# Patient Record
Sex: Male | Born: 1945 | Race: White | Hispanic: No | Marital: Married | Smoking: Never smoker
Health system: Southern US, Community
[De-identification: ages and names within clinical notes are randomized; demographics above are authoritative.]

## PROBLEM LIST (undated history)

## (undated) DIAGNOSIS — Z87442 Personal history of urinary calculi: Secondary | ICD-10-CM

## (undated) DIAGNOSIS — T8859XA Other complications of anesthesia, initial encounter: Secondary | ICD-10-CM

## (undated) DIAGNOSIS — M1990 Unspecified osteoarthritis, unspecified site: Secondary | ICD-10-CM

## (undated) DIAGNOSIS — Z960 Presence of urogenital implants: Secondary | ICD-10-CM

## (undated) DIAGNOSIS — R490 Dysphonia: Secondary | ICD-10-CM

## (undated) DIAGNOSIS — I10 Essential (primary) hypertension: Secondary | ICD-10-CM

## (undated) DIAGNOSIS — T4145XA Adverse effect of unspecified anesthetic, initial encounter: Secondary | ICD-10-CM

## (undated) DIAGNOSIS — K219 Gastro-esophageal reflux disease without esophagitis: Secondary | ICD-10-CM

## (undated) HISTORY — PX: PROSTATE SURGERY: SHX751

## (undated) HISTORY — PX: HERNIA REPAIR: SHX51

## (undated) HISTORY — PX: KNEE ARTHROSCOPY: SUR90

## (undated) HISTORY — PX: JOINT REPLACEMENT: SHX530

## (undated) HISTORY — PX: SHOULDER ARTHROSCOPY: SHX128

## (undated) HISTORY — PX: BACK SURGERY: SHX140

## (undated) HISTORY — PX: CYSTOSCOPY W/ URETEROSCOPY W/ LITHOTRIPSY: SUR380

## (undated) HISTORY — PX: SHOULDER ARTHROTOMY: SUR111

---

## 1997-07-24 ENCOUNTER — Other Ambulatory Visit: Admission: RE | Admit: 1997-07-24 | Discharge: 1997-07-24 | Payer: Self-pay | Admitting: Dermatology

## 1999-09-17 ENCOUNTER — Encounter: Admission: RE | Admit: 1999-09-17 | Discharge: 1999-09-17 | Payer: Self-pay | Admitting: General Surgery

## 1999-09-17 ENCOUNTER — Encounter (HOSPITAL_BASED_OUTPATIENT_CLINIC_OR_DEPARTMENT_OTHER): Payer: Self-pay | Admitting: General Surgery

## 1999-09-17 ENCOUNTER — Encounter (INDEPENDENT_AMBULATORY_CARE_PROVIDER_SITE_OTHER): Payer: Self-pay | Admitting: *Deleted

## 1999-09-17 ENCOUNTER — Ambulatory Visit (HOSPITAL_BASED_OUTPATIENT_CLINIC_OR_DEPARTMENT_OTHER): Admission: RE | Admit: 1999-09-17 | Discharge: 1999-09-17 | Payer: Self-pay | Admitting: General Surgery

## 2002-08-15 ENCOUNTER — Emergency Department (HOSPITAL_COMMUNITY): Admission: EM | Admit: 2002-08-15 | Discharge: 2002-08-15 | Payer: Self-pay | Admitting: Emergency Medicine

## 2003-05-24 ENCOUNTER — Inpatient Hospital Stay (HOSPITAL_COMMUNITY): Admission: EM | Admit: 2003-05-24 | Discharge: 2003-05-26 | Payer: Self-pay | Admitting: *Deleted

## 2003-11-21 ENCOUNTER — Ambulatory Visit: Payer: Self-pay | Admitting: Internal Medicine

## 2006-12-17 ENCOUNTER — Emergency Department (HOSPITAL_COMMUNITY): Admission: EM | Admit: 2006-12-17 | Discharge: 2006-12-17 | Payer: Self-pay | Admitting: Emergency Medicine

## 2006-12-27 ENCOUNTER — Ambulatory Visit (HOSPITAL_COMMUNITY): Admission: RE | Admit: 2006-12-27 | Discharge: 2006-12-27 | Payer: Self-pay | Admitting: Urology

## 2010-06-06 NOTE — H&P (Signed)
NAME:  Kevin [name], Kevin [name] NO.:  [id number]   MEDICAL RECORD NO.:  [id number]                   PATIENT TYPE:  INP   LOCATION:  1826                                 FACILITY:  [facility]   PHYSICIAN:  Hewitt Shorts, M.D.            DATE OF BIRTH:  04/17/1945   DATE OF ADMISSION:  05/24/2003  DATE OF DISCHARGE:                                HISTORY & PHYSICAL   HISTORY OF PRESENT ILLNESS:  The patient is a 65 year old right-handed white  male who presents for evaluation of acute low back and right lumbar  radicular pain.  I originally had evaluated the patient 9-1/2 years ago.  He  returned for reevaluation last month.  He has been having difficulties  dating back to the summer of 2004 with low back pain and spasm.  He has  undergone chiropractic treatment, but has continued to have difficulties  that have been essentially recurring episodes of low back pain and spasm.   His difficulties worsened once again this week.  He has contacted our office  several times because of incapacitating back pain and spasm with pain going  into the right buttock and lateral right thigh.  He has been immobilized in  bed, having to use a jar to urinate into.  He has been unable to get up to  go to the bathroom.  Because of the persistent severity of the pain, the  patient was brought by ambulance to the [facility] emergency room  and was evaluated at this time.   His only previous workup has been a CT scan of the lumbar spine and MRI has  been performed as of yet. The CT did show degenerative disk disease and  spondylosis with resulting stenosis at L3-4 and L4-5.   PAST MEDICAL HISTORY:  There is no history of hypertension, myocardial  infarction, cancer, stroke, diabetes, peptic ulcer disease, or lung disease.   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  Herniorrhaphy by Joanne Gavel, M.D. in August of 2002.   ALLERGIES:   No known drug allergies.   MEDICATIONS:  1. Vicodin p.r.n. for pain.  2. Flexeril p.r.n. for muscle spasms.   FAMILY HISTORY:  His father passed on at age 82, he had arthritis and died  of a heart attack. His mother is in poor health at age 23 with a history of  lung cancer.   SOCIAL HISTORY:  The patient is married.  He works as an Scientist, research (medical)  at [employer]. He does not smoke. He does drink alcohol socially.  He  denies history of substance abuse.   REVIEW OF SYSTEMS:  Notable for symptoms described in his history of present  illness and past medical history.  But his review of systems is otherwise  unremarkable.   PHYSICAL EXAMINATION:  GENERAL:  The patient is a well-developed, well-  nourished,  white male in no acute distress.  LUNGS:  Clear to auscultation.  He has symmetric respiratory excursion.  HEART:  Regular rate and rhythm, normal S1 and S2.  No murmur.  ABDOMEN:  Soft and nondistended.  Bowel sounds present.  EXTREMITIES:  No cyanosis, clubbing, or edema.  MUSCULOSKELETAL:  Limitations in mobility in the low back.  NEUROLOGY:  5/5 strength in the dorsiflexor and plantar flexors bilaterally  as well as the left extensor hallicis longus, however, the right extensor  hallicis longus is 4/5.  Sensation is intact to pinprick in the distal lower  extremities.  Reflexes are minimal at the quadriceps and gastrocnemius, but  symmetrical bilaterally.  Toes are downgoing bilaterally.  Gait and stance  were not tested.   IMPRESSION:  Acute low back and right lumbar radicular pain in a patient  with significant degenerative disk disease, spondylosis and stenosis at L3-4  and L4-5.  His only finding on examination is of weakness of the right  extensor hallicis longus.   PLAN:  The patient is being admitted for analgesics and muscle relaxants.  Will also plan on going ahead and obtaining an MRI of the lumbar spine and  be able to give him further  recommendations.                                                Hewitt Shorts, M.D.    RWN/MEDQ  D:  05/24/2003  T:  05/24/2003  Job:  [id number]

## 2010-06-06 NOTE — Op Note (Signed)
NAME:  Kevin [name], Kevin [name]                        ACCOUNT NO.:  [id number]   MEDICAL RECORD NO.:  [id number]                   PATIENT TYPE:  INP   LOCATION:  3018                                 FACILITY:  [facility]   PHYSICIAN:  Hewitt Shorts, M.D.            DATE OF BIRTH:  1945-06-04   DATE OF PROCEDURE:  05/25/2003  DATE OF DISCHARGE:                                 OPERATIVE REPORT   PREOPERATIVE DIAGNOSES:  1. Right L4-5 lumbar disk protrusion.  2. Lumbar degenerative disk disease.  3. Lumbar spondylosis.  4. Lumbar stenosis.   POSTOPERATIVE DIAGNOSES:  1. Right L4-5 lumbar disk protrusion.  2. Lumbar degenerative disk disease.  3. Lumbar spondylosis.  4. Lumbar stenosis.   PROCEDURE:  Right L4-5 lumbar laminotomy and microdiskectomy with  microdissection.   SURGEON:  Hewitt Shorts, M.D.   ASSISTANT:  Payton Doughty, M.D.   ANESTHESIA:  General endotracheal.   INDICATION:  The patient is a 65 year old man, who presented with an acute  right lumbar radiculopathy, was found by MRI scan to have underlying  degenerative disk disease and spondylosis with a resulting stenosis at L3-4  and L4-5, but he had a large central right L4-5 disk herniation, and the  decision was made to proceed with elective laminotomy and microdiskectomy.   DESCRIPTION OF PROCEDURE:  The patient brought to the operating room, placed  under general endotracheal anesthesia.  The patient was turned to a prone  position; lumbar region was prepped with DuraPrep and draped in a sterile  fashion.  The midline was infiltrated with local anesthetic with  epinephrine, and an x-ray was taken and the L4-5 level identified,  and a  midline incision was made, carried down through the subcutaneous tissue with  bipolar electrocautery.  Electrocautery used to maintain hemostasis.  Dissection was carried down to the lumbar fascia which was incised on the  right side of the midline and the paraspinous muscle was  dissected from the  spinous process of the lamina in a subperiosteal fashion.  The L4-5  interlaminar space was identified.  Another x-ray was taken, and then the  microscope was draped and brought onto the field to provide disk  magnification, illumination and visualization, and the remainder of the  decompression was performed using microdissection and microsurgical  technique.  A laminotomy was performed using the Black max drill and  Kerrison punches.  The ligamentum flavum was carefully resected.  We were  able to identify the thecal sac and right L5 nerve root.  These were gently  retracted medially.  Overlying epidural veins were coagulated, and we  identified the disk herniation.  The annulus was incised, and we entered  into the disk space and proceeded with thorough diskectomy.  As we were able  to do this, several free fragments of disk herniation that had migrated  caudally below the level of the disk space were removed, and we were  able to  decompress the exiting nerve root and the thecal sac caudal to the disk  space.  We then, within the disk space, proceeded with a thorough  diskectomy, removing all loose fragments of disk material and in the end,  all the fragments of disk material from both the disk space and the epidural  space were removed, and hemostasis was established with the use of bipolar  cautery.  The wound was irrigated with Bacitracin solution.  Check for  hemostasis was established, confirmed, and then we instilled 2 mL of  fentanyl and 80 mg of Depo-Medrol into the epidural space and proceeded with  closure.  The deep fascia closed with interrupted undyed #1 Vicryl suture,  the deep subcutaneous layer was closed with interrupted inverted 1 undyed  Vicryl sutures, and the subcutaneous and subcuticular were closed with  interrupted inverted 2-0 and 0 Vicryl sutures.  Skin is reapproximated with  Dermabond.  The patient tolerated the procedure well.  The  estimated blood  loss was 25 mL.  Sponge count correct.  Following surgery, the patient was  turned back to a supine position; we reversed anesthetic, extubated, and  transferred to the recovery room for further care.                                               Hewitt Shorts, M.D.    RWN/MEDQ  D:  05/25/2003  T:  05/27/2003  Job:  [id number]

## 2010-06-06 NOTE — Op Note (Signed)
[place]  Patient:    Kevin [name], Kevin [name]                    MRN: [id number] Proc. Date: 09/17/99 Attending:  Marnee Spring. Wiliam Ke, M.D.                           Operative Report  PREOPERATIVE DIAGNOSIS:  Left inguinal hernia.  POSTOPERATIVE DIAGNOSIS:  Left inguinal hernia.  OPERATION PERFORMED:  Repair of left inguinal hernia with Marlex patch and aspirate hydrocele.  SURGEON:  Marnee Spring. Wiliam Ke, M.D.  ASSISTANT:  None.  ANESTHESIA:  Local MAC.  DESCRIPTION OF PROCEDURE:  Under good sedation, the skin of the groin was prepped and draped in the usual manner.  Tissues were infiltrated with a mixture of Xylocaine and Marcaine anesthesia.  A transverse incision was made in the lower abdominal skin fold and deepened through subcutaneous tissue. The external oblique aponeurosis was opened, thus entering the inguinal canal. The ilioinguinal nerve was identified and held aside and spared.  Spermatic cord was mobilized and held aside.  Patient had a large lipoma of the cord and a sizable indirect sac.  They were freed from the spermatic cord.  The lipoma was tied off at its base with 2-0 silk and excised.  The hernia sac was opened and found to be empty and then suture ligated with 2-0 silk and tied with 2-0 silk and the excess sac was excised.  The patient had a sizable direct hernia. This was repaired by fashioning a Marlex patch and sewing this in place.  This was accomplished with two running 2-0 Novofil sutures both starting at the pubic tubercle, one running up the conjoined tendon, one running up the shelving portion of Pouparts ligament.  Both were tied at the internal ring and then a collaring suture of 2-0 Novofil was used.  Hemostasis was excellent.  The external oblique aponeurosis was closed superficial to the cord with running 2-0 Vicryl.  3-0 Vicryl was used in Scarpas fascia.  Skin was closed with subcutaneous 4-0 Vicryl and Steri-Strips were  applied. Estimated blood loss for this procedure was minimal.  At this point the testicle was found to be well in the scrotum and the hydrocele which we had known about before surgery and the patient did not wish operated on was aspirated and 10 cc of waterclear fluid was retrieved.  Dressings were applied.  Estimated blood loss was minimal.  The patient received no blood and left the operating room in satisfactory condition after sponge and needle counts were verified. DD:  09/17/99 TD:  09/17/99 Job: [id number]

## 2010-08-14 ENCOUNTER — Ambulatory Visit (HOSPITAL_BASED_OUTPATIENT_CLINIC_OR_DEPARTMENT_OTHER)
Admission: RE | Admit: 2010-08-14 | Discharge: 2010-08-15 | Disposition: A | Discharge status: Home / Self Care | Payer: 59 | Source: Ambulatory Visit | Attending: Urology | Admitting: Urology

## 2010-08-14 ENCOUNTER — Other Ambulatory Visit: Payer: Self-pay | Admitting: Urology

## 2010-08-14 DIAGNOSIS — Z01812 Encounter for preprocedural laboratory examination: Secondary | ICD-10-CM | POA: Insufficient documentation

## 2010-08-14 DIAGNOSIS — R9431 Abnormal electrocardiogram [ECG] [EKG]: Secondary | ICD-10-CM | POA: Insufficient documentation

## 2010-08-14 DIAGNOSIS — Z01810 Encounter for preprocedural cardiovascular examination: Secondary | ICD-10-CM | POA: Insufficient documentation

## 2010-08-14 DIAGNOSIS — N400 Enlarged prostate without lower urinary tract symptoms: Secondary | ICD-10-CM | POA: Insufficient documentation

## 2010-08-14 LAB — POCT I-STAT 4, (NA,K, GLUC, HGB,HCT)
Glucose, Bld: 105 mg/dL — ABNORMAL HIGH (ref 70–99)
HCT: 48 % (ref 39.0–52.0)
Hemoglobin: 16.3 g/dL (ref 13.0–17.0)
Potassium: 3.8 mEq/L (ref 3.5–5.1)
Sodium: 143 mEq/L (ref 135–145)

## 2010-09-19 NOTE — Op Note (Signed)
NAME:  Kevin, [name] NO.:  [id number]  MEDICAL RECORD NO.:  [id number]  LOCATION:                                 FACILITY:  PHYSICIAN:  Kevin [name] I. Kevin [name], M.D. DATE OF BIRTH:  DATE OF PROCEDURE:  08/14/2010 DATE OF DISCHARGE:                              OPERATIVE REPORT   TIME:  10:10 a.m.  PREOPERATIVE DIAGNOSIS:  Benign prostatic hypertrophy.  POSTOPERATIVE DIAGNOSIS:  Transurethral resection of prostate.  SURGEON:  Kevin [name] I. Kevin Sears, MD  ANESTHESIA:  General LMA.  PREPARATION:  After appropriate preanesthesia, the patient was brought to the operating room and placed on the operating room table in dorsal supine position where general LMA anesthesia was introduced.  He was then replaced in the dorsal lithotomy position where the pubis was prepped with Betadine solution and draped in usual fashion.  REVIEW OF HISTORY:  This 65 year old male has a history of severe bladder outlet obstruction, failing Rapaflo therapy and finasteride therapy.  He has complained of urinary urgency, frequency, with an International Prostate Symptom Score Sheet of 22.  He has been on Uroxatral and Avodart, but this was stopped because of decreased ejaculate volume in 2009.  He is known to have a 70 cc gland, with urodynamics showing a maximum flow rate of 4 cc a second, with a maximum capacity of 348 cc.  Pressure flow study showed that his flow rate was only 2 cc a second with a detrusor pressure of 91 cm of water and a PVR of 238 cc.  After extensive counseling, it was elected to proceed with TURP.  However, because of the patient's concern over ejaculatory problems, both with volume and antegrade ejaculation, we have discussed creating a "trench" and a little TURP as necessary to allow the patient to void.  The patient did have a TUNA procedure in the past but has currently failed that treatment.  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  This was dilated to a size 28-French, and the 28 continuous flow resectoscope was then passed into the urethra and into the bladder.  There was no urethral stricture noted.  A trilobar BPH was identified, with elevated bladder neck.  The bladder itself was trabeculated with cellules.  No bladder diverticula were identified.  There was a bladder stone identified which measured approximately 4 to 5 mm, seen at the base of the bladder and photo-documented.  Clear efflux was seen from both orifices, which were located normally on the trigone.  Resection of the bladder neck was accomplished from the 7 o'clock to the 5 o'clock positions.  A repeat evaluation showed that the patient had a very large left lobe, larger than the right lateral lobe, this was felt to be the most ascending obstructing portion of the prostate, with the bladder neck elevation was relieved.  I felt that the patient needed to have more than simply bladder neck incision, therefore, shaved the left lateral prostate, leaving as much tissue as I felt possible.  Minimal bleeding was noted, and this was cauterized with the electrosurgical unit.  The right lateral lobe was left intact.  The  verumontanum was always intact.  At the end of procedure, chips were evacuated from the bladder and the small stone was also evacuated from the bladder. Minimal bleeding was noted and I placed a 24 Ainsworth catheter with irrigation and mild traction.  The plan will be to remove the Foley catheter in 3 days, and give the patient voiding trial.  I am concerned because the patient may need to have a more complete TURP in the future, eliminating the right lateral lobe and the remaining left lateral lobe.  The patient was given IV acetaminophen, awakened at the beginning of procedure, as well as antibiotic, and a B and O suppository.  At the end of procedure, he was  awakened and taken to the recovery room in excellent condition.     Kevin [name], M.D.     SIT/MEDQ  D:  08/14/2010  T:  08/14/2010  Job:  [id number]  Electronically Signed by Kevin [name] M.D. on 09/19/2010 03:06:03 PM

## 2010-10-27 LAB — BASIC METABOLIC PANEL
BUN: 18
CO2: 28
Calcium: 9
Chloride: 102
Creatinine, Ser: 1.04
GFR calc Af Amer: >60
GFR calc non Af Amer: >60
Glucose, Bld: 106 — ABNORMAL HIGH
Potassium: 3.9
Sodium: 138

## 2010-10-28 LAB — URINALYSIS, ROUTINE W REFLEX MICROSCOPIC
Bilirubin Urine: NEGATIVE
Glucose, UA: NEGATIVE
Ketones, ur: NEGATIVE
Leukocytes, UA: NEGATIVE
Nitrite: NEGATIVE
Protein, ur: NEGATIVE
Specific Gravity, Urine: 1.038 — ABNORMAL HIGH
Urobilinogen, UA: 0.2
pH: 5.5

## 2010-10-28 LAB — URINE MICROSCOPIC-ADD ON

## 2010-10-28 LAB — POCT URINE HEMOGLOBIN
Hgb urine dipstick: POSITIVE — AB
Operator id: 173591

## 2011-03-05 ENCOUNTER — Encounter: Payer: Self-pay | Admitting: Gastroenterology

## 2011-11-19 ENCOUNTER — Other Ambulatory Visit: Payer: Self-pay | Admitting: Dermatology

## 2012-02-15 ENCOUNTER — Encounter: Payer: Self-pay | Admitting: Gastroenterology

## 2013-05-24 ENCOUNTER — Other Ambulatory Visit: Payer: Self-pay | Admitting: Urology

## 2013-05-29 ENCOUNTER — Encounter (HOSPITAL_COMMUNITY): Payer: Self-pay

## 2013-05-29 ENCOUNTER — Ambulatory Visit (HOSPITAL_COMMUNITY)
Admission: RE | Admit: 2013-05-29 | Discharge: 2013-05-29 | Disposition: A | Discharge status: Home / Self Care | Payer: 59 | Source: Ambulatory Visit | Attending: Anesthesiology | Admitting: Anesthesiology

## 2013-05-29 ENCOUNTER — Encounter (HOSPITAL_COMMUNITY)
Admission: RE | Admit: 2013-05-29 | Discharge: 2013-05-29 | Disposition: A | Discharge status: Home / Self Care | Payer: 59 | Source: Ambulatory Visit | Attending: Urology | Admitting: Urology

## 2013-05-29 ENCOUNTER — Encounter (INDEPENDENT_AMBULATORY_CARE_PROVIDER_SITE_OTHER): Payer: Self-pay

## 2013-05-29 ENCOUNTER — Encounter (HOSPITAL_COMMUNITY): Payer: Self-pay | Admitting: Pharmacy Technician

## 2013-05-29 HISTORY — DX: Personal history of urinary calculi: Z87.442

## 2013-05-29 HISTORY — DX: Essential (primary) hypertension: I10

## 2013-05-29 HISTORY — DX: Unspecified osteoarthritis, unspecified site: M19.90

## 2013-05-29 LAB — BASIC METABOLIC PANEL
BUN: 21 mg/dL (ref 6–23)
CO2: 29 mEq/L (ref 19–32)
Calcium: 9.5 mg/dL (ref 8.4–10.5)
Chloride: 102 mEq/L (ref 96–112)
Creatinine, Ser: 0.93 mg/dL (ref 0.50–1.35)
GFR calc Af Amer: >90 mL/min (ref >90)
GFR calc non Af Amer: 86 mL/min — ABNORMAL LOW (ref >90)
Glucose, Bld: 102 mg/dL — ABNORMAL HIGH (ref 70–99)
Potassium: 4.3 mEq/L (ref 3.7–5.3)
Sodium: 142 mEq/L (ref 137–147)

## 2013-05-29 LAB — CBC
HCT: 44.7 % (ref 39.0–52.0)
Hemoglobin: 15.7 g/dL (ref 13.0–17.0)
MCH: 30.4 pg (ref 26.0–34.0)
MCHC: 35.1 g/dL (ref 30.0–36.0)
MCV: 86.5 fL (ref 78.0–100.0)
Platelets: 126 10*3/uL — ABNORMAL LOW (ref 150–400)
RBC: 5.17 MIL/uL (ref 4.22–5.81)
RDW: 13 % (ref 11.5–15.5)
WBC: 6.8 10*3/uL (ref 4.0–10.5)

## 2013-05-29 NOTE — Pre-Procedure Instructions (Addendum)
05-29-13 EKG/ CXR done today. 05-29-13 Note to Dr. Patsi Searsannenbaum office -note labs viewable in Epic.

## 2013-05-29 NOTE — Patient Instructions (Addendum)
20 Clois ComberRobert E Sellin  05/29/2013   Your procedure is scheduled on:   05-30-2013  [facility] and follow signs to Short Stay Center. Arrive at    2:00     PM.  Call this number if you have problems the morning of surgery: [phone]  Or Presurgical Testing [phone](Jenefer Woerner) For Living Will and/or Health Care Power Attorney Forms: please provide copy for your medical record,may bring AM of surgery(Forms should be already notarized -we do not provide this service).(Pt. Has Living Will -will provide AM of 05-30-13).     Do not eat food:After Midnight.  May have clear liquids:up to 6 Hours before arrival. Nothing after : 1000 AM  Clear liquids include soda, tea, black coffee, apple or grape juice, broth.  Take these medicines the morning of surgery with A SIP OF WATER: Cephalexin, AM Acid reflux med   Do not wear jewelry, make-up or nail polish.  Do not wear lotions, powders, or perfumes. You may wear deodorant.  Do not shave 48 hours(2 days) prior to first CHG shower(legs and under arms).(Shaving face and neck okay.)  Do not bring valuables to the hospital.(Hospital is not responsible for lost valuables).  Contacts, dentures or removable bridgework, body piercing, hair pins may not be worn into surgery.  Leave suitcase in the car. After surgery it may be brought to your room.  For patients admitted to the hospital, checkout time is 11:00 AM the day of discharge.(Restricted visitors-Any Persons displaying flu-like symptoms or illness).    Patients discharged the day of surgery will not be allowed to drive home. Must have responsible person with you x 24 hours once discharged.  Name and phone number of your driver: Fatima SangerBeth Chesnut-spouse [phone] cell  Special Instructions: CHG(Chlorhedine 4%-"Hibiclens","Betasept","Aplicare") Shower Use Special Wash: see special instructions.(avoid face and genitals)    Failure to follow these instructions may result in  Cancellation of your surgery.   Patient signature_______________________________________________________

## 2013-05-29 NOTE — Progress Notes (Signed)
05-29-13 1520 -Labs viewable in Epic- note platelets.

## 2013-05-30 ENCOUNTER — Encounter (HOSPITAL_COMMUNITY)
Admission: RE | Disposition: A | Discharge status: Home / Self Care | Payer: Self-pay | Source: Ambulatory Visit | Attending: Urology

## 2013-05-30 ENCOUNTER — Encounter (HOSPITAL_COMMUNITY): Payer: 59 | Admitting: Anesthesiology

## 2013-05-30 ENCOUNTER — Encounter (HOSPITAL_COMMUNITY): Payer: Self-pay | Admitting: *Deleted

## 2013-05-30 ENCOUNTER — Ambulatory Visit (HOSPITAL_COMMUNITY): Payer: 59 | Admitting: Anesthesiology

## 2013-05-30 ENCOUNTER — Emergency Department (HOSPITAL_COMMUNITY)
Admission: EM | Admit: 2013-05-30 | Discharge: 2013-05-30 | Disposition: A | Discharge status: Home / Self Care | Payer: 59 | Attending: Emergency Medicine | Admitting: Emergency Medicine

## 2013-05-30 ENCOUNTER — Ambulatory Visit (HOSPITAL_COMMUNITY)
Admission: RE | Admit: 2013-05-30 | Discharge: 2013-05-30 | Disposition: A | Discharge status: Home / Self Care | Payer: 59 | Source: Ambulatory Visit | Attending: Urology | Admitting: Urology

## 2013-05-30 ENCOUNTER — Encounter (HOSPITAL_COMMUNITY): Payer: Self-pay | Admitting: Emergency Medicine

## 2013-05-30 DIAGNOSIS — M1610 Unilateral primary osteoarthritis, unspecified hip: Secondary | ICD-10-CM | POA: Insufficient documentation

## 2013-05-30 DIAGNOSIS — Y849 Medical procedure, unspecified as the cause of abnormal reaction of the patient, or of later complication, without mention of misadventure at the time of the procedure: Secondary | ICD-10-CM | POA: Insufficient documentation

## 2013-05-30 DIAGNOSIS — N401 Enlarged prostate with lower urinary tract symptoms: Secondary | ICD-10-CM | POA: Insufficient documentation

## 2013-05-30 DIAGNOSIS — N9989 Other postprocedural complications and disorders of genitourinary system: Secondary | ICD-10-CM | POA: Insufficient documentation

## 2013-05-30 DIAGNOSIS — N138 Other obstructive and reflux uropathy: Secondary | ICD-10-CM | POA: Insufficient documentation

## 2013-05-30 DIAGNOSIS — I10 Essential (primary) hypertension: Secondary | ICD-10-CM | POA: Insufficient documentation

## 2013-05-30 DIAGNOSIS — M169 Osteoarthritis of hip, unspecified: Secondary | ICD-10-CM | POA: Insufficient documentation

## 2013-05-30 DIAGNOSIS — R300 Dysuria: Secondary | ICD-10-CM | POA: Insufficient documentation

## 2013-05-30 DIAGNOSIS — IMO0002 Reserved for concepts with insufficient information to code with codable children: Secondary | ICD-10-CM | POA: Insufficient documentation

## 2013-05-30 DIAGNOSIS — N410 Acute prostatitis: Secondary | ICD-10-CM

## 2013-05-30 DIAGNOSIS — R339 Retention of urine, unspecified: Secondary | ICD-10-CM

## 2013-05-30 DIAGNOSIS — Z87891 Personal history of nicotine dependence: Secondary | ICD-10-CM | POA: Insufficient documentation

## 2013-05-30 DIAGNOSIS — N529 Male erectile dysfunction, unspecified: Secondary | ICD-10-CM | POA: Insufficient documentation

## 2013-05-30 DIAGNOSIS — K219 Gastro-esophageal reflux disease without esophagitis: Secondary | ICD-10-CM | POA: Insufficient documentation

## 2013-05-30 DIAGNOSIS — E291 Testicular hypofunction: Secondary | ICD-10-CM | POA: Insufficient documentation

## 2013-05-30 DIAGNOSIS — M19019 Primary osteoarthritis, unspecified shoulder: Secondary | ICD-10-CM | POA: Insufficient documentation

## 2013-05-30 DIAGNOSIS — Z87442 Personal history of urinary calculi: Secondary | ICD-10-CM | POA: Insufficient documentation

## 2013-05-30 DIAGNOSIS — R319 Hematuria, unspecified: Secondary | ICD-10-CM | POA: Insufficient documentation

## 2013-05-30 DIAGNOSIS — R109 Unspecified abdominal pain: Secondary | ICD-10-CM | POA: Insufficient documentation

## 2013-05-30 DIAGNOSIS — R310 Gross hematuria: Secondary | ICD-10-CM | POA: Insufficient documentation

## 2013-05-30 DIAGNOSIS — Z79899 Other long term (current) drug therapy: Secondary | ICD-10-CM | POA: Insufficient documentation

## 2013-05-30 DIAGNOSIS — Z978 Presence of other specified devices: Secondary | ICD-10-CM

## 2013-05-30 DIAGNOSIS — M129 Arthropathy, unspecified: Secondary | ICD-10-CM | POA: Insufficient documentation

## 2013-05-30 DIAGNOSIS — Z792 Long term (current) use of antibiotics: Secondary | ICD-10-CM | POA: Insufficient documentation

## 2013-05-30 HISTORY — DX: Presence of other specified devices: Z97.8

## 2013-05-30 HISTORY — PX: CYSTOSCOPY/RETROGRADE/URETEROSCOPY/STONE EXTRACTION WITH BASKET: SHX5317

## 2013-05-30 LAB — URINE MICROSCOPIC-ADD ON

## 2013-05-30 LAB — I-STAT CHEM 8, ED
BUN: 22 mg/dL (ref 6–23)
Calcium, Ion: 1.13 mmol/L (ref 1.13–1.30)
Chloride: 99 mEq/L (ref 96–112)
Creatinine, Ser: 1.2 mg/dL (ref 0.50–1.35)
Glucose, Bld: 142 mg/dL — ABNORMAL HIGH (ref 70–99)
HCT: 43 % (ref 39.0–52.0)
Hemoglobin: 14.6 g/dL (ref 13.0–17.0)
Potassium: 4.1 mEq/L (ref 3.7–5.3)
Sodium: 138 mEq/L (ref 137–147)
TCO2: 27 mmol/L (ref 0–100)

## 2013-05-30 LAB — URINALYSIS, ROUTINE W REFLEX MICROSCOPIC
Bilirubin Urine: NEGATIVE
Glucose, UA: NEGATIVE mg/dL
Ketones, ur: NEGATIVE mg/dL
Leukocytes, UA: NEGATIVE
Nitrite: NEGATIVE
Protein, ur: 100 mg/dL — AB
Specific Gravity, Urine: 1.012 (ref 1.005–1.030)
Urobilinogen, UA: 0.2 mg/dL (ref 0.0–1.0)
pH: 7 (ref 5.0–8.0)

## 2013-05-30 SURGERY — CYSTOSCOPY, WITH CALCULUS REMOVAL USING BASKET
Anesthesia: General | Site: Bladder

## 2013-05-30 MED ORDER — ATROPINE SULFATE 0.4 MG/ML IJ SOLN
INTRAMUSCULAR | Status: AC
  Filled 2013-05-30: qty 1

## 2013-05-30 MED ORDER — LIDOCAINE HCL 2 % EX GEL
CUTANEOUS | Status: AC
Start: 2013-05-30 — End: 2013-05-30
  Filled 2013-05-30: qty 10

## 2013-05-30 MED ORDER — PROPOFOL 10 MG/ML IV BOLUS
INTRAVENOUS | Status: AC
  Filled 2013-05-30: qty 20

## 2013-05-30 MED ORDER — LACTATED RINGERS IV SOLN: INTRAVENOUS | Status: DC

## 2013-05-30 MED ORDER — EPHEDRINE SULFATE 50 MG/ML IJ SOLN
INTRAMUSCULAR | Status: DC | PRN
  Administered 2013-05-30: 10 mg via INTRAVENOUS

## 2013-05-30 MED ORDER — PROPOFOL 10 MG/ML IV BOLUS
INTRAVENOUS | Status: DC | PRN
  Administered 2013-05-30: 200 mg via INTRAVENOUS
  Administered 2013-05-30: 40 mg via INTRAVENOUS

## 2013-05-30 MED ORDER — CEFAZOLIN SODIUM-DEXTROSE 2-3 GM-% IV SOLR
INTRAVENOUS | Status: AC
  Filled 2013-05-30: qty 50

## 2013-05-30 MED ORDER — LACTATED RINGERS IV SOLN
INTRAVENOUS | Status: DC
  Administered 2013-05-30: 1000 mL via INTRAVENOUS
  Administered 2013-05-30: 17:00:00 via INTRAVENOUS

## 2013-05-30 MED ORDER — OXYCODONE-ACETAMINOPHEN 5-325 MG PO TABS: 1.0000 | ORAL_TABLET | Freq: Four times a day (QID) | ORAL | Status: DC | PRN

## 2013-05-30 MED ORDER — LIDOCAINE HCL 2 % EX GEL
CUTANEOUS | Status: DC | PRN
  Administered 2013-05-30: 1

## 2013-05-30 MED ORDER — MIDAZOLAM HCL 5 MG/5ML IJ SOLN
INTRAMUSCULAR | Status: DC | PRN
  Administered 2013-05-30: 2 mg via INTRAVENOUS

## 2013-05-30 MED ORDER — FENTANYL CITRATE 0.05 MG/ML IJ SOLN: 25.0000 ug | INTRAMUSCULAR | Status: DC | PRN

## 2013-05-30 MED ORDER — PHENYLEPHRINE HCL 10 MG/ML IJ SOLN
INTRAMUSCULAR | Status: DC | PRN
  Administered 2013-05-30: 40 ug via INTRAVENOUS

## 2013-05-30 MED ORDER — MIDAZOLAM HCL 2 MG/2ML IJ SOLN
INTRAMUSCULAR | Status: AC
  Filled 2013-05-30: qty 2

## 2013-05-30 MED ORDER — FENTANYL CITRATE 0.05 MG/ML IJ SOLN
INTRAMUSCULAR | Status: AC
Start: 2013-05-30 — End: 2013-05-30
  Filled 2013-05-30: qty 5

## 2013-05-30 MED ORDER — URIBEL 118 MG PO CAPS: 1.0000 | ORAL_CAPSULE | Freq: Two times a day (BID) | ORAL | Status: DC | PRN

## 2013-05-30 MED ORDER — FENTANYL CITRATE 0.05 MG/ML IJ SOLN
INTRAMUSCULAR | Status: DC | PRN
  Administered 2013-05-30: 50 ug via INTRAVENOUS
  Administered 2013-05-30 (×2): 25 ug via INTRAVENOUS
  Administered 2013-05-30 (×2): 50 ug via INTRAVENOUS

## 2013-05-30 MED ORDER — LIDOCAINE HCL (CARDIAC) 20 MG/ML IV SOLN
INTRAVENOUS | Status: DC | PRN
  Administered 2013-05-30: 100 mg via INTRAVENOUS

## 2013-05-30 MED ORDER — DEXAMETHASONE SODIUM PHOSPHATE 10 MG/ML IJ SOLN
INTRAMUSCULAR | Status: DC | PRN
  Administered 2013-05-30: 10 mg via INTRAVENOUS

## 2013-05-30 MED ORDER — CEFAZOLIN SODIUM-DEXTROSE 2-3 GM-% IV SOLR
2.0000 g | INTRAVENOUS | Status: AC
  Administered 2013-05-30: 2 g via INTRAVENOUS

## 2013-05-30 MED ORDER — ONDANSETRON HCL 4 MG/2ML IJ SOLN
INTRAMUSCULAR | Status: DC | PRN
  Administered 2013-05-30: 4 mg via INTRAVENOUS

## 2013-05-30 MED ORDER — PHENYLEPHRINE 40 MCG/ML (10ML) SYRINGE FOR IV PUSH (FOR BLOOD PRESSURE SUPPORT)
PREFILLED_SYRINGE | INTRAVENOUS | Status: AC
  Filled 2013-05-30: qty 10

## 2013-05-30 MED ORDER — CEPHALEXIN 500 MG PO CAPS: 500.0000 mg | ORAL_CAPSULE | Freq: Two times a day (BID) | ORAL | Status: DC

## 2013-05-30 MED ORDER — BELLADONNA ALKALOIDS-OPIUM 16.2-60 MG RE SUPP
RECTAL | Status: AC
  Filled 2013-05-30: qty 1

## 2013-05-30 MED ORDER — SILODOSIN 8 MG PO CAPS: 8.0000 mg | ORAL_CAPSULE | Freq: Every day | ORAL | Status: DC

## 2013-05-30 SURGICAL SUPPLY — 19 items
BAG URO CATCHER STRL LF (DRAPE) ×3 IMPLANT
BASKET STNLS GEMINI 4WIRE 3FR (BASKET) IMPLANT
BASKET ZERO TIP NITINOL 2.4FR (BASKET) IMPLANT
BSKT STON RTRVL GEM 120X11 3FR (BASKET)
BSKT STON RTRVL ZERO TP 2.4FR (BASKET)
CATH INTERMIT  6FR 70CM (CATHETERS) ×3 IMPLANT
CLOTH BEACON ORANGE TIMEOUT ST (SAFETY) ×3 IMPLANT
DRAPE CAMERA CLOSED 9X96 (DRAPES) ×3 IMPLANT
GLOVE BIOGEL M STRL SZ7.5 (GLOVE) ×3 IMPLANT
GOWN STRL REUS W/TWL XL LVL3 (GOWN DISPOSABLE) ×3 IMPLANT
GUIDEWIRE STR DUAL SENSOR (WIRE) ×3 IMPLANT
IV NS 1000ML (IV SOLUTION) ×3
IV NS 1000ML BAXH (IV SOLUTION) ×1 IMPLANT
KIT ASPIRATION TUBING (SET/KITS/TRAYS/PACK) ×2 IMPLANT
LOOP ANGLED CUTTING 22FR (CUTTING LOOP) ×2 IMPLANT
MANIFOLD NEPTUNE II (INSTRUMENTS) ×3 IMPLANT
PACK CYSTO (CUSTOM PROCEDURE TRAY) ×3 IMPLANT
TUBING CONNECTING 10 (TUBING) ×2 IMPLANT
TUBING CONNECTING 10' (TUBING) ×1

## 2013-05-30 NOTE — ED Notes (Signed)
Leg bag hooked to catheter, given regular size catheter bag to take home also.

## 2013-05-30 NOTE — Discharge Instructions (Signed)
Prostatitis The prostate gland is about the size and shape of a walnut. It is located just below your bladder. It produces one of the components of semen, which is made up of sperm and the fluids that help nourish and transport it out from the testicles. Prostatitis is inflammation of the prostate gland.  There are four types of prostatitis:  Acute bacterial prostatitis This is the least common type of prostatitis. It starts quickly and usually is associated with a bladder infection, high fever, and shaking chills. It can occur at any age.  Chronic bacterial prostatitis This is a persistent bacterial infection in the prostate. It usually develops from repeated acute bacterial prostatitis or acute bacterial prostatitis that was not properly treated. It can occur in men of any age but is most common in middle-aged men whose prostate has begun to enlarge. The symptoms are not as severe as those in acute bacterial prostatitis. Discomfort in the part of your body that is in front of your rectum and below your scrotum (perineum), lower abdomen, or in the head of your penis (glans) may represent your primary discomfort.  Chronic prostatitis (nonbacterial) This is the most common type of prostatitis. It is inflammation of the prostate gland that is not caused by a bacterial infection. The cause is unknown and may be associated with a viral infection or autoimmune disorder.  Prostatodynia (pelvic floor disorder) This is associated with increased muscular tone in the pelvis surrounding the prostate. CAUSES The causes of bacterial prostatitis are bacterial infection. The causes of the other types of prostatitis are unknown.  SYMPTOMS  Symptoms can vary depending upon the type of prostatitis that exists. There can also be overlap in symptoms. Possible symptoms for each type of prostatitis are listed below. Acute Bacterial Prostatitis  Painful urination.  Fever or chills.  Muscle or joint pains.  Low  back pain.  Low abdominal pain.  Inability to empty bladder completely. Chronic Bacterial Prostatitis, Chronic Nonbacterial Prostatitis, and Prostatodynia  Sudden urge to urinate.  Frequent urination.  Difficulty starting urine stream.  Weak urine stream.  Discharge from the urethra.  Dribbling after urination.  Rectal pain.  Pain in the testicles, penis, or tip of the penis.  Pain in the perineum.  Problems with sexual function.  Painful ejaculation.  Bloody semen. DIAGNOSIS  In order to diagnose prostatitis, your health care provider will ask about your symptoms. One or more urine samples will be taken and tested (urinalysis). If the urinalysis result is negative for bacteria, your health care provider may use a finger to feel your prostate (digital rectal exam). This exam helps your health care provider determine if your prostate is swollen and tender. It will also produce a specimen of semen that can be analyzed. TREATMENT  Treatment for prostatitis depends on the cause. If a bacterial infection is the cause, it can be treated with antibiotic medicine. In cases of chronic bacterial prostatitis, the use of antibiotics for up to 1 month or 6 weeks may be necessary. Your health care provider may instruct you to take sitz baths to help relieve pain. A sitz bath is a bath of hot water in which your hips and buttocks are under water. This relaxes the pelvic floor muscles and often helps to relieve the pressure on your prostate. HOME CARE INSTRUCTIONS   Take all medicines as directed by your health care provider.  Take sitz baths as directed by your health care provider. SEEK MEDICAL CARE IF:   Your symptoms   get worse, not better.  You have a fever. SEEK IMMEDIATE MEDICAL CARE IF:   You have chills.  You feel nauseous or vomit.  You feel lightheaded or faint.  You are unable to urinate.  You have blood or blood clots in your urine. Document Released: 01/03/2000  Document Revised: 10/26/2012 Document Reviewed: 07/25/2012 [employer] Patient Information 2014 [employer].  

## 2013-05-30 NOTE — Interval H&P Note (Signed)
History and Physical Interval Note:  05/30/2013 4:02 PM  Kevin Comberobert E [name]  has presented today for surgery, with the diagnosis of BLADDER TUMOR   The various methods of treatment have been discussed with the patient and family. After consideration of risks, benefits and other options for treatment, the patient has consented to  Procedure(s): CYSTOSCOPY WITH POSSIBLE BLADDER BIOPSY AND CLOT EVACUATION  (N/A) as a surgical intervention .  The patient's history has been reviewed, patient examined, no change in status, stable for surgery.  I have reviewed the patient's chart and labs.  Questions were answered to the patient's satisfaction.     Kathi LudwigSigmund I Javier Gell

## 2013-05-30 NOTE — Anesthesia Preprocedure Evaluation (Addendum)
Anesthesia Evaluation  Patient identified by MRN, date of birth, ID band Patient awake    Reviewed: Allergy & Precautions, H&P , NPO status , Patient's Chart, lab work & pertinent test results  Airway Mallampati: II TM Distance: >3 FB Neck ROM: full    Dental no notable dental hx. (+) Teeth Intact, Dental Advisory Given   Pulmonary neg pulmonary ROS,  breath sounds clear to auscultation  Pulmonary exam normal       Cardiovascular Exercise Tolerance: Good hypertension, Pt. on medications Rhythm:regular Rate:Normal     Neuro/Psych negative neurological ROS  negative psych ROS   GI/Hepatic negative GI ROS, Neg liver ROS, GERD-  Controlled and Medicated,  Endo/Other  negative endocrine ROS  Renal/GU negative Renal ROS  negative genitourinary   Musculoskeletal   Abdominal   Peds  Hematology negative hematology ROS (+)   Anesthesia Other Findings   Reproductive/Obstetrics negative OB ROS                          Anesthesia Physical Anesthesia Plan  ASA: II  Anesthesia Plan: General   Post-op Pain Management:    Induction: Intravenous  Airway Management Planned: LMA  Additional Equipment:   Intra-op Plan:   Post-operative Plan:   Informed Consent: I have reviewed the patients History and Physical, chart, labs and discussed the procedure including the risks, benefits and alternatives for the proposed anesthesia with the patient or authorized representative who has indicated his/her understanding and acceptance.   Dental Advisory Given  Plan Discussed with: CRNA and Surgeon  Anesthesia Plan Comments:         Anesthesia Quick Evaluation

## 2013-05-30 NOTE — ED Provider Notes (Signed)
CSN: [id number]     Arrival date & time 05/30/13  2158 History   First MD Initiated Contact with Patient 05/30/13 2216     Chief Complaint  Patient presents with  . Urinary Retention     (Consider location/radiation/quality/duration/timing/severity/associated sxs/prior Treatment) HPI Kevin [name] is a 68 y.o. male who presents to ED with difficulty urinating. States he had a procedure done this morning, by dr. Patsi Searsannenbaum, who performed a cystoscope with 'two clots removed.' pt states he had difficulty urinating after procedure, but states was able to urinate a small amount, and was discharged home. States he has not been able to urinate since. States it has now been close to 4 hrs since last urinating. Pt states he has been drinking lots of fluids and took a pill that is supposed to increase his flow. States feels like he has a large amount of urine in bladder but unable to empty it. Reports bloody urine prior to retention. Reports suprapubic discomfort. Denies any back pain. No nausea, vomiting. No fever or chills. No medications or treatments tried.   Past Medical History  Diagnosis Date  . Hypertension   . History of kidney stones     x1  . Arthritis     osteoarthritis-hips/ shoulders- no issues at present   Past Surgical History  Procedure Laterality Date  . Joint replacement      '00-left/'08 RTHA  . Back surgery      lumbar   . Shoulder arthroscopy Right   . Shoulder arthrotomy Left   . Hernia repair      inguinal  . Prostate surgery Bilateral     7- 8 yrs ago  . Cystoscopy w/ ureteroscopy w/ lithotripsy     No family history on file. History  Substance Use Topics  . Smoking status: Never Smoker   . Smokeless tobacco: Not on file  . Alcohol Use: Yes     Comment: social 1-2 drinks weekly    Review of Systems  Constitutional: Negative for fever and chills.  Respiratory: Negative for cough, chest tightness and shortness of breath.   Cardiovascular: Negative for  chest pain, palpitations and leg swelling.  Gastrointestinal: Positive for abdominal pain. Negative for nausea, vomiting, diarrhea and abdominal distention.  Genitourinary: Positive for hematuria and difficulty urinating. Negative for dysuria, urgency, frequency, discharge, penile swelling, scrotal swelling, penile pain and testicular pain.  Musculoskeletal: Negative for arthralgias and myalgias.  Skin: Negative for rash.  Allergic/Immunologic: Negative for immunocompromised state.  Neurological: Negative for dizziness, weakness, light-headedness, numbness and headaches.  All other systems reviewed and are negative.     Allergies  Review of patient's allergies indicates no known allergies.  Home Medications   Prior to Admission medications   Medication Sig Start Date End Date Taking? Authorizing Provider  cephALEXin (KEFLEX) 500 MG capsule Take 500 mg by mouth 2 (two) times daily.   Yes Historical Provider, MD  esomeprazole (NEXIUM) 40 MG capsule Take 40 mg by mouth every morning.   Yes Historical Provider, MD  glucosamine-chondroitin 500-400 MG tablet Take 1 tablet by mouth 3 (three) times daily.   Yes Historical Provider, MD  lisinopril (PRINIVIL,ZESTRIL) 40 MG tablet Take 40 mg by mouth every morning.   Yes Historical Provider, MD  Meth-Hyo-M Bl-Na Phos-Ph Sal (URIBEL) 118 MG CAPS Take 1 capsule (118 mg total) by mouth 3 times/day as needed-between meals & bedtime. 05/30/13  Yes Sigmund Bridget HartshornI Tannenbaum, MD  Misc Natural Products (WHITE WILLOW BARK PO) Take 1 tablet  by mouth daily.   Yes Historical Provider, MD  naproxen sodium (ANAPROX) 220 MG tablet Take 440 mg by mouth 2 (two) times daily with a meal.   Yes Historical Provider, MD  omega-3 acid ethyl esters (LOVAZA) 1 G capsule Take 1 g by mouth daily.   Yes Historical Provider, MD  ranitidine (ZANTAC) 300 MG tablet Take 300 mg by mouth at bedtime.   Yes Historical Provider, MD  silodosin (RAPAFLO) 8 MG CAPS capsule Take 1 capsule (8 mg  total) by mouth daily with breakfast. 05/30/13  Yes Kathi Ludwig, MD  TURMERIC PO Take 1,000 mg by mouth daily.   Yes Historical Provider, MD  oxyCODONE-acetaminophen (ROXICET) 5-325 MG per tablet Take 1 tablet by mouth every 6 (six) hours as needed for severe pain. 05/30/13   Kathi Ludwig, MD   BP 149/74  Pulse 94  Temp(Src) 98.4 F (36.9 C) (Oral)  Resp 16  SpO2 96% Physical Exam  Nursing note and vitals reviewed. Constitutional: He is oriented to person, place, and time. He appears well-developed and well-nourished. No distress.  HENT:  Head: Normocephalic and atraumatic.  Eyes: Conjunctivae are normal.  Neck: Neck supple.  Cardiovascular: Normal rate, regular rhythm and normal heart sounds.   Pulmonary/Chest: Effort normal. No respiratory distress. He has no wheezes. He has no rales.  Abdominal: Soft. Bowel sounds are normal. He exhibits no distension. There is no tenderness. There is no rebound.  Genitourinary:  Foley in place, draining blood urine  Musculoskeletal: He exhibits no edema.  Neurological: He is alert and oriented to person, place, and time.  Skin: Skin is warm and dry.    ED Course  Procedures (including critical care time) Labs Review Labs Reviewed  URINALYSIS, ROUTINE W REFLEX MICROSCOPIC - Abnormal; Notable for the following:    Color, Urine RED (*)    APPearance TURBID (*)    Hgb urine dipstick LARGE (*)    Protein, ur 100 (*)    All other components within normal limits  I-STAT CHEM 8, ED - Abnormal; Notable for the following:    Glucose, Bld 142 (*)    All other components within normal limits  URINE MICROSCOPIC-ADD ON    Imaging Review Dg Chest 2 View  05/29/2013   CLINICAL DATA:  Preoperative chest radiograph. Bladder tumor removal.  EXAM: CHEST  2 VIEW  COMPARISON:  None available.  FINDINGS: Prominent vessels are present in the left suprahilar region. There is no airspace disease or pleural effusion. Cardiopericardial silhouette  is within normal limits.  IMPRESSION: No active cardiopulmonary disease.   Electronically Signed   By: Andreas Newport M.D.   On: 05/29/2013 14:18     EKG Interpretation None      MDM   Final diagnoses:  Urinary retention  Hematuria   Patient in emergency department with urinary retention after a procedure today. Bladder scan revealed greater than 999 cc of urine in the bladder. A Foley catheter placed. 1 L of bloody urine in Foley bag. Patient is feeling much better. I-STAT chem 8 obtained, unremarkable. Urinalysis showed large hemoglobin and red-colored urine, no signs of infection. Patient is hypertensive otherwise normal vital signs. We'll plan to discharge home with followup with urology tomorrow. A Foley leg bag provided.  Filed Vitals:   05/30/13 2216 05/30/13 2252 05/30/13 2300  BP: 220/109 149/74 135/71  Pulse: 99 94 82  Temp: 98.4 F (36.9 C)    TempSrc: Oral    Resp: 20 16  SpO2: 96% 96% 92%      Lottie Musselatyana A Kazuma Elena, PA-C 05/31/13 0007

## 2013-05-30 NOTE — H&P (Signed)
Reason For Visit Cystoscopy   Active Problems Problems  1. 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 Elevated prostate specific antigen (PSA) (790.93) 3. 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 Gross hematuria (599.71)   Assessed By: Jethro Bolusannenbaum, Antwone Capozzoli (Urology); Last Assessed: 23 May 2013 5. 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 He was last seen by Seward GraterNadine Nguyen, NP on 02/13/13 for 3- day hx of painless intermittent dark blood w/ clots, but now intermittent. First couple of time, urine had cranberry red color but now urine is pink color at the beginning then clears up at the end of the stream, no clots. He had been taking fish oil and herbal and had stopped taking both of these when he first saw gross hematuria. Denies fever/chills/nausea/vomiting. Denies any associate voiding discomfort. He had experienced an episode of gross hematuria post TURP in 2012 but resolved w/o interventions. He used to smoke cigarette on occasions and had quitted smoking 35 yrs ago. Denies hx of exposure to chemical. He does have hx of stone and had undergone right ESWL in 2008.    He just had shoulder surgery for dislocation on 02/09/13.    1) Hx of BPH with LUTS: severe bladder outlet symptoms, failing Rapaflo therapy,  Hx of urgency, frequency, with an international prostate symptom score of 22 on Uroxatral. He also has a history of Avodart complication because of decreased ejaculate volume of 2009. He had a 70cc gland and underwent a bladder neck incision and TUIR of lateral lobe of prostate in 2012 w/ benign path. He has  been voiding well ever since that time.     2) ED, hypogonadism, and dysplastic prostate. He restarted his Androgel following prostate biopsy on 12/22/11 for hx of an elevated PSA of 7.187 on 11/23/11 per Dr. Wylene Simmerisovec. Path showed 1 area of HgPIN, and his elevated psa was thought 2ndary BPH and Testosterone effect. Elevated psa was treated with Cipro to see if this is possibly related to prostatitis. Testopel #16 insertion on 06/05/11 but testosterone was still low 274 during Nov 2014 visit. Therefore, androgel double dose was started and testosterone level improved to 747 during most recent visit in Dec 2014.  11/01/12 PSA 4.73/17%         HCT 44.9%         Hgb 16    Interval hx:    Mr. Kevin [name] returns today w/ c/o .   Past Medical History Problems  1. History of Arthritis (V13.4) 2. 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 History of hypertension (V12.59)  Surgical History Problems  1. History of Back Surgery 2. History of Inguinal Hernia Repair 3. History of Lithotripsy 4. History of Shoulder Surgery 5. History of Shoulder Surgery 6. History of Total Hip Replacement 7. History of Transurethral Resection Of Prostate (TURP)  Current Meds 1. Aleve TABS;  Therapy: (Recorded:26Mar2008) to Recorded 2. AndroGel 40.5 MG/2.5GM (1.62%) Transdermal Gel; apply to AREA AS  DIRECTED;  Therapy: 09Dec2013 to (Evaluate:22Dec2014)  Requested for:  25Jun2014; Last Rx:25Jun2014 Ordered 3. AndroGel Pump 20.25 MG/ACT (1.62%) Transdermal Gel; Apply 4 pumps  daily to upper  arms/chest area;  Therapy: 21Oct2014 to (Last Rx:03Dec2014) Ordered 4. Cialis 20 MG Oral Tablet; TAKE 1 TABLET As Directed;  Therapy: 03Nov2008 to (Last Rx:23Apr2014)  Requested for: 23Apr2014  Ordered 5. Glucosamine CAPS;  Therapy: (Recorded:26Mar2008) to Recorded 6. GNP Fish Oil CAPS;  Therapy: (Recorded:26Mar2008) to Recorded 7. Lisinopril 10 MG Oral Tablet;  Therapy: 08Nov2011 to Recorded 8. Omeprazole 40 MG Oral  Capsule Delayed Release;  Therapy: 09Feb2012 to Recorded 9. Oxycodone-Acetaminophen TABS;  Therapy: (Recorded:26Jan2015) to Recorded 10. TraMADol HCl - 50 MG Oral Tablet; Uses rarely, only prn;   Therapy: 30Mar2012 to Recorded 11. V-R Vitamin E CAPS;   Therapy: (Recorded:26Mar2008) to Recorded  Allergies Medication  1. Ciprofloxacin HCl TABS  Family History Problems  1. No pertinent family history  Social History Problems  1. Alcohol Use 2. Caffeine Use   3 PER DAY 3. Marital History - Currently Married 4. Never A Smoker 5. Occupation:   Civil engineer, contractingWEALTH ADVISOR  Review of Systems  Genitourinary: hematuria, but no urinary frequency and no feelings of urinary urgency.    Vitals Vital Signs [Data Includes: Last 1 Day]  Recorded: 05May2015 10:42AM  Blood Pressure: 223 / 87 Temperature: 98.5 F Heart Rate: 67  Results/Data Urine [Data Includes: Last 1 Day]   05May2015  COLOR YELLOW   APPEARANCE CLEAR   SPECIFIC GRAVITY 1.020   pH 7.0   GLUCOSE NEG mg/dL  BILIRUBIN NEG   KETONE NEG mg/dL  BLOOD LARGE   PROTEIN TRACE mg/dL  UROBILINOGEN 0.2 mg/dL  NITRITE NEG   LEUKOCYTE ESTERASE NEG   SQUAMOUS EPITHELIAL/HPF NONE SEEN   WBC NONE SEEN WBC/hpf  RBC 21-50 RBC/hpf  BACTERIA MODERATE   CRYSTALS NONE SEEN   CASTS NONE SEEN   Selected Results  AU CT-HEMATURIA PROTOCOL 03Feb2015 12:00AM Seward GraterNguyen, Nadine   Test Name Result Flag Reference  AU CT-HEMATURIA PROTOCOL (Report)    ** RADIOLOGY REPORT BY [facility] **   CLINICAL DATA: Gross hematuria.  EXAM: CT ABDOMEN AND PELVIS WITHOUT AND WITH CONTRAST  TECHNIQUE: Multidetector CT imaging of the abdomen and pelvis was performed without contrast material in one or both body regions, followed by contrast material(s) and further sections in one or both body regions.  CONTRAST: 125 cc of Isovue-300  COMPARISON: CT scan dated 12/17/2006  FINDINGS: Precontrast images demonstrate to a 10 mm stone in the mid  right kidney and a 2 mm stone in the upper pole of the left kidney, 2 mm stone in the mid left kidney, and any 7 mm stone in the lower pole of the left kidney. There are no mass lesions. There is no hydronephrosis. The ureters appear normal. The visualized portion of the bladder is normal. The hip prostheses obscure portions of the bladder. The prostate gland is enlarged.  There are several benign appearing cysts in the liver. There is slight hepatic steatosis. Biliary tree, spleen, pancreas, and adrenal glands are normal. There are numerous diverticula in the left side of the colon. Bowel is otherwise normal including the terminal ileum and appendix.  There is no adenopathy. No free air or free fluid. No acute osseous abnormality.  IMPRESSION: 1. Bilateral renal calculi. 2. Enlarged prostate gland. 3. Diverticulosis.   Electronically Signed  By: Geanie CooleyJim Maxwell M.D.  On: 02/21/2013 16:50   Procedure  Procedure: Cystoscopy  Chaperone Present: kim lewis.  Indication: Hematuria.  Informed Consent: Risks, benefits, and potential adverse events were discussed and informed consent was obtained from the patient.  Prep: The patient was prepped  with betadine.  Anesthesia:. Local anesthesia was administered intraurethrally with 2% lidocaine jelly.  Antibiotic prophylaxis:. Keflex.  Procedure Note:  Urethral meatus:. No abnormalities.  Anterior urethra: No abnormalities.  Prostatic urethra:. The lateral prostatic lobes were enlarged. An enlarged intravesical median lobe was visualized.  Bladder: Visulization was clear. Examination of the bladder demonstrated a moderate amount of old clot within the bladder and trabeculation, but no diverticulum. 2 moderate sized organized clots The patient tolerated the procedure well.  Complications: None.    Assessment Assessed  1. Gross hematuria (599.71)  Gross hematuria with organized clots. ? BT underneath. He needs to have clots removed, and  then possible bladder biopsy. OP at [facility].   Plan Gross hematuria  1. Start: Cephalexin 500 MG Oral Capsule (Keflex); TAKE 1 CAPSULE BID Health Maintenance  2. UA With REFLEX; [Do Not Release]; Status:Resulted - Requires  Verification;   Done: 05May2015 10:15AM Hypogonadism, testicular  3. Stop: AndroGel 40.5 MG/2.5GM (1.62%) Transdermal Gel  Schedule surgical intervention for next week.   Signatures Electronically signed by : Jethro Bolus, M.D.; May 23 2013 12:25PM EST

## 2013-05-30 NOTE — Anesthesia Postprocedure Evaluation (Signed)
  Anesthesia Post-op Note  Patient: Kevin [name]  Procedure(s) Performed: Procedure(s) (LRB): CYSTOSCOPY , BLADDER BIOPSY AND CLOT EVACUATION , CAUTERIZATIO OF LATERAL RIGHT AND LEFT LOBE OF PROSTATE (N/A)  Patient Location: PACU  Anesthesia Type: General  Level of Consciousness: awake and alert   Airway and Oxygen Therapy: Patient Spontanous Breathing  Post-op Pain: mild  Post-op Assessment: Post-op Vital signs reviewed, Patient's Cardiovascular Status Stable, Respiratory Function Stable, Patent Airway and No signs of Nausea or vomiting  Last Vitals:  Filed Vitals:   05/30/13 1745  BP: 158/72  Pulse:   Temp: 36.4 C  Resp: 16    Post-op Vital Signs: stable   Complications: No apparent anesthesia complications

## 2013-05-30 NOTE — ED Notes (Signed)
Pt states had 2 blood clots removed from his prostate today, since has voided very little and states the past 2 hours has not voided, bladder scan showed >999, pt uncomfortable in room at this time.

## 2013-05-30 NOTE — Discharge Instructions (Signed)
Drink plenty of fluids. Follow up with urology, call them tomorrow. Return if foley not flowing or if develop fever, worsening pain.    Acute Urinary Retention, Male Acute urinary retention is the temporary inability to urinate. This is a common problem in older men. As men age their prostates become larger and block the flow of urine from the bladder. This is usually a problem that has come on gradually.  HOME CARE INSTRUCTIONS If you are sent home with a Foley catheter and a drainage system, you will need to discuss the best course of action with your health care provider. While the catheter is in, maintain a good intake of fluids. Keep the drainage bag emptied and lower than your catheter. This is so that contaminated urine will not flow back into your bladder, which could lead to a urinary tract infection. There are two main types of drainage bags. One is a large bag that usually is used at night. It has a good capacity that will allow you to sleep through the night without having to empty it. The second type is called a leg bag. It has a smaller capacity, so it needs to be emptied more frequently. However, the main advantage is that it can be attached by a leg strap and can go underneath your clothing, allowing you the freedom to move about or leave your home. Only take over-the-counter or prescription medicines for pain, discomfort, or fever as directed by your health care provider.  SEEK MEDICAL CARE IF:  You develop a low-grade fever.  You experience spasms or leakage of urine with the spasms. SEEK IMMEDIATE MEDICAL CARE IF:   You develop chills or fever.  Your catheter stops draining urine.  Your catheter falls out.  You start to develop increased bleeding that does not respond to rest and increased fluid intake. MAKE SURE YOU:  Understand these instructions.  Will watch your condition.  Will get help right away if you are not doing well or get worse. Document Released:  04/13/2000 Document Revised: 09/07/2012 Document Reviewed: 06/16/2012 [facility] Patient Information 2014 [employer] [place].

## 2013-05-30 NOTE — Op Note (Signed)
Pre-operative diagnosis :   Gross hematuria, BPH.  Postoperative diagnosis:  Same plus prostatitis  Operation:  Cystourethroscopy, evacuation of clot in the bladder and prostatic fossa, right lateral lobe prostate biopsy, cauterization of right and left lateral lobe the prostate.  Surgeon:  Kathie RhodesS. Patsi Searsannenbaum, MD  First assistant:  None  Anesthesia:  General  LMA  Preparation:  After appropriate preanesthesia, the patient was brought to the operating room, placed on the operating table in the dorsal supine position where general LMA anesthesia was introduced. He was then replaced in the dorsal lithotomy position with pubis was prepped with Betadine solution and draped in usual fashion. The armband was double checked. The history was double checked.  Review history:  Problems  1. Benign prostatic hyperplasia with urinary obstruction (600.01,599.69)   Assessed By: Jethro Bolusannenbaum, Xandra Laramee (Urology); Last Assessed: 11 May 2012  2. Elevated prostate specific antigen (PSA) (790.93)  3. 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 Gross hematuria (599.71)   Assessed By: Jethro Bolusannenbaum, Mahmoud Blazejewski (Urology); Last Assessed: 23 May 2013  5. 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 He was last seen by Seward GraterNadine Nguyen, NP on 02/13/13 for 3- day hx of painless intermittent dark blood w/ clots, but now intermittent. First couple of time, urine had cranberry red color but now urine is pink color at the beginning then clears up at the end of the stream, no clots. He had been taking fish oil and herbal and had stopped taking both of these when he first saw gross hematuria. Denies fever/chills/nausea/vomiting. Denies any associate voiding discomfort. He had  experienced an episode of gross hematuria post TURP in 2012 but resolved w/o interventions. He used to smoke cigarette on occasions and had quitted smoking 35 yrs ago. Denies hx of exposure to chemical. He does have hx of stone and had undergone right ESWL in 2008.   Statement of  Likelihood of Success: Excellent. TIME-OUT observed.:  Procedure:  The urethra was dilated to a size 7928 [place] with the male sounds. Fundus the 28 continuous flow sheath was placed within the bladder. A moderate amount of fresh and old clot was identified, and irrigated free from the bladder. No bladder tumor was identified. Trabeculation and some formation was identified. The patient was status post TUIP, and this was well-healed. Edema and inflammatory polyps of the right and left lateral lobe the prostate were identified, with photodocumentation of inflammatory polyps. Biopsy of the right lateral lobe the prostate was obtained, and cauterization of the right and left prostate lobes was accomplished. No bleeding was noted. I elected to not leave the Foley catheter, in order to seat the patient was able to void in the recovery room. The bladder is drained of fluid. Xylocaine gel was placed in the ureter. The patient was awakened and taken to recovery room in good condition.    Time out for the specimen was accomplished and the specimen was handed off appropriately.

## 2013-05-30 NOTE — Transfer of Care (Signed)
Immediate Anesthesia Transfer of Care Note  Patient: Clois ComberRobert E Hoback  Procedure(s) Performed: Procedure(s) (LRB): CYSTOSCOPY WITH POSSIBLE BLADDER BIOPSY AND CLOT EVACUATION  (N/A)  Patient Location: PACU  Anesthesia Type: General  Level of Consciousness: sedated, patient cooperative and responds to stimulation  Airway & Oxygen Therapy: Patient Spontanous Breathing and Patient connected to face mask oxgen  Post-op Assessment: Report given to PACU RN and Post -op Vital signs reviewed and stable  Post vital signs: Reviewed and stable  Complications: No apparent anesthesia complications

## 2013-05-31 ENCOUNTER — Encounter (HOSPITAL_COMMUNITY): Payer: Self-pay | Admitting: Urology

## 2013-05-31 NOTE — ED Provider Notes (Signed)
Medical screening examination/treatment/procedure(s) were performed by non-physician practitioner and as supervising physician I was immediately available for consultation/collaboration.   Skarlett Sedlacek T Adalaide Jaskolski, MD 05/31/13 2322 

## 2013-06-01 ENCOUNTER — Encounter (HOSPITAL_BASED_OUTPATIENT_CLINIC_OR_DEPARTMENT_OTHER): Admission: RE | Payer: Self-pay | Source: Ambulatory Visit

## 2013-06-01 ENCOUNTER — Ambulatory Visit (HOSPITAL_BASED_OUTPATIENT_CLINIC_OR_DEPARTMENT_OTHER): Admission: RE | Admit: 2013-06-01 | Payer: 59 | Source: Ambulatory Visit | Admitting: Urology

## 2013-06-01 SURGERY — CYSTOSCOPY, WITH BIOPSY
Anesthesia: General

## 2013-06-06 ENCOUNTER — Other Ambulatory Visit: Payer: Self-pay | Admitting: Urology

## 2013-06-06 ENCOUNTER — Encounter (HOSPITAL_COMMUNITY): Payer: Self-pay | Admitting: Pharmacy Technician

## 2013-06-07 ENCOUNTER — Encounter (HOSPITAL_COMMUNITY): Payer: Self-pay | Admitting: *Deleted

## 2013-06-07 NOTE — Progress Notes (Signed)
PREOP INSTRUCTIONS AND CHLORHEXIDINE INSTRUCTIONS / PRECAUTIONS REVIEWED WITH PT BY PHONE.  PT JUST HAD SURGERY 05/30/13 - PT STATES NO CHANGES IN HIS HEALTH HX OTHER THAN HE HAS FOLEY CATHETER.  CBC, BMET, EKG, CXR REPORTS FROM 05/29/13 AND I STAT CHEM 8 REPORT 05/30/13 ARE IN EPIC.  NO OTHER LABS NEEDED PER ANESTHESIOLOGIST GUIDELINES FOR NEW SURGERY DATE ON 06/09/13.

## 2013-06-08 NOTE — Anesthesia Preprocedure Evaluation (Addendum)
Anesthesia Evaluation  Patient identified by MRN, date of birth, ID band Patient awake    Reviewed: Allergy & Precautions, H&P , NPO status , Patient's Chart, lab work & pertinent test results  Airway Mallampati: II TM Distance: >3 FB Neck ROM: full    Dental  (+) Teeth Intact, Dental Advisory Given   Pulmonary neg pulmonary ROS,  breath sounds clear to auscultation  Pulmonary exam normal       Cardiovascular Exercise Tolerance: Good hypertension, Pt. on medications negative cardio ROS  Rhythm:regular Rate:Normal     Neuro/Psych Back surgery negative neurological ROS  negative psych ROS   GI/Hepatic negative GI ROS, Neg liver ROS, GERD-  Medicated and Controlled,  Endo/Other  negative endocrine ROS  Renal/GU negative Renal ROS  negative genitourinary   Musculoskeletal   Abdominal   Peds  Hematology negative hematology ROS (+)   Anesthesia Other Findings   Reproductive/Obstetrics negative OB ROS                          Anesthesia Physical Anesthesia Plan  ASA: II  Anesthesia Plan: General   Post-op Pain Management:    Induction: Intravenous  Airway Management Planned: LMA  Additional Equipment:   Intra-op Plan:   Post-operative Plan:   Informed Consent: I have reviewed the patients History and Physical, chart, labs and discussed the procedure including the risks, benefits and alternatives for the proposed anesthesia with the patient or authorized representative who has indicated his/her understanding and acceptance.   Dental Advisory Given  Plan Discussed with: CRNA and Surgeon  Anesthesia Plan Comments:         Anesthesia Quick Evaluation

## 2013-06-09 ENCOUNTER — Encounter (HOSPITAL_COMMUNITY): Payer: 59 | Admitting: Anesthesiology

## 2013-06-09 ENCOUNTER — Inpatient Hospital Stay (HOSPITAL_COMMUNITY)
Admission: RE | Admit: 2013-06-09 | Discharge: 2013-06-13 | DRG: 714 | Disposition: A | Discharge status: Home / Self Care | Payer: 59 | Source: Ambulatory Visit | Attending: Urology | Admitting: Urology

## 2013-06-09 ENCOUNTER — Inpatient Hospital Stay (HOSPITAL_COMMUNITY): Payer: 59 | Admitting: Anesthesiology

## 2013-06-09 ENCOUNTER — Encounter (HOSPITAL_COMMUNITY): Payer: Self-pay | Admitting: *Deleted

## 2013-06-09 ENCOUNTER — Encounter (HOSPITAL_COMMUNITY)
Admission: RE | Disposition: A | Discharge status: Home / Self Care | Payer: Self-pay | Source: Ambulatory Visit | Attending: Urology

## 2013-06-09 DIAGNOSIS — N401 Enlarged prostate with lower urinary tract symptoms: Principal | ICD-10-CM | POA: Diagnosis present

## 2013-06-09 DIAGNOSIS — Z87442 Personal history of urinary calculi: Secondary | ICD-10-CM

## 2013-06-09 DIAGNOSIS — I10 Essential (primary) hypertension: Secondary | ICD-10-CM | POA: Diagnosis present

## 2013-06-09 DIAGNOSIS — N139 Obstructive and reflux uropathy, unspecified: Secondary | ICD-10-CM | POA: Diagnosis present

## 2013-06-09 DIAGNOSIS — E291 Testicular hypofunction: Secondary | ICD-10-CM | POA: Diagnosis present

## 2013-06-09 DIAGNOSIS — K219 Gastro-esophageal reflux disease without esophagitis: Secondary | ICD-10-CM | POA: Diagnosis present

## 2013-06-09 DIAGNOSIS — Z87891 Personal history of nicotine dependence: Secondary | ICD-10-CM

## 2013-06-09 DIAGNOSIS — N400 Enlarged prostate without lower urinary tract symptoms: Secondary | ICD-10-CM | POA: Diagnosis present

## 2013-06-09 DIAGNOSIS — N138 Other obstructive and reflux uropathy: Principal | ICD-10-CM | POA: Diagnosis present

## 2013-06-09 DIAGNOSIS — R339 Retention of urine, unspecified: Secondary | ICD-10-CM | POA: Diagnosis present

## 2013-06-09 DIAGNOSIS — Z96649 Presence of unspecified artificial hip joint: Secondary | ICD-10-CM

## 2013-06-09 HISTORY — PX: TRANSURETHRAL RESECTION OF PROSTATE: SHX73

## 2013-06-09 HISTORY — DX: Presence of urogenital implants: Z96.0

## 2013-06-09 LAB — MRSA PCR SCREENING: MRSA by PCR: NEGATIVE

## 2013-06-09 SURGERY — TURP (TRANSURETHRAL RESECTION OF PROSTATE)
Anesthesia: General

## 2013-06-09 MED ORDER — LISINOPRIL 40 MG PO TABS
40.0000 mg | ORAL_TABLET | Freq: Every morning | ORAL | Status: DC
  Administered 2013-06-10 – 2013-06-13 (×4): 40 mg via ORAL
  Filled 2013-06-09 (×4): qty 1

## 2013-06-09 MED ORDER — BELLADONNA ALKALOIDS-OPIUM 16.2-60 MG RE SUPP: 1.0000 | Freq: Once | RECTAL | Status: DC

## 2013-06-09 MED ORDER — GLUCOSAMINE-CHONDROITIN 500-400 MG PO TABS: 1.0000 | ORAL_TABLET | Freq: Three times a day (TID) | ORAL | Status: DC

## 2013-06-09 MED ORDER — FENTANYL CITRATE 0.05 MG/ML IJ SOLN
INTRAMUSCULAR | Status: AC
  Filled 2013-06-09: qty 2

## 2013-06-09 MED ORDER — HYDROMORPHONE HCL PF 1 MG/ML IJ SOLN
0.5000 mg | INTRAMUSCULAR | Status: DC | PRN
  Administered 2013-06-10 – 2013-06-11 (×2): 1 mg via INTRAVENOUS
  Filled 2013-06-09 (×2): qty 1

## 2013-06-09 MED ORDER — OXYBUTYNIN CHLORIDE 5 MG PO TABS
5.0000 mg | ORAL_TABLET | Freq: Three times a day (TID) | ORAL | Status: DC | PRN
  Administered 2013-06-09 – 2013-06-12 (×6): 5 mg via ORAL
  Filled 2013-06-09 (×6): qty 1

## 2013-06-09 MED ORDER — HYDROMORPHONE HCL PF 1 MG/ML IJ SOLN
0.2500 mg | INTRAMUSCULAR | Status: DC | PRN
  Administered 2013-06-09 (×2): 0.5 mg via INTRAVENOUS

## 2013-06-09 MED ORDER — BELLADONNA ALKALOIDS-OPIUM 16.2-60 MG RE SUPP
RECTAL | Status: AC
  Filled 2013-06-09: qty 1

## 2013-06-09 MED ORDER — DIAZEPAM 5 MG PO TABS
5.0000 mg | ORAL_TABLET | Freq: Once | ORAL | Status: AC | PRN
  Administered 2013-06-09: 5 mg via ORAL
  Filled 2013-06-09: qty 1

## 2013-06-09 MED ORDER — DIPHENHYDRAMINE HCL 12.5 MG/5ML PO ELIX: 12.5000 mg | ORAL_SOLUTION | Freq: Four times a day (QID) | ORAL | Status: DC | PRN

## 2013-06-09 MED ORDER — ONDANSETRON HCL 4 MG/2ML IJ SOLN: 4.0000 mg | INTRAMUSCULAR | Status: DC | PRN

## 2013-06-09 MED ORDER — PHENOL 1.4 % MT LIQD
1.0000 | OROMUCOSAL | Status: DC | PRN
  Filled 2013-06-09: qty 177

## 2013-06-09 MED ORDER — VANCOMYCIN HCL IN DEXTROSE 1-5 GM/200ML-% IV SOLN
1000.0000 mg | Freq: Once | INTRAVENOUS | Status: AC
  Administered 2013-06-09: 1000 mg via INTRAVENOUS

## 2013-06-09 MED ORDER — MENTHOL 3 MG MT LOZG
1.0000 | LOZENGE | OROMUCOSAL | Status: DC | PRN
  Administered 2013-06-09: 3 mg via ORAL
  Filled 2013-06-09: qty 9

## 2013-06-09 MED ORDER — FENTANYL CITRATE 0.05 MG/ML IJ SOLN
INTRAMUSCULAR | Status: DC | PRN
  Administered 2013-06-09 (×3): 50 ug via INTRAVENOUS

## 2013-06-09 MED ORDER — BELLADONNA ALKALOIDS-OPIUM 16.2-60 MG RE SUPP
RECTAL | Status: DC | PRN
  Administered 2013-06-09: 1 via RECTAL

## 2013-06-09 MED ORDER — DEXAMETHASONE SODIUM PHOSPHATE 10 MG/ML IJ SOLN
INTRAMUSCULAR | Status: DC | PRN
  Administered 2013-06-09: 10 mg via INTRAVENOUS

## 2013-06-09 MED ORDER — MIDAZOLAM HCL 5 MG/5ML IJ SOLN
INTRAMUSCULAR | Status: DC | PRN
  Administered 2013-06-09: 2 mg via INTRAVENOUS

## 2013-06-09 MED ORDER — SODIUM CHLORIDE 0.9 % IR SOLN
3000.0000 mL | Status: DC
  Administered 2013-06-09 – 2013-06-11 (×9): 3000 mL

## 2013-06-09 MED ORDER — HYDROMORPHONE HCL PF 1 MG/ML IJ SOLN
0.2500 mg | INTRAMUSCULAR | Status: DC | PRN
  Administered 2013-06-09 (×4): 0.5 mg via INTRAVENOUS

## 2013-06-09 MED ORDER — DOXYCYCLINE HYCLATE 100 MG PO TABS
100.0000 mg | ORAL_TABLET | Freq: Two times a day (BID) | ORAL | Status: DC
  Administered 2013-06-09 – 2013-06-13 (×9): 100 mg via ORAL
  Filled 2013-06-09 (×10): qty 1

## 2013-06-09 MED ORDER — FAMOTIDINE 10 MG PO TABS
10.0000 mg | ORAL_TABLET | Freq: Every day | ORAL | Status: DC
  Administered 2013-06-09 – 2013-06-13 (×5): 10 mg via ORAL
  Filled 2013-06-09 (×5): qty 1

## 2013-06-09 MED ORDER — OXYCODONE-ACETAMINOPHEN 5-325 MG PO TABS: 1.0000 | ORAL_TABLET | Freq: Four times a day (QID) | ORAL | Status: DC | PRN

## 2013-06-09 MED ORDER — LACTATED RINGERS IV SOLN
INTRAVENOUS | Status: DC | PRN
  Administered 2013-06-09 (×2): via INTRAVENOUS

## 2013-06-09 MED ORDER — HYDROMORPHONE HCL PF 1 MG/ML IJ SOLN
INTRAMUSCULAR | Status: AC
  Filled 2013-06-09: qty 1

## 2013-06-09 MED ORDER — NAPROXEN SODIUM 550 MG PO TABS
550.0000 mg | ORAL_TABLET | Freq: Two times a day (BID) | ORAL | Status: DC
  Administered 2013-06-10 (×2): 550 mg via ORAL
  Filled 2013-06-09 (×6): qty 1

## 2013-06-09 MED ORDER — SODIUM CHLORIDE 0.9 % IR SOLN
Status: DC | PRN
  Administered 2013-06-09: 15000 mL

## 2013-06-09 MED ORDER — BISACODYL 10 MG RE SUPP: 10.0000 mg | Freq: Every day | RECTAL | Status: DC | PRN

## 2013-06-09 MED ORDER — LACTATED RINGERS IV SOLN: INTRAVENOUS | Status: DC

## 2013-06-09 MED ORDER — MIDAZOLAM HCL 2 MG/2ML IJ SOLN
INTRAMUSCULAR | Status: AC
  Filled 2013-06-09: qty 2

## 2013-06-09 MED ORDER — DOXYCYCLINE HYCLATE 50 MG PO CAPS: 100.0000 mg | ORAL_CAPSULE | Freq: Two times a day (BID) | ORAL | Status: DC

## 2013-06-09 MED ORDER — OMEGA-3-ACID ETHYL ESTERS 1 G PO CAPS
1.0000 g | ORAL_CAPSULE | Freq: Every day | ORAL | Status: DC
  Administered 2013-06-09 – 2013-06-10 (×2): 1 g via ORAL
  Filled 2013-06-09 (×3): qty 1

## 2013-06-09 MED ORDER — CEFAZOLIN SODIUM-DEXTROSE 2-3 GM-% IV SOLR
2.0000 g | INTRAVENOUS | Status: AC
  Administered 2013-06-09: 2 g via INTRAVENOUS

## 2013-06-09 MED ORDER — ONDANSETRON HCL 4 MG/2ML IJ SOLN
INTRAMUSCULAR | Status: AC
  Filled 2013-06-09: qty 2

## 2013-06-09 MED ORDER — URELLE 81 MG PO TABS
1.0000 | ORAL_TABLET | Freq: Four times a day (QID) | ORAL | Status: DC | PRN
  Filled 2013-06-09: qty 1

## 2013-06-09 MED ORDER — EPHEDRINE SULFATE 50 MG/ML IJ SOLN
INTRAMUSCULAR | Status: DC | PRN
  Administered 2013-06-09: 10 mg via INTRAVENOUS
  Administered 2013-06-09: 5 mg via INTRAVENOUS
  Administered 2013-06-09: 10 mg via INTRAVENOUS

## 2013-06-09 MED ORDER — NAPROXEN SODIUM 275 MG PO TABS: 440.0000 mg | ORAL_TABLET | Freq: Two times a day (BID) | ORAL | Status: DC

## 2013-06-09 MED ORDER — DEXAMETHASONE SODIUM PHOSPHATE 10 MG/ML IJ SOLN
INTRAMUSCULAR | Status: AC
  Filled 2013-06-09: qty 1

## 2013-06-09 MED ORDER — PROPOFOL 10 MG/ML IV BOLUS
INTRAVENOUS | Status: AC
  Filled 2013-06-09: qty 20

## 2013-06-09 MED ORDER — BACITRACIN-NEOMYCIN-POLYMYXIN 400-5-5000 EX OINT: 1.0000 "application " | TOPICAL_OINTMENT | Freq: Three times a day (TID) | CUTANEOUS | Status: DC | PRN

## 2013-06-09 MED ORDER — SENNOSIDES-DOCUSATE SODIUM 8.6-50 MG PO TABS
2.0000 | ORAL_TABLET | Freq: Every day | ORAL | Status: DC
Start: 2013-06-09 — End: 2013-06-13
  Administered 2013-06-11 – 2013-06-12 (×2): 2 via ORAL
  Filled 2013-06-09 (×5): qty 2

## 2013-06-09 MED ORDER — LIDOCAINE HCL (CARDIAC) 20 MG/ML IV SOLN
INTRAVENOUS | Status: DC | PRN
  Administered 2013-06-09: 100 mg via INTRAVENOUS

## 2013-06-09 MED ORDER — URIBEL 118 MG PO CAPS: 1.0000 | ORAL_CAPSULE | Freq: Four times a day (QID) | ORAL | Status: DC | PRN

## 2013-06-09 MED ORDER — HYDROMORPHONE HCL PF 1 MG/ML IJ SOLN
0.5000 mg | INTRAMUSCULAR | Status: DC | PRN
  Administered 2013-06-09: 0.5 mg via INTRAVENOUS

## 2013-06-09 MED ORDER — VANCOMYCIN HCL IN DEXTROSE 1-5 GM/200ML-% IV SOLN
INTRAVENOUS | Status: AC
  Filled 2013-06-09: qty 200

## 2013-06-09 MED ORDER — PROPOFOL 10 MG/ML IV BOLUS
INTRAVENOUS | Status: DC | PRN
  Administered 2013-06-09: 200 mg via INTRAVENOUS

## 2013-06-09 MED ORDER — LIDOCAINE HCL (CARDIAC) 20 MG/ML IV SOLN
INTRAVENOUS | Status: AC
  Filled 2013-06-09: qty 5

## 2013-06-09 MED ORDER — CEFAZOLIN SODIUM-DEXTROSE 2-3 GM-% IV SOLR
INTRAVENOUS | Status: AC
  Filled 2013-06-09: qty 50

## 2013-06-09 MED ORDER — PANTOPRAZOLE SODIUM 40 MG PO TBEC
40.0000 mg | DELAYED_RELEASE_TABLET | Freq: Every day | ORAL | Status: DC
  Administered 2013-06-10 – 2013-06-13 (×4): 40 mg via ORAL
  Filled 2013-06-09 (×5): qty 1

## 2013-06-09 MED ORDER — DIPHENHYDRAMINE HCL 50 MG/ML IJ SOLN: 12.5000 mg | Freq: Four times a day (QID) | INTRAMUSCULAR | Status: DC | PRN

## 2013-06-09 MED ORDER — OXYCODONE-ACETAMINOPHEN 5-325 MG PO TABS
1.0000 | ORAL_TABLET | ORAL | Status: DC | PRN
  Administered 2013-06-12: 1 via ORAL
  Filled 2013-06-09: qty 1

## 2013-06-09 MED ORDER — ONDANSETRON HCL 4 MG/2ML IJ SOLN
INTRAMUSCULAR | Status: DC | PRN
  Administered 2013-06-09: 4 mg via INTRAVENOUS

## 2013-06-09 MED ORDER — SODIUM CHLORIDE 0.45 % IV SOLN
INTRAVENOUS | Status: DC
  Administered 2013-06-09 – 2013-06-11 (×3): via INTRAVENOUS

## 2013-06-09 SURGICAL SUPPLY — 31 items
BAG URINE DRAINAGE (UROLOGICAL SUPPLIES) ×2 IMPLANT
BAG URO CATCHER STRL LF (DRAPE) ×3 IMPLANT
BLADE SURG 15 STRL LF DISP TIS (BLADE) IMPLANT
BLADE SURG 15 STRL SS (BLADE)
CATH AINSWORTH 30CC 24FR (CATHETERS) IMPLANT
CATH FOLEY 3WAY 30CC 24FR (CATHETERS)
CATH HEMA 3WAY 30CC 22FR COUDE (CATHETERS) ×2 IMPLANT
CATH URO 16X24FR 3W FL PS (CATHETERS) IMPLANT
DRAPE CAMERA CLOSED 9X96 (DRAPES) ×3 IMPLANT
ELECT BUTTON HF 24-28F 2 30DE (ELECTRODE) ×1 IMPLANT
ELECT LOOP MED HF 24F 12D (CUTTING LOOP) ×1 IMPLANT
ELECT LOOP MED HF 24F 12D CBL (CLIP) ×3 IMPLANT
ELECT RESECT VAPORIZE 12D CBL (ELECTRODE) IMPLANT
GLOVE BIOGEL M STRL SZ7.5 (GLOVE) ×9 IMPLANT
GOWN STRL REUS W/TWL LRG LVL3 (GOWN DISPOSABLE) ×3 IMPLANT
GOWN STRL REUS W/TWL XL LVL3 (GOWN DISPOSABLE) ×3 IMPLANT
HOLDER FOLEY CATH W/STRAP (MISCELLANEOUS) ×2 IMPLANT
KIT ASPIRATION TUBING (SET/KITS/TRAYS/PACK) ×3 IMPLANT
KIT SUPRAPUBIC CATH (MISCELLANEOUS) IMPLANT
MANIFOLD NEPTUNE II (INSTRUMENTS) ×3 IMPLANT
MARKER SKIN DUAL TIP RULER LAB (MISCELLANEOUS) ×1 IMPLANT
NDL SPNL 18GX3.5 QUINCKE PK (NEEDLE) IMPLANT
NEEDLE SPNL 18GX3.5 QUINCKE PK (NEEDLE) IMPLANT
NS IRRIG 1000ML POUR BTL (IV SOLUTION) ×3 IMPLANT
PACK CYSTO (CUSTOM PROCEDURE TRAY) ×3 IMPLANT
SCRUB PCMX 4 OZ (MISCELLANEOUS) ×1 IMPLANT
SUT ETHILON 3 0 PS 1 (SUTURE) IMPLANT
SYR 30ML LL (SYRINGE) ×4 IMPLANT
SYRINGE IRR TOOMEY STRL 70CC (SYRINGE) ×3 IMPLANT
TUBING CONNECTING 10 (TUBING) ×2 IMPLANT
TUBING CONNECTING 10' (TUBING) ×1

## 2013-06-09 NOTE — Anesthesia Postprocedure Evaluation (Signed)
  Anesthesia Post-op Note  Patient: Kevin [name]  Procedure(s) Performed: Procedure(s) (LRB): TRANSURETHRAL RESECTION OF THE PROSTATE (TURP) WITH GYRUS (N/A)  Patient Location: PACU  Anesthesia Type: General  Level of Consciousness: awake and alert   Airway and Oxygen Therapy: Patient Spontanous Breathing  Post-op Pain: mild  Post-op Assessment: Post-op Vital signs reviewed, Patient's Cardiovascular Status Stable, Respiratory Function Stable, Patent Airway and No signs of Nausea or vomiting  Last Vitals:  Filed Vitals:   06/09/13 0847  BP: 142/57  Pulse: 94  Temp: 36.6 C  Resp: 15    Post-op Vital Signs: stable   Complications: No apparent anesthesia complications

## 2013-06-09 NOTE — Interval H&P Note (Signed)
History and Physical Interval Note:  06/09/2013 7:22 AM  Kevin [name]  has presented today for surgery, with the diagnosis of BPH AND ACUTE URINARY RETENTION  The various methods of treatment have been discussed with the patient and family. After consideration of risks, benefits and other options for treatment, the patient has consented to  Procedure(s): TRANSURETHRAL RESECTION OF THE PROSTATE (TURP) WITH GYRUS (N/A) as a surgical intervention .  The patient's history has been reviewed, patient examined, no change in status, stable for surgery.  I have reviewed the patient's chart and labs.  Questions were answered to the patient's satisfaction.    Pt has noted 2-3 days of 2 erythematous areas , 1-1.5cm, encrusted but non-draining, over Right elbow and L shoulder. Non-painful. No hx of infection. ? MRSA. He has had nasal swab this AM ( result pending). Will cover with doxycycline post TURP.   Kathi Ludwig

## 2013-06-09 NOTE — Op Note (Signed)
Pre-operative diagnosis :  BPH  Postoperative diagnosis:  Same  Operation:  Cystoscopy, TURP  Surgeon:  S. Patsi Sears, MD  First assistant:  Mpme  Anesthesia:  General LMA  Preparation: After appropriate pre-medication, the patient was brought to the operating room and placed upon the OR table in the supime position, where General LMA anesthesia was introduced. He was then replaced in the dorsal lithotomy position, and the indwelling foley was removed.  The pubis was prepped with betadine solution and draped in the usual fashion.    The upper arm and torso erythematous encrusted areas were isolated and bandaged. Infectious disease was called, who advised MRSA coverage with Vancomycin,  And 1 gram of Vancomycin was ordered after consultation with pharmacy.    Review history:  Active Problems  Problems  1. 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 He is s/p cysto/clot evacuation, Rt lateral prostate biopsy & cauterization of both Rt/Lt lateral lobes of the prostate on 05/30/13. He has failed multiple voiding trials.  Hx of gross hematuria, s/p cysto on 05/23/13 that showed a large clot within the bladder. He was last seen by Seward Grater, NP on 02/13/13 for 3- day hx of painless intermittent dark blood w/ clots, but now intermittent. First couple of time, urine had cranberry red color but now urine is pink color at the beginning then clears up at the end of the stream, no clots. He had been taking fish oil and herbal and had stopped taking both of these when he first saw gross hematuria. Denies fever/chills/nausea/vomiting. Denies any associate voiding discomfort. He had experienced an episode of gross hematuria post TURP in 2012 but resolved w/o interventions. He used to smoke cigarette  on occasions and had quitted smoking 35 yrs ago. Denies hx of exposure to chemical. He does have hx of stone and had undergone right ESWL in 2008.  He just had shoulder surgery for dislocation on 02/09/13.      Statement of  Likelihood of Success: Excellent. TIME-OUT observed.:  Procedure:  Attention was then directed to the prostate. The size 28 F continuous flow resectoscope sheath was placed in the urethra, and inspection showed normal urethra, with greatly enlarged lateral lobes. Eveidence of prior TUIP was noted.    Resection was accomplished from the 11:00 position to the 6: o'clock position, and then from the 1:o'clock to the 6 o:clock position. Chips were then sent to pathology per Nursing protocol after tissue time-out. Hematuria was then electrocoagulated.    A 60F 3-way continuous flow hematuria catheter was inserted, and placed to CBI and traction.    The patient was awakened ans taken to the PAR in good condition.

## 2013-06-09 NOTE — Transfer of Care (Signed)
Immediate Anesthesia Transfer of Care Note  Patient: Kevin [name]  Procedure(s) Performed: Procedure(s): TRANSURETHRAL RESECTION OF THE PROSTATE (TURP) WITH GYRUS (N/A)  Patient Location: PACU  Anesthesia Type:General  Level of Consciousness: sedated  Airway & Oxygen Therapy: Patient Spontanous Breathing and Patient connected to face mask oxygen  Post-op Assessment: Report given to PACU RN and Post -op Vital signs reviewed and stable  Post vital signs: Reviewed and stable  Complications: No apparent anesthesia complications

## 2013-06-09 NOTE — Progress Notes (Signed)
PHARMACIST - PHYSICIAN ORDER COMMUNICATION  TOPIC: P&T Medication Policy on Herbal Medications  DISCUSSION:  This patient's order for Glucosamine-Chondroitin has been noted.  This product is classified as an "herbal" or natural product.  The Pharmacy and Therapeutics Committee does not permit the use of "herbal" or natural products of this type within [facility].  This policy was adopted due to a lack of definitive safety studies, lack of FDA approval, nonstandard manufacturing practices, and the potential risk of unknown drug-drug interactions with inpatient medications   ACTION TAKEN: The order for Glucosamine-Chondroitin has been discontinued.  At discharge, please determine whether or not  the order should be restarted.  Polo Riley R.Ph. 04/08/2012 11:02 AM

## 2013-06-09 NOTE — H&P (Signed)
Reason For Visit PO urinary retention   Active Problems Problems  1. 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 He is s/p cysto/clot evacuation, Rt lateral prostate biopsy & cauterization of both Rt/Lt lateral lobes of the prostate on 05/30/13. He has failed multiple voiding trials.     Hx of gross hematuria, s/p cysto on 05/23/13 that showed a large clot within the bladder. He was last seen by Seward GraterNadine Nguyen, NP on 02/13/13 for 3- day hx of painless intermittent dark blood w/ clots, but now intermittent. First couple of time, urine had cranberry red color but now urine is pink color at the beginning then clears up at the end of the stream, no clots. He had been taking fish oil and herbal and had stopped taking both of these when he first saw gross hematuria. Denies fever/chills/nausea/vomiting. Denies any associate voiding discomfort. He had experienced an episode of gross hematuria post TURP in 2012 but resolved w/o interventions. He used to smoke cigarette on occasions and had quitted smoking 35 yrs ago. Denies hx of exposure to chemical. He does have hx of stone and had undergone right ESWL in 2008.    He just had shoulder surgery for dislocation on 02/09/13.    1) Hx of BPH with LUTS: severe bladder outlet symptoms, failing Rapaflo therapy,  Hx of urgency, frequency, with an international prostate symptom score of 22 on Uroxatral. He also has a history of Avodart complication because of decreased ejaculate volume of 2009. He had a 70cc gland and underwent a bladder neck incision and TUIR of lateral lobe of prostate in 2012 w/ benign path. He has been voiding well ever since that time.     2) ED, hypogonadism, and dysplastic prostate. He restarted his Androgel following  prostate biopsy on 12/22/11 for hx of an elevated PSA of 7.187 on 11/23/11 per Dr. Wylene Simmerisovec. Path showed 1 area of HgPIN, and his elevated psa was thought 2ndary BPH and Testosterone effect. Elevated psa was treated with Cipro to see if this is possibly related to prostatitis. Testopel #16 insertion on 06/05/11 but testosterone was still low 274 during Nov 2014 visit. Therefore, androgel double dose was started and testosterone level improved to 747 during most recent visit in Dec 2014.  11/01/12 PSA 4.73/17%         HCT 44.9%         Hgb 16   Past Medical History Problems  1. History of Arthritis (V13.4) 2. 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 History of hypertension (V12.59)  Surgical History Problems  1. History of Back Surgery 2. History of Inguinal Hernia Repair 3. History of Lithotripsy 4. History of Shoulder Surgery 5. History of Shoulder Surgery 6. History of Total Hip Replacement 7. History of Transurethral Resection Of Prostate (TURP)  Current Meds 1. Aleve TABS;  Therapy: (Recorded:26Mar2008) to Recorded 2. AndroGel Pump 20.25 MG/ACT (1.62%) Transdermal Gel; Apply 4 pumps daily to upper  arms/chest area;  Therapy: 21Oct2014 to (Last Rx:03Dec2014) Ordered 3. Cephalexin 500 MG Oral Capsule;  Therapy: (Recorded:13May2015) to Recorded 4. Cialis 20 MG Oral Tablet; TAKE 1 TABLET As Directed;  Therapy: 03Nov2008 to (Last Rx:23Apr2014)  Requested for: 23Apr2014 Ordered 5. Finasteride 5 MG Oral Tablet; TAKE ONE TABLET BY MOUTH DAILY;  Therapy: 13May2015 to (Last Rx:13May2015)  Requested for: 13May2015 Ordered 6. Glucosamine CAPS;  Therapy: (Recorded:26Mar2008) to Recorded 7. GNP Fish Oil CAPS;  Therapy: (Recorded:26Mar2008) to Recorded 8. Lisinopril 10 MG Oral Tablet;  Therapy: 08Nov2011 to Recorded 9. Omeprazole 40 MG Oral Capsule Delayed Release;  Therapy: 09Feb2012 to Recorded 10. Oxycodone-Acetaminophen TABS;   Therapy: (Recorded:26Jan2015) to Recorded 11. Rapaflo  8 MG Oral Capsule;   Therapy: (Recorded:13May2015) to Recorded 12. TraMADol HCl - 50 MG Oral Tablet; Uses rarely, only prn;   Therapy: 30Mar2012 to Recorded 13. Uribel 118 MG Oral Capsule;   Therapy: (Recorded:13May2015) to Recorded 14. V-R Vitamin E CAPS;   Therapy: (Recorded:26Mar2008) to Recorded  Allergies Medication  1. Ciprofloxacin HCl TABS  Family History Problems  1. No pertinent family history  Social History Problems  1. Alcohol Use 2. Caffeine Use   3 PER DAY 3. Marital History - Currently Married 4. Never A Smoker 5. Occupation:   Civil engineer, contracting  Review of Systems Genitourinary, constitutional, skin, eye, otolaryngeal, hematologic/lymphatic, cardiovascular, pulmonary, endocrine, musculoskeletal, gastrointestinal, neurological and psychiatric system(s) were reviewed and pertinent findings if present are noted.  Genitourinary: inability to urinate.    Vitals Vital Signs [Data Includes: Last 1 Day]  Recorded: 19May2015 11:34AM  Blood Pressure: 147 / 82 Temperature: 98.5 F Heart Rate: 74  Physical Exam Constitutional: Well nourished and well developed . No acute distress.  ENT:. The ears and nose are normal in appearance.  Neck: The appearance of the neck is normal and no neck mass is present.  Pulmonary: No respiratory distress and normal respiratory rhythm and effort.  Cardiovascular: Heart rate and rhythm are normal . No peripheral edema.  Abdomen: The abdomen is soft and nontender. No masses are palpated. No CVA tenderness. No hernias are palpable. No hepatosplenomegaly noted.  Rectal: Rectal exam demonstrates normal sphincter tone, no tenderness and no masses. Estimated prostate size is 3+. Normal rectal tone, no rectal masses, prostate is smooth, symmetric and non-tender. The prostate has no nodularity and is not tender. The left seminal vesicle is nonpalpable. The right seminal vesicle is nonpalpable. The perineum is normal on inspection.   Genitourinary: Examination of the penis demonstrates no discharge, no masses, no lesions and a normal meatus. The scrotum is without lesions. The right epididymis is palpably normal and non-tender. The left epididymis is palpably normal and non-tender. The right testis is non-tender and without masses. The left testis is non-tender and without masses.  Lymphatics: The femoral and inguinal nodes are not enlarged or tender.  Skin: Normal skin turgor, no visible rash and no visible skin lesions.  Neuro/Psych:. Mood and affect are appropriate.    Assessment Assessed  1. 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 He has had cystoscopy and bx of the prostate and clot evacuation,  Now having spasms, and note that he is counselled to have TURP asap. He is allergic to cipro. Will continue keflex until surgery. He will begin Uribel 4x/day.   Plan  1.   TURP at [facility] asap.   2. Keep on proscar and Rapaflo through surgery.   Cephalexin 500 MG Oral Capsule; TAKE 1 CAPSULE EVERY 6 HOURS DAILY Requested for: 19May2015; Last Rx:19May2015; Status: ACTIVE - Retrospective Authorization Ordered  Rx By: Jethro Bolus; Dispense: 5 Days ; #:20 Capsule; Refill: 0;   For: Benign prostatic hyperplasia with urinary obstruction, Urinary tract infection; DAW = N; Verified Transmission to [facility]; Last Updated By: System,SureScripts; 06/06/2013 3:47:21 PM  Signatures Electronically signed by : Jethro Bolus, M.D.; Jun 06 2013  5:45PM EST

## 2013-06-09 NOTE — Care Management Note (Signed)
UR complete    Anvita Hirata,MSN,RN [phone] 

## 2013-06-09 NOTE — Progress Notes (Signed)
Urology Progress Note  Day of Surgery : post-op TURP Urine: bloody, but without clot. Tolerating catheter adequately.    Subjective:     No acute urologic events overnight. Ambulation:   negative Flatus:    positive Bowel movement  negative  Pain: some relief  Objective:  Blood pressure 160/78, pulse 77, temperature 97.8 F (36.6 C), temperature source Oral, resp. rate 14, SpO2 95.00%.  Physical Exam:  General:  No acute distress, awake Resp: clear to auscultation bilaterally Genitourinary:   Foley to straight drain. CBUI. Traction Foley: yes       No results found for this basename: HGB, WBC, PLT,  in the last 72 hours  No results found for this basename: NA, K, CL, CO2, BUN, CREATININE, CALCIUM, MAGNESIUM, GFRNONAA, GFRAA,  in the last 72 hours   No results found for this basename: PT, INR, APTT,  in the last 72 hours   No components found with this basename: ABG,   Assessment/Plan: Discussed surgery with patient and wife. Pt will walk today.  Catheter not removed. Continue any current medications. Weekend plan:  D/c CBI and traction Saturday. Hand irrigate prn.                           C/c foley Sunday with string of bottles and probable d/c Sunday after lunch.

## 2013-06-09 NOTE — Discharge Instructions (Addendum)
Post transurethral resection of the prostate (TURP) instructions  Your recent prostate surgery requires very special post hospital care. Despite the fact that no skin incisions were used the area around the prostate incision is quite raw and is covered with a scab to promote healing and prevent bleeding. Certain cautions are needed to assure that the scab is not disturbed of the next 2-3 weeks while the healing proceeds.  Because the raw surface in your prostate and the irritating effects of urine you may expect frequency of urination and/or urgency (a stronger desire to urinate) and perhaps even getting up at night more often. This will usually resolve or improve slowly over the healing period. You may see some blood in your urine over the first 6 weeks. Do not be alarmed, even if the urine was clear for a while. Get off your feet and drink lots of fluids until clearing occurs. If you start to pass clots or don't improve call us.  Diet:  You may return to your normal diet immediately. Because of the raw surface of your bladder, alcohol, spicy foods, foods high in acid and drinks with caffeine may cause irritation or frequency and should be used in moderation. To keep your urine flowing freely and avoid constipation, drink plenty of fluids during the day (8-10 glasses). Tip: Avoid cranberry juice because it is very acidic.  Activity:  Your physical activity doesn't need to be restricted. However, if you are very active, you may see some blood in the urine. We suggest that you reduce your activity under the circumstances until the bleeding has stopped.  Bowels:  It is important to keep your bowels regular during the postoperative period. Straining with bowel movements can cause bleeding. A bowel movement every other day is reasonable. Use a mild laxative if needed, such as milk of magnesia 2-3 tablespoons, or 2 Dulcolax tablets. Call if you continue to have problems. If you had been taking narcotics  for pain, before, during or after your surgery, you may be constipated. Take a laxative if necessary.  Medication:  You should resume your pre-surgery medications unless told not to. In addition you may be given an antibiotic to prevent or treat infection. Antibiotics are not always necessary. All medication should be taken as prescribed until the bottles are finished unless you are having an unusual reaction to one of the drugs.     Problems you should report to [place]:  a. Fever greater than 101F. b. Heavy bleeding, or clots (see notes above about blood in urine). c. Inability to urinate. d. Drug reactions (hives, rash, nausea, vomiting, diarrhea). e. Severe burning or pain with urination that is not improving. No Golf or lawn mowing or lifting > 20 lbs.  Push fluids.

## 2013-06-10 MED ORDER — DIAZEPAM 5 MG PO TABS: 5.0000 mg | ORAL_TABLET | Freq: Once | ORAL | Status: AC | PRN

## 2013-06-10 NOTE — Progress Notes (Signed)
Vitals OK Spoke with nursing Few small clots thru night but no irrigation by hand Dark/red worse when slow flow **Sig wants to go slow Continue with CBI and advance diet

## 2013-06-11 MED ORDER — DIAZEPAM 5 MG PO TABS
5.0000 mg | ORAL_TABLET | Freq: Once | ORAL | Status: AC
  Administered 2013-06-11: 5 mg via ORAL
  Filled 2013-06-11: qty 1

## 2013-06-11 MED ORDER — HEPARIN SOD (PORK) LOCK FLUSH 100 UNIT/ML IV SOLN
500.0000 [IU] | Freq: Once | INTRAVENOUS | Status: DC
  Filled 2013-06-11: qty 5

## 2013-06-11 NOTE — Progress Notes (Signed)
2 Days Post-Op Subjective: Patient reports : No pain.  Objective: Vital signs in last 24 hours: Temp:  [97.5 F (36.4 C)-98 F (36.7 C)] 97.8 F (36.6 C) (05/24 0523) Pulse Rate:  [54-78] 54 (05/24 0523) Resp:  [16-18] 16 (05/24 0523) BP: (119-136)/(51-58) 136/51 mmHg (05/24 0523) SpO2:  [98 %-100 %] 100 % (05/24 0523)  Intake/Output from previous day: 05/23 0701 - 05/24 0700 In: 13432.5 [P.O.:840; I.V.:592.5] Out: 58099 [Urine:18875] Intake/Output this shift: Total I/O In: 6000 [Other:6000] Out: 3550 [Urine:3550]  Physical Exam:  General: Abdomen: Soft. CBI running.  Urine still bloody Lab Results: No results found for this basename: HGB, HCT,  in the last 72 hours BMET No results found for this basename: NA, K, CL, CO2, GLUCOSE, BUN, CREATININE, CALCIUM,  in the last 72 hours No results found for this basename: LABPT, INR,  in the last 72 hours No results found for this basename: LABURIN,  in the last 72 hours Results for orders placed during the hospital encounter of 06/09/13  MRSA PCR SCREENING     Status: None   Collection Time    06/09/13  7:10 AM      Result Value Ref Range Status   MRSA by PCR NEGATIVE  NEGATIVE Final   Comment:            The GeneXpert MRSA Assay (FDA     approved for NASAL specimens     only), is one component of a     comprehensive MRSA colonization     surveillance program. It is not     intended to diagnose MRSA     infection nor to guide or     monitor treatment for     MRSA infections.    Studies/Results: No results found.  Assessment/Plan:  S/P TURP  Continue CBI.  Discontinue when urine is clear   LOS: 2 days   Danae Chen 06/11/2013, 9:22 AM

## 2013-06-12 NOTE — Progress Notes (Signed)
3 Days Post-Op Subjective: Patient reports No pain.  Tolerates diet well.  Had BM this morning.  Objective: Vital signs in last 24 hours: Temp:  [97.5 F (36.4 C)-98.1 F (36.7 C)] 97.6 F (36.4 C) (05/25 0540) Pulse Rate:  [58-67] 58 (05/25 0540) Resp:  [16-18] 16 (05/25 0540) BP: (121-147)/(44-63) 147/62 mmHg (05/25 0540) SpO2:  [96 %-99 %] 97 % (05/25 0540)  Intake/Output from previous day: 05/24 0701 - 05/25 0700 In: 7440 [P.O.:1440] Out: 9045 [Urine:9045] Intake/Output this shift:    Physical Exam:  General:Abdomen: Soft, non distended, non tender. Foley draining well.  Urine rose with some small blood clots.  Lab Results: No results found for this basename: HGB, HCT,  in the last 72 hours BMET No results found for this basename: NA, K, CL, CO2, GLUCOSE, BUN, CREATININE, CALCIUM,  in the last 72 hours No results found for this basename: LABPT, INR,  in the last 72 hours No results found for this basename: LABURIN,  in the last 72 hours Results for orders placed during the hospital encounter of 06/09/13  MRSA PCR SCREENING     Status: None   Collection Time    06/09/13  7:10 AM      Result Value Ref Range Status   MRSA by PCR NEGATIVE  NEGATIVE Final   Comment:            The GeneXpert MRSA Assay (FDA     approved for NASAL specimens     only), is one component of a     comprehensive MRSA colonization     surveillance program. It is not     intended to diagnose MRSA     infection nor to guide or     monitor treatment for     MRSA infections.    Studies/Results: No results found.  Assessment/Plan: S/P TURP Remove Foley. Home today if voids well.   LOS: 3 days   Danae Chen 06/12/2013, 10:09 AM

## 2013-06-12 NOTE — Progress Notes (Signed)
Patient ID: Kevin [name], male   DOB: [birth year], 68 y.o.   MRN: [id number] Voiding on his own.  Urine pinkish.  PVR: !90 ml. Patient is too anxious to go home.  He is worried that he won't be able to urinate tonight and will have to come back to the ER.  Will keep him overnight and discharge him in AM.

## 2013-06-13 ENCOUNTER — Encounter (HOSPITAL_COMMUNITY): Payer: Self-pay | Admitting: Urology

## 2013-06-13 MED ORDER — FINASTERIDE 5 MG PO TABS: 5.0000 mg | ORAL_TABLET | Freq: Every day | ORAL | Status: DC

## 2013-06-13 MED ORDER — DOXYCYCLINE HYCLATE 50 MG PO CAPS: 100.0000 mg | ORAL_CAPSULE | Freq: Two times a day (BID) | ORAL | Status: DC

## 2013-06-13 MED ORDER — OXYCODONE-ACETAMINOPHEN 5-325 MG PO TABS: 1.0000 | ORAL_TABLET | Freq: Four times a day (QID) | ORAL | Status: DC | PRN

## 2013-06-13 NOTE — Progress Notes (Signed)
Urology Progress Note  4 Days Post-Op  Foley out, + voided 1700cc with 190cc pvr. Feels well. Passed single clot yesterday.  Subjective:     No acute urologic events overnight. Ambulation:   positive Flatus:    positive Bowel movement  positive  Pain: complete resolution  Objective:  Blood pressure 143/70, pulse 60, temperature 98.5 F (36.9 C), temperature source Oral, resp. rate 18, height 5\' 9"  (1.753 m), weight 93.5 kg (206 lb 2.1 oz), SpO2 99.00%.  Physical Exam:  General:  No acute distress, awake Extremities: extremities normal, atraumatic, no cyanosis or edema Genitourinary:  Foley out. + voiding. Passed single clot. Foley: out    I/O last 3 completed shifts: In: 720 [P.O.:720] Out: 5396 [Urine:5396]  No results found for this basename: HGB, WBC, PLT,  in the last 72 hours  No results found for this basename: NA, K, CL, CO2, BUN, CREATININE, CALCIUM, MAGNESIUM, GFRNONAA, GFRAA,  in the last 72 hours   No results found for this basename: PT, INR, APTT,  in the last 72 hours   No components found with this basename: ABG,   Assessment/Plan:  Wound care discussed. No golf. Ok for walking. No lown mowing.

## 2013-06-13 NOTE — Progress Notes (Signed)
Discharged to home, wife at bedside,d/c instructions and follow up appt done and discussed with patient and wife, verbalized understanding. PIV removed no s/s of infiltration or swelling noted.

## 2013-06-13 NOTE — Discharge Summary (Signed)
  Physician Discharge Summary  Patient ID: Kevin [name] MRN: [id number] DOB/AGE: 1945/08/24 68 y.o.  Admit date: 06/09/2013 Discharge date: 06/13/2013  Admission Diagnoses: BPH AND ACUTE URINARY RETENTION  Discharge Diagnoses:  Active Problems:   Benign prostatic hypertrophy   Discharged Condition: improved  Hospital Course:   TURP  Consults: none  Significant Diagnostic Studies: No results found.  Treatments: IV hydration, antibiotics: doxycycline,  and turp  Discharge Exam: Blood pressure 143/70, pulse 60, temperature 98.5 F (36.9 C), temperature source Oral, resp. rate 18, height 5\' 9"  (1.753 m), weight 93.5 kg (206 lb 2.1 oz), SpO2 99.00%. Arms: red marks on arms resolved ( suspect community aquired mrsa),  Nasal swab NEG.  Disposition: 01-Home or Self Care     Medication List    TAKE these medications       doxycycline 50 MG capsule  Commonly known as:  VIBRAMYCIN  Take 2 capsules (100 mg total) by mouth 2 (two) times daily.      ASK your doctor about these medications       cephALEXin 500 MG capsule  Commonly known as:  KEFLEX  Take 500 mg by mouth 2 (two) times daily.     esomeprazole 40 MG capsule  Commonly known as:  NEXIUM  Take 40 mg by mouth every morning.     glucosamine-chondroitin 500-400 MG tablet  Take 1 tablet by mouth 3 (three) times daily.     lisinopril 40 MG tablet  Commonly known as:  PRINIVIL,ZESTRIL  Take 40 mg by mouth every morning.     naproxen sodium 220 MG tablet  Commonly known as:  ANAPROX  Take 440 mg by mouth 2 (two) times daily with a meal.     omega-3 acid ethyl esters 1 G capsule  Commonly known as:  LOVAZA  Take 1 g by mouth daily.     oxyCODONE-acetaminophen 5-325 MG per tablet  Commonly known as:  ROXICET  Take 1 tablet by mouth every 6 (six) hours as needed for severe pain.  Ask about: Which instructions should I use?     oxyCODONE-acetaminophen 5-325 MG per tablet  Commonly known as:  ROXICET  Take 1  tablet by mouth every 6 (six) hours as needed for severe pain.  Ask about: Which instructions should I use?     ranitidine 300 MG tablet  Commonly known as:  ZANTAC  Take 300 mg by mouth at bedtime.     silodosin 8 MG Caps capsule  Commonly known as:  RAPAFLO  Take 1 capsule (8 mg total) by mouth daily with breakfast.     TURMERIC PO  Take 1,000 mg by mouth daily.     URIBEL 118 MG Caps  Take 1 capsule (118 mg total) by mouth 3 times/day as needed-between meals & bedtime.     WHITE WILLOW BARK PO  Take 1 tablet by mouth daily.           Follow-up Information   Follow up with Jethro Bolus I, MD. (per appointment)    Specialty:  Urology   Contact information:   [address] [place] [address] [phone]       Follow up with Jethro Bolus I, MD. (per appointment)    Specialty:  Urology   Contact information:   [address] [place] [address] [phone]      D/c On doxycycline, percocet.  Signed: Kathi Ludwig 06/13/2013, 8:55 AM

## 2013-11-28 ENCOUNTER — Other Ambulatory Visit: Payer: Self-pay | Admitting: Dermatology

## 2014-01-25 ENCOUNTER — Ambulatory Visit (INDEPENDENT_AMBULATORY_CARE_PROVIDER_SITE_OTHER): Payer: Self-pay | Admitting: Cardiovascular Disease

## 2014-01-25 ENCOUNTER — Encounter: Payer: Self-pay | Admitting: Cardiovascular Disease

## 2014-01-25 VITALS — BP 146/86 | HR 64 | Ht 69.0 in | Wt 211.0 lb

## 2014-01-25 DIAGNOSIS — I10 Essential (primary) hypertension: Secondary | ICD-10-CM | POA: Insufficient documentation

## 2014-01-25 NOTE — Assessment & Plan Note (Signed)
Kevin [name] presents today for evaluation of hypertension. I have done a stress test on him approximately 6 years ago that was normal. He was on lisinopril in the past which was changed to losartan 100 mg because of a hoarse voice. He was seen for preoperative dilation before vocal cord surgery next Thursday TO HAVE A BLOOD PRESSURE OF 190/90. hIS BLOOD PRESSURE IN OUR OFFICE TODAY WAS APPROXIMATELY 150/80 TAKEN BY 2 DIFFERENT PEOPLE MANUALLY. i'M UNSURE WHY HIS BLOOD PRESSURE WAS MEASUR[facility] AT [facility]EL [facility] BUT i'M CONCERNED THAT THIS MAY NOT HAVE BEEN TRUE READING. I am going to keep him on losartan 100 mg. I do not think that his blood pressure will be an issue during his surgery..I will see him back when necessary

## 2014-01-25 NOTE — Patient Instructions (Signed)
Follow up as needed

## 2014-01-25 NOTE — Progress Notes (Signed)
01/25/2014 Kevin [name]   Jun 22, 1945  [id number]  Primary Physician Kevin GarbeISOVEC,RICHARD W, MD Primary Cardiologist: Runell GessJonathan J. Heavenly Christine MD Roseanne RenoFACP,FACC,FAHA, FSCAI   HPI:  Mr. Kevin PughFonorow is a 69 year old mildly overweight married Caucasian male who works at [employer]. He has a history of hypertension on ACE inhibitor as in the past. His history otherwise is remarkable for multiple joint replacements as well as prostate resection because of prostate surgery, with ureteral complications. His ACE inhibitor was recently changed to an ARB (losartan) because of a hoarse voice. He probably needs vocal cord surgery next week. He was seen at [facility] today for preoperative evaluation with blood pressure was measured multiple times in the 190/90 range. He presents today for further evaluation. His blood pressure on my personal exam was 150/80. My nurse took his blood pressure at 146/86. I'm wondering whether his blood pressure measurements at [facility] where falsely elevated.   Current Outpatient Prescriptions  Medication Sig Dispense Refill  . DEXILANT 60 MG capsule Take 60 mg by mouth daily.   1  . glucosamine-chondroitin 500-400 MG tablet Take 1 tablet by mouth 3 (three) times daily.    Marland Kitchen. losartan (COZAAR) 100 MG tablet Take 100 mg by mouth daily.   0  . NON FORMULARY Tumeric root extract 1,050mg  daily    . ranitidine (ZANTAC) 300 MG tablet Take 300 mg by mouth at bedtime.    . tadalafil (CIALIS) 20 MG tablet Take 20 mg by mouth daily as needed for erectile dysfunction.    . traMADol (ULTRAM) 50 MG tablet Take 50 mg by mouth as needed.   0  . meloxicam (MOBIC) 15 MG tablet Take 15 mg by mouth as needed.   0  . naproxen sodium (ANAPROX) 220 MG tablet Take 440 mg by mouth 2 (two) times daily with a meal.    . omega-3 acid ethyl esters (LOVAZA) 1 G capsule Take 1 g by mouth daily.     No current facility-administered medications for this visit.    No Known Allergies  History   Social  History  . Marital Status: Married    Spouse Name: N/A    Number of Children: N/A  . Years of Education: N/A   Occupational History  . Not on file.   Social History Main Topics  . Smoking status: Never Smoker   . Smokeless tobacco: Not on file  . Alcohol Use: Yes     Comment: social 1-2 drinks weekly  . Drug Use: No  . Sexual Activity: Yes   Other Topics Concern  . Not on file   Social History Narrative     Review of Systems: General: negative for chills, fever, night sweats or weight changes.  Cardiovascular: negative for chest pain, dyspnea on exertion, edema, orthopnea, palpitations, paroxysmal nocturnal dyspnea or shortness of breath Dermatological: negative for rash Respiratory: negative for cough or wheezing Urologic: negative for hematuria Abdominal: negative for nausea, vomiting, diarrhea, bright red blood per rectum, melena, or hematemesis Neurologic: negative for visual changes, syncope, or dizziness All other systems reviewed and are otherwise negative except as noted above.    Blood pressure 146/86, pulse 64, height 5\' 9"  (1.753 m), weight 211 lb (95.709 kg).  General appearance: alert and no distress Neck: no adenopathy, no carotid bruit, no JVD, supple, symmetrical, trachea midline and thyroid not enlarged, symmetric, no tenderness/mass/nodules Lungs: clear to auscultation bilaterally Heart: regular rate and rhythm, S1, S2 normal, no murmur, click, rub or gallop Extremities: extremities  normal, atraumatic, no cyanosis or edema  EKG not performed today  ASSESSMENT AND PLAN:   Essential hypertension [name] presents today for evaluation of hypertension. I have done a stress test on him approximately 6 years ago that was normal. He was on lisinopril in the past which was changed to losartan 100 mg because of a hoarse voice. He was seen for preoperative dilation before vocal cord surgery next Thursday TO HAVE A BLOOD PRESSURE OF 190/90. hIS BLOOD PRESSURE  IN OUR OFFICE TODAY WAS APPROXIMATELY 150/80 TAKEN BY 2 DIFFERENT PEOPLE MANUALLY. i'M UNSURE WHY HIS BLOOD PRESSURE WAS MEASURED AT SUCH A HIGH LEVEL [facility] BUT i'M CONCERNED THAT THIS MAY NOT HAVE BEEN TRUE READING. I am going to keep him on losartan 100 mg. I do not think that his blood pressure will be an issue during his surgery..I will see him back when necessary      Runell Gess MD [facility], [facility] 01/25/2014 5:31 PM

## 2014-01-26 ENCOUNTER — Encounter: Payer: Self-pay | Admitting: *Deleted

## 2014-01-26 ENCOUNTER — Ambulatory Visit (HOSPITAL_COMMUNITY)
Admission: RE | Admit: 2014-01-26 | Discharge: 2014-01-26 | Disposition: A | Discharge status: Home / Self Care | Payer: 59 | Source: Ambulatory Visit | Attending: Cardiovascular Disease | Admitting: Cardiovascular Disease

## 2014-01-26 ENCOUNTER — Ambulatory Visit (INDEPENDENT_AMBULATORY_CARE_PROVIDER_SITE_OTHER): Payer: 59 | Admitting: *Deleted

## 2014-01-26 VITALS — BP 204/80

## 2014-01-26 DIAGNOSIS — I10 Essential (primary) hypertension: Secondary | ICD-10-CM

## 2014-01-26 DIAGNOSIS — Z79899 Other long term (current) drug therapy: Secondary | ICD-10-CM

## 2014-01-26 MED ORDER — OLMESARTAN MEDOXOMIL-HCTZ 40-12.5 MG PO TABS: 1.0000 | ORAL_TABLET | Freq: Every day | ORAL | Status: DC

## 2014-01-26 MED ORDER — OLMESARTAN MEDOXOMIL-HCTZ 40-12.5 MG PO TABS
1.0000 | ORAL_TABLET | Freq: Every day | ORAL | Status: DC
Start: 2014-01-26 — End: 2014-09-12

## 2014-01-26 NOTE — Progress Notes (Signed)
Renal Artery Duplex Completed. °Brianna L Mazza,RVT °

## 2014-01-26 NOTE — Patient Instructions (Signed)
  We will see you back in follow up in 2 months with Dr Allyson SabalBerry and 1 month with Belenda CruiseKristin for blood pressure evaluation.   Dr Allyson SabalBerry has ordered: renal artery duplex- During this test, an ultrasound is used to evaluate blood flow to the kidneys. Allow one hour for this exam. Do not eat after midnight the day before and avoid carbonated beverages. Take your medications as you usually do.  Stop losartan  Start Benicar HCT 40/12.5mg  daily  Your physician recommends that you return for lab work in: 2 weeks

## 2014-01-26 NOTE — Progress Notes (Signed)
Patient came in with complaints of high blood pressure.  Home monitor reading 220 83.  I checked his blood pressure and got 200/80.  I spoke with Dr Allyson SabalBerry and he wants to change the losartan to benicar hct 40/12.5mg  and get renal doppler.

## 2014-01-29 ENCOUNTER — Ambulatory Visit (INDEPENDENT_AMBULATORY_CARE_PROVIDER_SITE_OTHER): Payer: 59 | Admitting: Pharmacist Clinician (PhC)/ Clinical Pharmacy Specialist

## 2014-01-29 VITALS — BP 180/76 | HR 64 | Ht 69.0 in | Wt 211.0 lb

## 2014-01-29 DIAGNOSIS — I10 Essential (primary) hypertension: Secondary | ICD-10-CM

## 2014-01-29 MED ORDER — AMLODIPINE BESYLATE 5 MG PO TABS: 5.0000 mg | ORAL_TABLET | Freq: Every day | ORAL | Status: DC

## 2014-01-29 NOTE — Patient Instructions (Signed)
Return for a a follow up appointment in   Your blood pressure today is 199/84  Check your blood pressure at home no more than twice daily and keep record of the readings.  Take your BP meds as follows: Benicar HCT 40/12.5 each morning, amlodipine 5 mg each evening.  Take clonidine 0.1 mg only if BP >190 systolic  Find a large cuff for your [employer] machine   HOW TO TAKE YOUR BLOOD PRESSURE: . Rest 5 minutes before taking your blood pressure. .  Don't smoke or drink caffeinated beverages for at least 30 minutes before. . Take your blood pressure before (not after) you eat. . Sit comfortably with your back supported and both feet on the floor (don't cross your legs). . Elevate your arm to heart level on a table or a desk. . Use the proper sized cuff. It should fit smoothly and snugly around your bare upper arm. There should be enough room to slip a fingertip under the cuff. The bottom edge of the cuff should be 1 inch above the crease of the elbow. . Ideally, take 3 measurements at one sitting and record the average.

## 2014-01-30 ENCOUNTER — Encounter (HOSPITAL_COMMUNITY): Payer: Self-pay

## 2014-01-30 ENCOUNTER — Emergency Department (HOSPITAL_COMMUNITY): Payer: 59

## 2014-01-30 ENCOUNTER — Encounter: Payer: Self-pay | Admitting: Pharmacist Clinician (PhC)/ Clinical Pharmacy Specialist

## 2014-01-30 ENCOUNTER — Emergency Department (HOSPITAL_COMMUNITY)
Admission: EM | Admit: 2014-01-30 | Discharge: 2014-01-30 | Disposition: A | Discharge status: Home / Self Care | Payer: 59 | Attending: Emergency Medicine | Admitting: Emergency Medicine

## 2014-01-30 DIAGNOSIS — R51 Headache: Secondary | ICD-10-CM | POA: Diagnosis not present

## 2014-01-30 DIAGNOSIS — M1990 Unspecified osteoarthritis, unspecified site: Secondary | ICD-10-CM | POA: Diagnosis not present

## 2014-01-30 DIAGNOSIS — I10 Essential (primary) hypertension: Secondary | ICD-10-CM

## 2014-01-30 DIAGNOSIS — Z79899 Other long term (current) drug therapy: Secondary | ICD-10-CM | POA: Diagnosis not present

## 2014-01-30 DIAGNOSIS — Z9889 Other specified postprocedural states: Secondary | ICD-10-CM | POA: Diagnosis not present

## 2014-01-30 DIAGNOSIS — R519 Headache, unspecified: Secondary | ICD-10-CM

## 2014-01-30 DIAGNOSIS — Z87442 Personal history of urinary calculi: Secondary | ICD-10-CM | POA: Insufficient documentation

## 2014-01-30 LAB — I-STAT CHEM 8, ED
BUN: 38 mg/dL — ABNORMAL HIGH (ref 6–23)
Calcium, Ion: 1.09 mmol/L — ABNORMAL LOW (ref 1.13–1.30)
Chloride: 103 mEq/L (ref 96–112)
Creatinine, Ser: 0.9 mg/dL (ref 0.50–1.35)
Glucose, Bld: 101 mg/dL — ABNORMAL HIGH (ref 70–99)
HCT: 51 % (ref 39.0–52.0)
Hemoglobin: 17.3 g/dL — ABNORMAL HIGH (ref 13.0–17.0)
Potassium: 5.5 mmol/L — ABNORMAL HIGH (ref 3.5–5.1)
Sodium: 137 mmol/L (ref 135–145)
TCO2: 26 mmol/L (ref 0–100)

## 2014-01-30 MED ORDER — ACETAMINOPHEN 500 MG PO TABS
1000.0000 mg | ORAL_TABLET | Freq: Once | ORAL | Status: AC
  Administered 2014-01-30: 1000 mg via ORAL
  Filled 2014-01-30: qty 2

## 2014-01-30 NOTE — ED Provider Notes (Signed)
CSN: [id number]     Arrival date & time 01/30/14  1229 History   First MD Initiated Contact with Patient 01/30/14 1256     Chief Complaint  Patient presents with  . Hypertension     (Consider location/radiation/quality/duration/timing/severity/associated sxs/prior Treatment) HPI 01/25/14 patient was in preop for dysphonia. Pt noted to have HTN at that time. Pt seen by his Cardiologist Dr. Allyson Sabal. Pt has been monitoring his blood pressure regularly throughout the day. 01/26/14 patient started on Bencar and Clonidine. Pt continues to have HTN despite started new medications.   Last night after taking his first dose of amlodipine the patient began having a headache and blurred vision. Pt currently denies chest pain, shortness of breath Past Medical History  Diagnosis Date  . Hypertension   . History of kidney stones     x1  . Arthritis     osteoarthritis-hips/ shoulders- no issues at present  . Foley catheter in place 05/30/2013    PT HAD SURG 05/30/13 - CYSTO AND CLOT EVACUATION BLADDER / PROSTATE - UNABLE TO VOID AFTER DISCHARGE - CAME BACK TO ER SAME NIGHT AND HAD FOLEY INSERTED TO DRAINAGE BAG - URINE WAS BLOODY BUT PT REPORTS ON 06/07/13 THAT URINE IN BAG MOSTLY CLEAR NOW.   Past Surgical History  Procedure Laterality Date  . Joint replacement      '00-left/'08 RTHA  . Back surgery      lumbar   . Shoulder arthroscopy Right   . Shoulder arthrotomy Left   . Hernia repair      inguinal  . Prostate surgery Bilateral     7- 8 yrs ago  . Cystoscopy w/ ureteroscopy w/ lithotripsy    . Cystoscopy/retrograde/ureteroscopy/stone extraction with basket N/A 05/30/2013    Procedure: CYSTOSCOPY , BLADDER BIOPSY AND CLOT EVACUATION , CAUTERIZATIO OF LATERAL RIGHT AND LEFT LOBE OF PROSTATE;  Surgeon: Kathi Ludwig, MD;  Location: [facility];  Service: Urology;  Laterality: N/A;  . Transurethral resection of prostate N/A 06/09/2013    Procedure: TRANSURETHRAL RESECTION OF THE PROSTATE (TURP) WITH  GYRUS;  Surgeon: Kathi Ludwig, MD;  Location: [facility];  Service: Urology;  Laterality: N/A;   No family history on file. History  Substance Use Topics  . Smoking status: Never Smoker   . Smokeless tobacco: Not on file  . Alcohol Use: Yes     Comment: social 1-2 drinks weekly    Review of Systems  All other systems reviewed and are negative  Allergies  Review of patient's allergies indicates no known allergies.  Home Medications   Prior to Admission medications   Medication Sig Start Date End Date Taking? Authorizing Provider  cloNIDine (CATAPRES) 0.1 MG tablet Take 0.1 mg by mouth 2 (two) times daily. 01/27/14   Historical Provider, MD  DEXILANT 60 MG capsule Take 60 mg by mouth daily.  12/27/13   Historical Provider, MD  glucosamine-chondroitin 500-400 MG tablet Take 1 tablet by mouth 3 (three) times daily.    Historical Provider, MD  meloxicam (MOBIC) 15 MG tablet Take 15 mg by mouth as needed.  01/22/14   Historical Provider, MD  NON FORMULARY Tumeric root extract 1,050mg  daily    Historical Provider, MD  olmesartan-hydrochlorothiazide (BENICAR HCT) 40-12.5 MG per tablet Take 1 tablet by mouth daily. 01/26/14   Runell Gess, MD  omega-3 acid ethyl esters (LOVAZA) 1 G capsule Take 1 g by mouth daily.    Historical Provider, MD  ranitidine (ZANTAC) 300 MG tablet Take 300  mg by mouth at bedtime.    Historical Provider, MD  tadalafil (CIALIS) 20 MG tablet Take 20 mg by mouth daily as needed for erectile dysfunction.    Historical Provider, MD  Testosterone (ANDROGEL PUMP) 20.25 MG/ACT (1.62%) GEL Place onto the skin. Use 4 pumps daily    Historical Provider, MD  traMADol (ULTRAM) 50 MG tablet Take 50 mg by mouth as needed.  12/04/13   Historical Provider, MD   BP 133/59 mmHg  Pulse 54  Temp(Src) 98.2 F (36.8 C) (Oral)  Resp 15  Ht 5\' 9"  (1.753 m)  Wt 200 lb (90.719 kg)  BMI 29.52 kg/m2  SpO2 96% Physical Exam Physical Exam  Nursing note and vitals  reviewed. Constitutional: He is oriented to person, place, and time. He appears well-developed and well-nourished. No distress.  HENT:  Head: Normocephalic and atraumatic.  Eyes: Pupils are equal, round, and reactive to light.  Neck: Normal range of motion.  Cardiovascular: Normal rate and intact distal pulses.   Pulmonary/Chest: No respiratory distress.  Abdominal: Normal appearance. He exhibits no distension.  Musculoskeletal: Normal range of motion.  Neurological: He is alert and oriented to person, place, and time. No cranial nerve deficit.  Skin: Skin is warm and dry. No rash noted.  Psychiatric: He has a normal mood and affect. His behavior is normal.   ED Course  Procedures (including critical care time) Labs Review Labs Reviewed  I-STAT CHEM 8, ED - Abnormal; Notable for the following:    Potassium 5.5 (*)    BUN 38 (*)    Glucose, Bld 101 (*)    Calcium, Ion 1.09 (*)    Hemoglobin 17.3 (*)    All other components within normal limits    Imaging Review No results found.   EKG Interpretation   Date/Time:  Tuesday January 30 2014 12:43:42 EST Ventricular Rate:  67 PR Interval:  195 QRS Duration: 115 QT Interval:  413 QTC Calculation: 436 R Axis:   -3 Text Interpretation:  Sinus rhythm Nonspecific intraventricular conduction  delay Borderline repolarization abnormality Confirmed by Orey Moure  MD,  [place] ([id number]) on 01/30/2014 1:00:18 PM     After treatment in the ED the patient feels back to baseline and wants to go home. MDM   Final diagnoses:  Headache  Essential hypertension        Nelia Shiobert L Zamora Colton, MD 02/02/14 1205

## 2014-01-30 NOTE — Progress Notes (Signed)
01/30/2014 Kevin ComberRobert E [name] January 04, 1946 [id number]   HPI:  Kevin ComberRobert E [name] is a 69 y.o. male patient of Dr Kevin [name], with a PMH below who presents today for hypertension clinic evaluation.  Mr. Kevin PughFonorow was previously treated with lisinopril 40 mg for 6-8 months then switched to losartan 100 mg after he developed some vocal hoarseness.  The hoarseness continued and he was sent to [facility] for possible vocal cord surgery.  At his pre-operative check his BP was noted to be in the 190/90 range.  He then came to Dr. Allyson [name], where he was found to be 150/80 and 146/86.  However, his home readigns were running as high as 213 systolic.  Dr. Allyson [name] switched his losartan 100 mg to Benicar HCT 40/12.5, of which he has only had 3 doses.  When his pressure did not seem to decrease, Dr. Allyson [name] added clonidine 0.1 mg bid on Sunday.  Pt has had 2 doses of that.  He has been rather worried about his readings and checks his pressure at home 3 times over 5-10 minutes, 3-4 times daily.  The readings he brings in show a systolic ranging from 144-213 and diastolic all WNL.    Cardiac Hx: hypertension  Family Hx: father had hypertension for many years, died after MI at age 579.  No cardiac history in mother.  One sister with hypertension.  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  Drinks 2-3 glasses of wine most evenings  Diet: mostly fresh foods, prepares meals from scratch with no added salt  Exercise: daily - yoga, stretches, pilates 2x/week, recumbent bike  Home BP readings: 144-213 systolic at home  Current antihypertensive medications: Benicar HCT 40/12.5 daily, clonidine 0.1 mg bid - started both in past 3 days     Current Outpatient Prescriptions  Medication Sig Dispense Refill  . Testosterone (ANDROGEL PUMP) 20.25 MG/ACT (1.62%) GEL Place onto the skin. Use 4 pumps daily    . amLODipine (NORVASC) 5 MG tablet Take 1 tablet (5 mg total) by mouth daily. 30 tablet 3  .  cloNIDine (CATAPRES) 0.1 MG tablet Take 0.1 mg by mouth 2 (two) times daily.  0  . DEXILANT 60 MG capsule Take 60 mg by mouth daily.   1  . glucosamine-chondroitin 500-400 MG tablet Take 1 tablet by mouth 3 (three) times daily.    . meloxicam (MOBIC) 15 MG tablet Take 15 mg by mouth as needed.   0  . NON FORMULARY Tumeric root extract 1,050mg  daily    . olmesartan-hydrochlorothiazide (BENICAR HCT) 40-12.5 MG per tablet Take 1 tablet by mouth daily. 30 tablet 6  . omega-3 acid ethyl esters (LOVAZA) 1 G capsule Take 1 g by mouth daily.    . ranitidine (ZANTAC) 300 MG tablet Take 300 mg by mouth at bedtime.    . tadalafil (CIALIS) 20 MG tablet Take 20 mg by mouth daily as needed for erectile dysfunction.    . traMADol (ULTRAM) 50 MG tablet Take 50 mg by mouth as needed.   0   No current facility-administered medications for this visit.    No Known Allergies  Past Medical History  Diagnosis Date  . Hypertension   . History of kidney stones     x1  . Arthritis     osteoarthritis-hips/ shoulders- no issues at present  . Foley catheter in place 05/30/2013    PT HAD SURG 05/30/13 - CYSTO AND CLOT EVACUATION BLADDER / PROSTATE - UNABLE  TO VOID AFTER DISCHARGE - CAME BACK TO ER SAME NIGHT AND HAD FOLEY INSERTED TO DRAINAGE BAG - URINE WAS BLOODY BUT PT REPORTS ON 06/07/13 THAT URINE IN BAG MOSTLY CLEAR NOW.    Blood pressure 180/76, pulse 64, height  (1.753 m), weight 211 lb (95.709 kg).    Kevin [name] PharmD CPP [name]

## 2014-01-30 NOTE — Assessment & Plan Note (Signed)
Today his BP is still elevated, although not to the extent his home meter shows.  The cuff from his home meter is small for his arm, and I have recommended that they purchase a large cuff.  Also explained to the patient that it will take several days to see the full effects of these medications.   I would prefer not to use clonidine, other than as a prn medication, so I am going to start him on amlodipine 5mg  each evening in addition to the morning Benicar HCT.  I have asked that he only use the clonidine for systolic readings >180.  I see that Dr. Allyson SabalBerry has ordered a BMET for him and I will see him back in in 3 weeks for follow up.

## 2014-01-30 NOTE — ED Notes (Signed)
01/25/14 patient was in preop for dysphonia. Pt noted to have HTN at that time. Pt seen by his Cardiologist Dr. Allyson SabalBerry. Pt has been monitoring his blood pressure regularly throughout the day. 01/26/14 patient started on Bencar and Clonidine. Pt continues to have HTN despite started new medications.   Around 1200 today patient began having a headache and blurred vision. Pt currently denies chest pain, shortness of breath.

## 2014-01-30 NOTE — Discharge Instructions (Signed)
Take the Benicar HCT and clonidine as we discussed.   DASH Eating Plan DASH stands for "Dietary Approaches to Stop Hypertension." The DASH eating plan is a healthy eating plan that has been shown to reduce high blood pressure (hypertension). Additional health benefits may include reducing the risk of type 2 diabetes mellitus, heart disease, and stroke. The DASH eating plan may also help with weight loss. WHAT DO I NEED TO KNOW ABOUT THE DASH EATING PLAN? For the DASH eating plan, you will follow these general guidelines:  Choose foods with a percent daily value for sodium of less than 5% (as listed on the food label).  Use salt-free seasonings or herbs instead of table salt or sea salt.  Check with your health care provider or pharmacist before using salt substitutes.  Eat lower-sodium products, often labeled as "lower sodium" or "no salt added."  Eat fresh foods.  Eat more vegetables, fruits, and low-fat dairy products.  Choose whole grains. Look for the word "whole" as the first word in the ingredient list.  Choose fish and skinless chicken or [place] more often than red meat. Limit fish, poultry, and meat to 6 oz (170 g) each day.  Limit sweets, desserts, sugars, and sugary drinks.  Choose heart-healthy fats.  Limit cheese to 1 oz (28 g) per day.  Eat more home-cooked food and less restaurant, buffet, and fast food.  Limit fried foods.  Cook foods using methods other than frying.  Limit canned vegetables. If you do use them, rinse them well to decrease the sodium.  When eating at a restaurant, ask that your food be prepared with less salt, or no salt if possible. WHAT FOODS CAN I EAT? Seek help from a dietitian for individual calorie needs. Grains Whole grain or whole wheat bread. Brown rice. Whole grain or whole wheat pasta. Quinoa, bulgur, and whole grain cereals. Low-sodium cereals. Corn or whole wheat flour tortillas. Whole grain cornbread. Whole grain crackers.  Low-sodium crackers. Vegetables Fresh or frozen vegetables (raw, steamed, roasted, or grilled). Low-sodium or reduced-sodium tomato and vegetable juices. Low-sodium or reduced-sodium tomato sauce and paste. Low-sodium or reduced-sodium canned vegetables.  Fruits All fresh, canned (in natural juice), or frozen fruits. Meat and Other Protein Products Ground beef (85% or leaner), grass-fed beef, or beef trimmed of fat. Skinless chicken or [place]. Ground chicken or [place]. Pork trimmed of fat. All fish and seafood. Eggs. Dried beans, peas, or lentils. Unsalted nuts and seeds. Unsalted canned beans. Dairy Low-fat dairy products, such as skim or 1% milk, 2% or reduced-fat cheeses, low-fat ricotta or cottage cheese, or plain low-fat yogurt. Low-sodium or reduced-sodium cheeses. Fats and Oils Tub margarines without trans fats. Light or reduced-fat mayonnaise and salad dressings (reduced sodium). Avocado. Safflower, olive, or canola oils. Natural peanut or almond butter. Other Unsalted popcorn and pretzels. The items listed above may not be a complete list of recommended foods or beverages. Contact your dietitian for more options. WHAT FOODS ARE NOT RECOMMENDED? Grains White bread. White pasta. White rice. Refined cornbread. Bagels and croissants. Crackers that contain trans fat. Vegetables Creamed or fried vegetables. Vegetables in a cheese sauce. Regular canned vegetables. Regular canned tomato sauce and paste. Regular tomato and vegetable juices. Fruits Dried fruits. Canned fruit in light or heavy syrup. Fruit juice. Meat and Other Protein Products Fatty cuts of meat. Ribs, chicken wings, bacon, sausage, bologna, salami, chitterlings, fatback, hot dogs, bratwurst, and packaged luncheon meats. Salted nuts and seeds. Canned beans with salt. Dairy Whole or 2%  milk, cream, half-and-half, and cream cheese. Whole-fat or sweetened yogurt. Full-fat cheeses or blue cheese. Nondairy creamers and whipped  toppings. Processed cheese, cheese spreads, or cheese curds. Condiments Onion and garlic salt, seasoned salt, table salt, and sea salt. Canned and packaged gravies. Worcestershire sauce. Tartar sauce. Barbecue sauce. Teriyaki sauce. Soy sauce, including reduced sodium. Steak sauce. Fish sauce. Oyster sauce. Cocktail sauce. Horseradish. Ketchup and mustard. Meat flavorings and tenderizers. Bouillon cubes. Hot sauce. Tabasco sauce. Marinades. Taco seasonings. Relishes. Fats and Oils Butter, stick margarine, lard, shortening, ghee, and bacon fat. Coconut, palm kernel, or palm oils. Regular salad dressings. Other Pickles and olives. Salted popcorn and pretzels. The items listed above may not be a complete list of foods and beverages to avoid. Contact your dietitian for more information. WHERE CAN I FIND MORE INFORMATION? National Heart, Lung, and Blood Institute: [employer] Document Released: 12/25/2010 Document Revised: 05/22/2013 Document Reviewed: 11/09/2012 [facility] Patient Information 2015 [place], [place]. This information is not intended to replace advice given to you by your health care provider. Make sure you discuss any questions you have with your health care provider.  Hypertension Hypertension, commonly called high blood pressure, is when the force of blood pumping through your arteries is too strong. Your arteries are the blood vessels that carry blood from your heart throughout your body. A blood pressure reading consists of a higher number over a lower number, such as 110/72. The higher number (systolic) is the pressure inside your arteries when your heart pumps. The lower number (diastolic) is the pressure inside your arteries when your heart relaxes. Ideally you want your blood pressure below 120/80. Hypertension forces your heart to work harder to pump blood. Your arteries may become narrow or stiff. Having hypertension puts you at risk for heart  disease, stroke, and other problems.  RISK FACTORS Some risk factors for high blood pressure are controllable. Others are not.  Risk factors you cannot control include:   Race. You may be at higher risk if you are African American.  Age. Risk increases with age.  Gender. Men are at higher risk than women before age 61 years. After age 71, women are at higher risk than men. Risk factors you can control include:  Not getting enough exercise or physical activity.  Being overweight.  Getting too much fat, sugar, calories, or salt in your diet.  Drinking too much alcohol. SIGNS AND SYMPTOMS Hypertension does not usually cause signs or symptoms. Extremely high blood pressure (hypertensive crisis) may cause headache, anxiety, shortness of breath, and nosebleed. DIAGNOSIS  To check if you have hypertension, your health care provider will measure your blood pressure while you are seated, with your arm held at the level of your heart. It should be measured at least twice using the same arm. Certain conditions can cause a difference in blood pressure between your right and left arms. A blood pressure reading that is higher than normal on one occasion does not mean that you need treatment. If one blood pressure reading is high, ask your health care provider about having it checked again. TREATMENT  Treating high blood pressure includes making lifestyle changes and possibly taking medicine. Living a healthy lifestyle can help lower high blood pressure. You may need to change some of your habits. Lifestyle changes may include:  Following the DASH diet. This diet is high in fruits, vegetables, and whole grains. It is low in salt, red meat, and added sugars.  Getting at least 2 hours of brisk physical activity  every week.  Losing weight if necessary.  Not smoking.  Limiting alcoholic beverages.  Learning ways to reduce stress. If lifestyle changes are not enough to get your blood pressure  under control, your health care provider may prescribe medicine. You may need to take more than one. Work closely with your health care provider to understand the risks and benefits. HOME CARE INSTRUCTIONS  Have your blood pressure rechecked as directed by your health care provider.   Take medicines only as directed by your health care provider. Follow the directions carefully. Blood pressure medicines must be taken as prescribed. The medicine does not work as well when you skip doses. Skipping doses also puts you at risk for problems.   Do not smoke.   Monitor your blood pressure at home as directed by your health care provider. SEEK MEDICAL CARE IF:   You think you are having a reaction to medicines taken.  You have recurrent headaches or feel dizzy.  You have swelling in your ankles.  You have trouble with your vision. SEEK IMMEDIATE MEDICAL CARE IF:  You develop a severe headache or confusion.  You have unusual weakness, numbness, or feel faint.  You have severe chest or abdominal pain.  You vomit repeatedly.  You have trouble breathing. MAKE SURE YOU:   Understand these instructions.  Will watch your condition.  Will get help right away if you are not doing well or get worse. Document Released: 01/05/2005 Document Revised: 05/22/2013 Document Reviewed: 10/28/2012 [facility] Patient Information 2015 [place], [place]. This information is not intended to replace advice given to you by your health care provider. Make sure you discuss any questions you have with your health care provider.

## 2014-01-31 ENCOUNTER — Telehealth: Payer: Self-pay | Admitting: Cardiovascular Disease

## 2014-01-31 NOTE — Telephone Encounter (Signed)
I spoke with patient.  He has been experiencing headaches around noon for the past two days. He went to the ER recently with one and was discharged that same day.  Today he came home from work with a headache. His bp was 220/78 185/52 166/79 155/76.  He has been taking his Benicar Hct in the am around 6.  Then, 2 1/2 hours later he takes his bp; if his bp is >180 he takes .1mg  of clonidine.  Other bp readings have been from 126/63-198/83.  I advised to cut the benicar in half and take 1/2 bid and I made him an appt with Belenda CruiseKristin on Friday.  He will call with any problems.

## 2014-01-31 NOTE — Telephone Encounter (Signed)
Pt called in wanting to speak to Samara DeistKathryn about some side effects that he is having with his Clonidine medication. He says that after he takes it, he has really bad headaches and some dizziness. Please call  thanks

## 2014-02-02 ENCOUNTER — Encounter: Payer: Self-pay | Admitting: Pharmacist Clinician (PhC)/ Clinical Pharmacy Specialist

## 2014-02-02 ENCOUNTER — Ambulatory Visit (INDEPENDENT_AMBULATORY_CARE_PROVIDER_SITE_OTHER): Payer: 59 | Admitting: Pharmacist Clinician (PhC)/ Clinical Pharmacy Specialist

## 2014-02-02 VITALS — BP 176/88

## 2014-02-02 DIAGNOSIS — I10 Essential (primary) hypertension: Secondary | ICD-10-CM

## 2014-02-02 NOTE — Assessment & Plan Note (Addendum)
Today his pressure in the office is still elevated at 176/78.  He talks about the stress of his job and is trying to determine how much it's affecting his BP.  He is willing to re-challenge with the amlodipine, as I don't believe it was the cause of his headache.  I have suggested that he continue with the Benicar HCT 40/12.5 at 1/2 tablet twice daily and try adding 1/2 tablet of amlodipine to that tomorrow morning.  He also has some anti-anxiety medication at home and I suggested that if he is starting to feel anxious, that this might be good for him to use.  He was originally set to see me on February 1 and I have asked that they keep this appt.  In the meantime, he will continue to check his home BP no more than twice daily.   He can use the clonidine if his pressure is >180, however I did ask that he hold off on that tomorrow, to give the amlodipine a chance to work.  (I think the clonidine is much more likely to have caused his headaches).

## 2014-02-02 NOTE — Progress Notes (Signed)
02/02/2014 Kevin [name] 05-19-1945 [id number]   HPI:  Kevin [name] is a 69 y.o. male patient of Dr Allyson Sabal, with a PMH below who presents today for hypertension clinic evaluation.  Mr. Nack was previously treated with lisinopril 40 mg for 6-8 months then switched to losartan 100 mg after he developed some vocal hoarseness.  The hoarseness continued and he was sent to [facility] for possible vocal cord surgery.  At his pre-operative check his BP was noted to be in the 190/90 range.  He then came to Dr. Allyson Sabal, where he was found to be 150/80 and 146/86.  However, his home readigns were running as high as 213 systolic.  Dr. Allyson Sabal switched his losartan 100 mg to Benicar HCT 40/12.5, of which he has only had 3 doses.  When his pressure did not seem to decrease, Dr. Allyson Sabal added clonidine 0.1 mg bid on Sunday.  Pt has had 2 doses of that.  He has been rather worried about his readings and checks his pressure at home 3 times over 5-10 minutes, 3-4 times daily.  The readings he brings in show a systolic ranging from 144-213 and diastolic all WNL.    When I saw him on Monday I added amlodipine 5 mg once daily in the evenings to his regimen.  He developed a headache that night that mostly resolved by morning, but by noon was seeing white spots and the headache was worse.  He spoke with Dr. Allyson Sabal who advised him to go to the ER.  He states that his pressure was >200 systolic when he arrived and by the time he left was down to the 120s.  The ER recorded his BP at 133/59.  Since then he has divided the Benicar HCT to 1/2 tablet twice daily and stopped the amlodipine altogether.    Cardiac Hx: hypertension  Family Hx: father had hypertension for many years, died after MI at age 65.  No cardiac history in mother.  One sister with hypertension.  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  Drinks 2-3 glasses of wine most evenings  Diet: mostly fresh foods, prepares meals  from scratch with no added salt  Exercise: daily - yoga, stretches, pilates 2x/week, recumbent bike  Home BP readings: while home readings still tend to be quite erratic, I did note that his morning pressure was always >200 to start, but by evenings was 140-160s.  Current antihypertensive medications: Benicar HCT 40/12.5 0.5 tab bid;  clonidine 0.1 mg bid prn BP >180 systolic -      Current Outpatient Prescriptions  Medication Sig Dispense Refill  . amLODipine (NORVASC) 5 MG tablet Take 2.5 mg by mouth daily.  0  . cloNIDine (CATAPRES) 0.1 MG tablet Take 0.1 mg by mouth 2 (two) times daily.  0  . DEXILANT 60 MG capsule Take 60 mg by mouth daily.   1  . glucosamine-chondroitin 500-400 MG tablet Take 1 tablet by mouth 3 (three) times daily.    . meloxicam (MOBIC) 15 MG tablet Take 15 mg by mouth as needed.   0  . NON FORMULARY Tumeric root extract 1,050mg  daily    . olmesartan-hydrochlorothiazide (BENICAR HCT) 40-12.5 MG per tablet Take 1 tablet by mouth daily. 30 tablet 6  . omega-3 acid ethyl esters (LOVAZA) 1 G capsule Take 1 g by mouth daily.    . ranitidine (ZANTAC) 300 MG tablet Take 300 mg by mouth at bedtime.    Marland Kitchen  tadalafil (CIALIS) 20 MG tablet Take 20 mg by mouth daily as needed for erectile dysfunction.    . Testosterone (ANDROGEL PUMP) 20.25 MG/ACT (1.62%) GEL Place onto the skin. Use 4 pumps daily    . traMADol (ULTRAM) 50 MG tablet Take 50 mg by mouth as needed.   0   No current facility-administered medications for this visit.    No Known Allergies  Past Medical History  Diagnosis Date  . Hypertension   . History of kidney stones     x1  . Arthritis     osteoarthritis-hips/ shoulders- no issues at present  . Foley catheter in place 05/30/2013    PT HAD SURG 05/30/13 - CYSTO AND CLOT EVACUATION BLADDER / PROSTATE - UNABLE TO VOID AFTER DISCHARGE - CAME BACK TO ER SAME NIGHT AND HAD FOLEY INSERTED TO DRAINAGE BAG - URINE WAS BLOODY BUT PT REPORTS ON 06/07/13 THAT URINE  IN BAG MOSTLY CLEAR NOW.    Blood pressure 176/88.    [name] PharmD CPP [facility]

## 2014-02-02 NOTE — Patient Instructions (Signed)
Return for a a follow up appointment on February 1  Your blood pressure today is 176/78  Check your blood pressure at home daily (if able) and keep record of the readings.  Take your BP meds as follows:  AM:  1/2 benicar hct 40/12.5 + 1/2 amlodipine 5 mg PM:   1/2 benicar hct 40/12.5  Take 0.1 mg clonidine if BP is > 180/100  Bring your record of home blood pressures to your next appointment.     HOW TO TAKE YOUR BLOOD PRESSURE: . Rest 5 minutes before taking your blood pressure. .  Don't smoke or drink caffeinated beverages for at least 30 minutes before. . Take your blood pressure before (not after) you eat. . Sit comfortably with your back supported and both feet on the floor (don't cross your legs). . Elevate your arm to heart level on a table or a desk. . Use the proper sized cuff. It should fit smoothly and snugly around your bare upper arm. There should be enough room to slip a fingertip under the cuff. The bottom edge of the cuff should be 1 inch above the crease of the elbow. . Ideally, take 3 measurements at one sitting and record the average.

## 2014-02-12 ENCOUNTER — Telehealth: Payer: Self-pay | Admitting: Pharmacist Clinician (PhC)/ Clinical Pharmacy Specialist

## 2014-02-12 MED ORDER — HYDRALAZINE HCL 25 MG PO TABS: 25.0000 mg | ORAL_TABLET | Freq: Two times a day (BID) | ORAL | Status: DC

## 2014-02-12 NOTE — Telephone Encounter (Signed)
LMOM for patient to return call.  Pt in contact with Dr. Allyson SabalBerry last week, BP still running high, 200 systolic at home.  Dr. Allyson SabalBerry increased amlodipine to 5 mg qd.

## 2014-02-12 NOTE — Telephone Encounter (Signed)
Pt BP was as low as 167/62, then back up over 200 systolic over weekend.  Reviewed with Dr. Allyson SabalBerry and patient.  Will have him take benicar hct and hydralazine 25mg  each am, amlodipine 5 mg and hydralazine 25mg  each pm.  Keep appt for next Monday.  Pt voiced understanding

## 2014-02-19 ENCOUNTER — Ambulatory Visit (INDEPENDENT_AMBULATORY_CARE_PROVIDER_SITE_OTHER): Payer: 59 | Admitting: Pharmacist Clinician (PhC)/ Clinical Pharmacy Specialist

## 2014-02-19 VITALS — BP 162/78 | HR 72

## 2014-02-19 DIAGNOSIS — I10 Essential (primary) hypertension: Secondary | ICD-10-CM

## 2014-02-19 MED ORDER — HYDRALAZINE HCL 50 MG PO TABS: 50.0000 mg | ORAL_TABLET | Freq: Two times a day (BID) | ORAL | Status: DC

## 2014-02-19 NOTE — Patient Instructions (Signed)
Return for a a follow up appointment in 3 weeks   Your blood pressure today is 162/78  Check your blood pressure at home daily (if able) and keep record of the readings.  Take your BP meds as follows: continue with Benicar hct and amlodipine, increase hydralazine to 50 mg twice daily  Bring all of your meds, your BP cuff and your record of home blood pressures to your next appointment.  Exercise as you're able, try to walk approximately 30 minutes per day.  Keep salt intake to a minimum, especially watch canned and prepared boxed foods.  Eat more fresh fruits and vegetables and fewer canned items.  Avoid eating in fast food restaurants.    HOW TO TAKE YOUR BLOOD PRESSURE: . Rest 5 minutes before taking your blood pressure. .  Don't smoke or drink caffeinated beverages for at least 30 minutes before. . Take your blood pressure before (not after) you eat. . Sit comfortably with your back supported and both feet on the floor (don't cross your legs). . Elevate your arm to heart level on a table or a desk. . Use the proper sized cuff. It should fit smoothly and snugly around your bare upper arm. There should be enough room to slip a fingertip under the cuff. The bottom edge of the cuff should be 1 inch above the crease of the elbow. . Ideally, take 3 measurements at one sitting and record the average.

## 2014-02-20 ENCOUNTER — Encounter: Payer: Self-pay | Admitting: Pharmacist Clinician (PhC)/ Clinical Pharmacy Specialist

## 2014-02-20 ENCOUNTER — Telehealth: Payer: Self-pay | Admitting: Pharmacist Clinician (PhC)/ Clinical Pharmacy Specialist

## 2014-02-20 NOTE — Progress Notes (Signed)
02/20/2014 Kevin ComberRobert E Misenheimer Jan 31, 1945 [id number]   HPI:  Kevin [name] is a 69 y.o. male patient of Dr Allyson SabalBerry, with a PMH below who presents today for hypertension clinic follow up.  He was originally treated with lilsinopril 40 mg, then losartan 100 mg.  When he went for pre-op for vocal cord surgery he was found to have a BP of 190/90.  Since then he was started on Benicar HCT 40/12.5 and clonidine 0.1 mg bid, with amlodipine 5 mg added later.  He ended up with some severe headaches, leading to an ER visit.  We assume this was related to the clonidine and have discontinued it for now.  At his last visit his pressures were still mostly unchanged at 176/88.  Home readings were similar.  At that time we added hydralazine 25 mg bid.  Since that visit he also saw his primary MD who prescribed Zoloft to help with some anxiety.  He had to stop after 1 week because it actually made the anxiety worse.    In the past week his BP at home has finally started to decrease.  In the past 6 days his highest reading was 161/78 and the lowest 148/65.  All diastolic readings for the past 2 weeks were WNL, with the highest diastolic at 218.  Cardiac Hx: hypertension  Family Hx: father had hypertension for many years, died after MI at age 68.  No cardiac history in mother.  One sister with hypertension.  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  Drinks 2-3 glasses of wine most evenings  Diet: mostly fresh foods, prepares meals from scratch with no added salt  Exercise: daily - yoga, stretches, pilates 2x/week, recumbent bike  Current antihypertensive medications: Benicar HCT 40/12.5 qd (am), amlodipine 5 mg qd (pm), hydralazine 25 mg bid   Current Outpatient Prescriptions  Medication Sig Dispense Refill  . amLODipine (NORVASC) 5 MG tablet Take 2.5 mg by mouth daily.  0  . cloNIDine (CATAPRES) 0.1 MG tablet Take 0.1 mg by mouth 2 (two) times daily.  0  . DEXILANT 60 MG  capsule Take 60 mg by mouth daily.   1  . glucosamine-chondroitin 500-400 MG tablet Take 1 tablet by mouth 3 (three) times daily.    . hydrALAZINE (APRESOLINE) 50 MG tablet Take 1 tablet (50 mg total) by mouth 2 (two) times daily. 60 tablet 3  . meloxicam (MOBIC) 15 MG tablet Take 15 mg by mouth as needed.   0  . NON FORMULARY Tumeric root extract 1,050mg  daily    . olmesartan-hydrochlorothiazide (BENICAR HCT) 40-12.5 MG per tablet Take 1 tablet by mouth daily. 30 tablet 6  . omega-3 acid ethyl esters (LOVAZA) 1 G capsule Take 1 g by mouth daily.    . ranitidine (ZANTAC) 300 MG tablet Take 300 mg by mouth at bedtime.    . tadalafil (CIALIS) 20 MG tablet Take 20 mg by mouth daily as needed for erectile dysfunction.    . Testosterone (ANDROGEL PUMP) 20.25 MG/ACT (1.62%) GEL Place onto the skin. Use 4 pumps daily    . traMADol (ULTRAM) 50 MG tablet Take 50 mg by mouth as needed.   0   No current facility-administered medications for this visit.    No Known Allergies  Past Medical History  Diagnosis Date  . Hypertension   . History of kidney stones     x1  . Arthritis     osteoarthritis-hips/ shoulders-  no issues at present  . Foley catheter in place 05/30/2013    PT HAD SURG 05/30/13 - CYSTO AND CLOT EVACUATION BLADDER / PROSTATE - UNABLE TO VOID AFTER DISCHARGE - CAME BACK TO ER SAME NIGHT AND HAD FOLEY INSERTED TO DRAINAGE BAG - URINE WAS BLOODY BUT PT REPORTS ON 06/07/13 THAT URINE IN BAG MOSTLY CLEAR NOW.    Blood pressure 162/78, pulse 72.    [name] PharmD CPP [facility]

## 2014-02-20 NOTE — Telephone Encounter (Signed)
Pt called this am, LMOM, took increased dose of hydralazine last night and this am.  Felt some dizziness last night, but went to bed.  This am after taking 50 mg dose was dizzy, lightheaded and flushed feeling for 2-3 hours.    Advised patient that this can happen with increased dose.  Suggested that he continue to take 50 mg each evening, but take 37.5 mg each morning for 3-4 days to help acclimate to higher dose

## 2014-02-20 NOTE — Assessment & Plan Note (Signed)
Today we are finally seeing his BP start to decline.  While he has still had 2 readings >200 systolic, they have had a steady and gradual decline.  I will increase his hydralazine to 50 mg bid starting today.  He would like to increase the intensity of his exercise and I told him that this would be fine, as long as he worked up to a high intensity.    He is to continue with home BP monitoring and is due for follow up with Dr Allyson SabalBerry the first week of March.  He knows to call if he has any concerns and I can see him after that should we still need to monitor.

## 2014-02-26 ENCOUNTER — Encounter: Payer: Self-pay | Admitting: Pharmacist Clinician (PhC)/ Clinical Pharmacy Specialist

## 2014-03-06 ENCOUNTER — Telehealth: Payer: Self-pay | Admitting: Pharmacist Clinician (PhC)/ Clinical Pharmacy Specialist

## 2014-03-06 NOTE — Telephone Encounter (Signed)
Pt states still having some dizziness/lightheadedness each morning after taking hydralazine 50 mg.  Was suggested to decrease to 37.5 mg in am, but pt did not, as BP still running 140-160s systolic, and per patient it was a "manageable" symptom.  Suggested patient try taking med with breakfast each morning, rather than an hour or so prior.    He is to call after 1 week to let us know if side effects decrease.  If still a problem, would suggest decreasing morning dose to 25 or 37.5 (whichever is more tolerable) and increase amlodipine to 10 mg each evening.    Will review with Dr. Allyson SabalBerry before his appointment in March.  May need to look for secondary causes of hypertension.  Would consider drawing a renin and angiotensin lab as well.

## 2014-03-27 ENCOUNTER — Ambulatory Visit (INDEPENDENT_AMBULATORY_CARE_PROVIDER_SITE_OTHER): Payer: 59 | Admitting: Cardiovascular Disease

## 2014-03-27 ENCOUNTER — Encounter: Payer: Self-pay | Admitting: Cardiovascular Disease

## 2014-03-27 VITALS — BP 127/67 | HR 59 | Ht 69.0 in | Wt 208.9 lb

## 2014-03-27 DIAGNOSIS — I10 Essential (primary) hypertension: Secondary | ICD-10-CM

## 2014-03-27 NOTE — Assessment & Plan Note (Signed)
Kevin [name] blood pressure is much better better today measured at 127/67 with a large cuff. He is on hydralazine, Benicar, thiazide. His amlodipine was discontinued. He feels quickly improved. He does complain of some vague right eye white scintillating lights spelled occur several times a week but sound like an ocular migraine. I do not think these are cardiovascular in nature. I am going to discuss this with the neurologist I will see him back in 6 months.

## 2014-03-27 NOTE — Progress Notes (Signed)
03/27/2014 Kevin [name]   12/11/1945  [id number]  Primary Physician Gaspar Garbe, MD Primary Cardiologist: Runell Gess MD Roseanne Reno  HPI: Kevin [name] is a 69 year old mildly overweight married Caucasian male who works at [employer]. L last saw him in the office 01/25/14.He has a history of hypertension on ACE inhibitor as in the past. His history otherwise is remarkable for multiple joint replacements as well as prostate resection because of prostate surgery, with ureteral complications. His ACE inhibitor was recently changed to an ARB (losartan) because of a hoarse voice. He probably needs vocal cord surgery next week. He was seen at [facility] today for preoperative evaluation with blood pressure was measured multiple times in the 190/90 range. Since I saw him in the office we have been titrating his blood pressure medicines to the point now that his blood pressure is much better controlled in the 130/6070 range. He currently is on hydralazine, Benicar and hard core thiazide. He was on amlodipine in addition which has since been discontinued. He denies chest pain or shortness of breath. Does have some vague ocular phenomenon for which he saw his ophthalmologist. 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  Current Outpatient Prescriptions  Medication Sig Dispense Refill  . amLODipine (NORVASC) 5 MG tablet Take 2.5 mg by mouth daily.  0  . DEXILANT 60 MG capsule Take 60 mg by mouth daily.   1  . glucosamine-chondroitin 500-400 MG tablet Take 1 tablet by mouth 3 (three) times daily.    . hydrALAZINE (APRESOLINE) 50 MG tablet Take 1 tablet (50 mg total) by mouth 2 (two) times daily. 60 tablet 3  . meloxicam (MOBIC) 15 MG tablet Take 15 mg by mouth as needed.   0  . NON FORMULARY Tumeric root extract 1,050mg  daily    . olmesartan-hydrochlorothiazide (BENICAR HCT) 40-12.5 MG per tablet Take 1 tablet by mouth daily. 30 tablet 6  . omega-3 acid ethyl esters  (LOVAZA) 1 G capsule Take 1 g by mouth daily.    . ranitidine (ZANTAC) 300 MG tablet Take 300 mg by mouth at bedtime.    . tadalafil (CIALIS) 20 MG tablet Take 20 mg by mouth daily as needed for erectile dysfunction.    . Testosterone (ANDROGEL PUMP) 20.25 MG/ACT (1.62%) GEL Place onto the skin. Use 4 pumps daily    . traMADol (ULTRAM) 50 MG tablet Take 50 mg by mouth as needed.   0   No current facility-administered medications for this visit.    No Known Allergies  History   Social History  . Marital Status: Married    Spouse Name: N/A  . Number of Children: N/A  . Years of Education: N/A   Occupational History  . Not on file.   Social History Main Topics  . Smoking status: Never Smoker   . Smokeless tobacco: Not on file  . Alcohol Use: Yes     Comment: social 1-2 drinks weekly  . Drug Use: No  . Sexual Activity: Yes   Other Topics Concern  . Not on file   Social History Narrative     Review of Systems: General: negative for chills, fever, night sweats or weight changes.  Cardiovascular: negative for chest pain, dyspnea on exertion, edema, orthopnea, palpitations, paroxysmal nocturnal dyspnea or shortness of breath Dermatological: negative for rash Respiratory: negative for cough or wheezing Urologic: negative for hematuria Abdominal: negative for nausea, vomiting, diarrhea, bright red blood per rectum, melena, or hematemesis Neurologic:  negative for visual changes, syncope, or dizziness All other systems reviewed and are otherwise negative except as noted above.    Blood pressure 127/67, pulse 59, height 5\' 9"  (1.753 m), weight 208 lb 14.4 oz (94.756 kg).  General appearance: alert and no distress Neck: no adenopathy, no carotid bruit, no JVD, supple, symmetrical, trachea midline and thyroid not enlarged, symmetric, no tenderness/mass/nodules Lungs: clear to auscultation bilaterally Heart: regular rate and rhythm, S1, S2 normal, no murmur, click, rub or  gallop Extremities: extremities normal, atraumatic, no cyanosis or edema  EKG not performed today  ASSESSMENT AND PLAN:   Essential hypertension Bobst blood pressure is much better better today measured at 127/67 with a large cuff. He is on hydralazine, Benicar, thiazide. His amlodipine was discontinued. He feels quickly improved. He does complain of some vague right eye white scintillating lights spelled occur several times a week but sound like an ocular migraine. I do not think these are cardiovascular in nature. I am going to discuss this with the neurologist I will see him back in 6 months.       Runell GessJonathan J. Kessler Kopinski MD FACP,FACC,FAHA, [facility] 03/27/2014 4:09 PM

## 2014-03-27 NOTE — Patient Instructions (Signed)
Your physician wants you to follow-up in: 6 months with Dr Berry. You will receive a reminder letter in the mail two months in advance. If you don't receive a letter, please call our office to schedule the follow-up appointment.  

## 2014-03-28 ENCOUNTER — Telehealth (HOSPITAL_COMMUNITY): Payer: Self-pay | Admitting: *Deleted

## 2014-03-28 ENCOUNTER — Telehealth: Payer: Self-pay | Admitting: *Deleted

## 2014-03-28 DIAGNOSIS — H539 Unspecified visual disturbance: Secondary | ICD-10-CM

## 2014-03-28 NOTE — Telephone Encounter (Signed)
Dr Allyson SabalBerry sent me a message asking me to order some carotid dopplers for Mr Kevin [name] due to his visual symptoms.  I called and informed the patient and he is agreeable to proceed with the dopplers.   Order placed.

## 2014-03-30 ENCOUNTER — Ambulatory Visit (HOSPITAL_COMMUNITY)
Admission: RE | Admit: 2014-03-30 | Discharge: 2014-03-30 | Disposition: A | Discharge status: Home / Self Care | Payer: 59 | Source: Ambulatory Visit | Attending: Cardiovascular Disease | Admitting: Cardiovascular Disease

## 2014-03-30 DIAGNOSIS — H539 Unspecified visual disturbance: Secondary | ICD-10-CM | POA: Insufficient documentation

## 2014-03-30 NOTE — Progress Notes (Signed)
Carotid Duplex Completed. °Brianna L Mazza,RVT °

## 2014-04-04 ENCOUNTER — Telehealth: Payer: Self-pay | Admitting: *Deleted

## 2014-04-04 NOTE — Telephone Encounter (Signed)
Called patient to schedule his appointment with Dr Pearlean BrownieSethi, patient was offered first available for today but could not accept, patient stated that he could not schedule, that he was in the middle of something and requested a call back around 2:30, informed patient that Dr Pearlean BrownieSethi starts back seeing patients at 2 pm and may not be able to call to schedule appointment with him, and he should call back.

## 2014-04-09 ENCOUNTER — Encounter: Payer: Self-pay | Admitting: *Deleted

## 2014-04-18 ENCOUNTER — Telehealth: Payer: Self-pay | Admitting: Radiology

## 2014-04-18 NOTE — Telephone Encounter (Signed)
Received a voice message from patient stating someone called him earlier this month and he could not schedule appointment at that time.  He also indicattes he should have a TCD.  I returned the call and left a voice message on the [phone] phone line.  I do not see an order for a TCD, but do see the telephone note from Dr. Marlis EdelsonSethi's clinic assistant.  I will forward this message to her so they can schedule the appointment she referred to earlier in her phone note.  I let the patient know in my voice message that she would be calling him back.    [phone] or [phone].

## 2014-04-20 ENCOUNTER — Telehealth: Payer: Self-pay | Admitting: *Deleted

## 2014-04-20 NOTE — Telephone Encounter (Signed)
I spoke with patient to let him know I would let Dr. Pearlean BrownieSethi know patient does not have an order for the TCD he was supposed to schedule.  See additional notes in chart.  Patient expects a call to schedule 04/23/14

## 2014-04-23 NOTE — Telephone Encounter (Signed)
Spoke to Mr. Kevin [name]. He states his cardiologist Dr. Allyson SabalBerry and Dr. Pearlean BrownieSethi spoke about him having a TCD and he would like to get it scheduled. Not showing TCD order,  OV referral, or telephone conversation b/n MD's.  Advised patient of referral process, advised to have Dr. Hazle CocaBerry's office to send over either order for TCD or referral for OV with Dr. Pearlean BrownieSethi. Patient agreed and verbalized understanding.

## 2014-04-23 NOTE — Telephone Encounter (Signed)
I spoke to pt`s wife and offered appointment this week with Dr Roda ShuttersXu but they prefer to see me hence arrange for new pt consult appointment with me next week

## 2014-04-25 NOTE — Telephone Encounter (Signed)
I made appt with pt after reading Dr. Marlis EdelsonSethi's note.  04-30-14 at 0900, be here 0830 for NP appt.

## 2014-04-30 ENCOUNTER — Encounter: Payer: Self-pay | Admitting: Neurology

## 2014-04-30 ENCOUNTER — Ambulatory Visit (INDEPENDENT_AMBULATORY_CARE_PROVIDER_SITE_OTHER): Payer: 59 | Admitting: Neurology

## 2014-04-30 ENCOUNTER — Encounter: Payer: Self-pay | Admitting: Podiatry

## 2014-04-30 ENCOUNTER — Ambulatory Visit (INDEPENDENT_AMBULATORY_CARE_PROVIDER_SITE_OTHER): Payer: 59

## 2014-04-30 ENCOUNTER — Ambulatory Visit (INDEPENDENT_AMBULATORY_CARE_PROVIDER_SITE_OTHER): Payer: 59 | Admitting: Podiatry

## 2014-04-30 VITALS — BP 147/70 | HR 74 | Ht 69.0 in | Wt 214.6 lb

## 2014-04-30 VITALS — BP 132/68 | HR 62 | Resp 12

## 2014-04-30 DIAGNOSIS — R52 Pain, unspecified: Secondary | ICD-10-CM

## 2014-04-30 DIAGNOSIS — H53133 Sudden visual loss, bilateral: Secondary | ICD-10-CM | POA: Diagnosis not present

## 2014-04-30 DIAGNOSIS — M898X7 Other specified disorders of bone, ankle and foot: Secondary | ICD-10-CM

## 2014-04-30 DIAGNOSIS — M2570 Osteophyte, unspecified joint: Secondary | ICD-10-CM

## 2014-04-30 MED ORDER — ASPIRIN 81 MG PO TBEC: 81.0000 mg | DELAYED_RELEASE_TABLET | Freq: Every day | ORAL | Status: DC

## 2014-04-30 NOTE — Patient Instructions (Signed)
If you want to consider surgery scheduled point with Dr. Vaughan SineMatthew Wagner in our practice

## 2014-04-30 NOTE — Progress Notes (Signed)
   Subjective:    Patient ID: Clois ComberRobert E Hartsfield, male    DOB: [birth year], 69 y.o.   MRN: [id number]  HPI  N-SORE L-RT FOOT FIRST TOE D-4 YEARS O-SLOWLY C-WORSE A-DRESS SHOES T-FOOT CREAM  This patient presents today complaining of pain localized to the dorsal medial interphalangeal joint area of the right hallux. The symptoms occur with physical activity including hiking, and further aggravates when he wears a dress style shoe. He has attempted local protection of the area with a digital sleeve which provided minimal relief of symptoms. He describes a rather continuous discomfort localized to this area.  Review of Systems  All other systems reviewed and are negative.      Objective:   Physical Exam  Orientated 3  Vascular: DP and PT pulses 2/4 bilaterally Capillary reflex immediate bilaterally  Dermatological: Locally area of erythema medial right hallux interphalangeal joint without any warmth or drainage and area  Neurological: Stable gait  Musculoskeletal: No restriction of right hallux interphalangeal joint with palpable tenderness in the dorsal medial right hallux interphalangeal joint Rigid hammertoes toe second right  X-ray examination today demonstrates hypertrophy in the medial interphalangeal joint with some small bony protrusion in the right hallux interphalangeal joint area.      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 Minimal wear that surgical procedures on the great toe with require limited standing walking anywhere from 8-12 weeks. I answered all patient's questions.  At this time patient would like to try additional padding and  shoe modification prior to surgical treatment I dispensed a digital pad to wear over the right hallux interphalangeal joint  I also made patient aware that he wish to consider surgery in the future to contact Dr. Vaughan SineMatthew Wagner for further evaluation consultation

## 2014-04-30 NOTE — Patient Instructions (Addendum)
I had a long discussion with the patient with his recurrent episodes of transient vision loss which are of unclear etiology. I reviewed available imaging and lab studies as well as a referral notes and answered questions about my evaluation and differential diagnosis Possibilities include temporal arteritis without a headache versus vertebrobasilar   or small vessel retinal ischemia. Recommend he start taking aspirin 81 mg daily and check ESR, ANA panel and MRI scan of the brain with MRA of the brain and neck. May consider temporal artery biopsy at follow-up visit with his about tests are needing. Start taking enteric-coated aspirin 81 mg daily. Maintain strict control of hypertension with blood pressure goal below 140/90. Return for follow-up in 4 weeks or call earlier if necessary. Blurred Vision You have been seen today complaining of blurred vision. This means you have a loss of ability to see small details.  CAUSES  Blurred vision can be a symptom of underlying eye problems, such as:  Aging of the eye (presbyopia).  Glaucoma.  Cataracts.  Eye infection.  Eye-related migraine.  Diabetes mellitus.  Fatigue.  Migraine headaches.  High blood pressure.  Breakdown of the back of the eye (macular degeneration).  Problems caused by some medications. The most common cause of blurred vision is the need for eyeglasses or a new prescription. Today in the emergency department, no cause for your blurred vision can be found. SYMPTOMS  Blurred vision is the loss of visual sharpness and detail (acuity). DIAGNOSIS  Should blurred vision continue, you should see your caregiver. If your caregiver is your primary care physician, he or she may choose to refer you to another specialist.  TREATMENT  Do not ignore your blurred vision. Make sure to have it checked out to see if further treatment or referral is necessary. SEEK MEDICAL CARE IF:  You are unable to get into a specialist so we can help you  with a referral. SEEK IMMEDIATE MEDICAL CARE IF: You have severe eye pain, severe headache, or sudden loss of vision. MAKE SURE YOU:   Understand these instructions.  Will watch your condition.  Will get help right away if you are not doing well or get worse. Document Released: 01/08/2003 Document Revised: 03/30/2011 Document Reviewed: 08/10/2007 [facility] Patient Information 2015 [place], [place]. This information is not intended to replace advice given to you by your health care provider. Make sure you discuss any questions you have with your health care provider.

## 2014-04-30 NOTE — Addendum Note (Signed)
Addended by: Gates RiggSETHI, PRAMODKUMAR P on: 04/30/2014 10:10 AM   Modules accepted: Orders

## 2014-04-30 NOTE — Progress Notes (Signed)
[facility] [address] [place]. [place] [address] [phone]       OFFICE CONSULT NOTE  Kevin [name] Date of Birth:  [birth year] Medical Record Number:  [id number]   Referring MD:  Quay Burow  Reason for Referral:  Blurred vision episodes  HPI: 27 year Caucasian male who's had 3-4 episodes of recurrent transient blurred vision bilaterally since 12/27/2014. He states that he initially noticed uncontrolled hypertension in January this year and saw Dr. Gwenlyn Found who adjusted his medications. In early February in noticed a 20-30 minute episode of bilateral blurred vision which  was unprovoked and was a not accompanied by any headache or eye pain. He could see things but they were not clear. Did not lose vision completely. He did not have any double vision vertigo extremity weakness numbness. At times he occasionally feels dizzy and off balance for a few seconds when he gets up but this has not accompanied his blurred vision episode. Episodes are unpredictable without any obvious trigger. He has not checked his blood pressure before during or after these episodes. The last episode occurred about 3 weeks ago when he was driving he could not see this signs on the road clearly but was able to drive. Review of his medical records show that he had a CT scan of the head on 01/30/14 which are personally reviewed and was normal. He had a carotid ultrasound done last month in Dr. Kennon Holter office on 03/30/14 which also was apparently normal. He has seen an ophthalmologist and Dr. Zenia Resides office and apparently had a normal eye and funduscopic exam but I do not have those records to review. He was noted having dry eyes and given some eyedrops which seem to have helped. He denies any prior history of strokes, TIAs, seizures, loss of consciousness or significant head injury. There is no history of migraine headaches with significant headaches., Vision scintillations or scotomas. He denies any  muscle aches, pains, jaw claudication or scalp tenderness.  ROS:   14 system review of systems is positive for blurred vision, dizziness and arthritis only and all other systems negative  PMH:  Past Medical History  Diagnosis Date  . Hypertension   . History of kidney stones     x1  . Arthritis     osteoarthritis-hips/ shoulders- no issues at present  . Foley catheter in place 05/30/2013    PT HAD SURG 05/30/13 - CYSTO AND CLOT EVACUATION BLADDER / PROSTATE - UNABLE TO VOID AFTER DISCHARGE - CAME BACK TO ER SAME NIGHT AND HAD FOLEY INSERTED TO DRAINAGE BAG - URINE WAS BLOODY BUT PT REPORTS ON 06/07/13 THAT URINE IN BAG MOSTLY CLEAR NOW.    Social History:  History   Social History  . Marital Status: Married    Spouse Name: N/A  . Number of Children: 2  . Years of Education: College gr   Occupational History  . Investment advisor    Social History Main Topics  . Smoking status: Never Smoker   . Smokeless tobacco: Not on file  . Alcohol Use: Yes     Comment: social 1-2 drinks weekly  . Drug Use: No  . Sexual Activity: Yes   Other Topics Concern  . Not on file   Social History Narrative    Medications:   Current Outpatient Prescriptions on File Prior to Visit  Medication Sig Dispense Refill  . amLODipine (NORVASC) 5 MG tablet Take 2.5 mg by mouth daily.  0  . DEXILANT 60  MG capsule Take 60 mg by mouth daily.   1  . glucosamine-chondroitin 500-400 MG tablet Take 1 tablet by mouth 3 (three) times daily.    . hydrALAZINE (APRESOLINE) 50 MG tablet Take 1 tablet (50 mg total) by mouth 2 (two) times daily. 60 tablet 3  . meloxicam (MOBIC) 15 MG tablet Take 15 mg by mouth as needed.   0  . NON FORMULARY Tumeric root extract 1,094m daily    . olmesartan-hydrochlorothiazide (BENICAR HCT) 40-12.5 MG per tablet Take 1 tablet by mouth daily. 30 tablet 6  . omega-3 acid ethyl esters (LOVAZA) 1 G capsule Take 1 g by mouth daily.    . ranitidine (ZANTAC) 300 MG tablet Take 300 mg by  mouth at bedtime.    . tadalafil (CIALIS) 20 MG tablet Take 20 mg by mouth daily as needed for erectile dysfunction.    . Testosterone (ANDROGEL PUMP) 20.25 MG/ACT (1.62%) GEL Place onto the skin. Use 4 pumps daily    . traMADol (ULTRAM) 50 MG tablet Take 50 mg by mouth as needed.   0   No current facility-administered medications on file prior to visit.    Allergies:  No Known Allergies  Physical Exam General: well developed, well nourished, seated, in no evident distress Head: head normocephalic and atraumatic.   Neck: supple with no carotid or supraclavicular bruits Cardiovascular: regular rate and rhythm, no murmurs Musculoskeletal: no deformity Skin:  no rash/petichiae Vascular:  Normal pulses all extremities Filed Vitals:   04/30/14 0854  BP: 147/70  Pulse: 74    Neurologic Exam Mental Status: Awake and fully alert. Oriented to place and time. Recent and remote memory intact. Attention span, concentration and fund of knowledge appropriate. Mood and affect appropriate.  Cranial Nerves: Fundoscopic exam limited through nondilated pupils but reveals sharp disc margins. Pupils equal, briskly reactive to light. Extraocular movements full without nystagmus. Visual fields full to confrontation. Hearing intact. Facial sensation intact. Face, tongue, palate moves normally and symmetrically.  Motor: Normal bulk and tone. Normal strength in all tested extremity muscles. Sensory.: intact to touch , pinprick , position and vibratory sensation.  Coordination: Rapid alternating movements normal in all extremities. Finger-to-nose and heel-to-shin performed accurately bilaterally. Gait and Station: Arises from chair without difficulty. Stance is normal. Gait demonstrates normal stride length and balance . Able to heel, toe and tandem walk without difficulty.  Reflexes: 1+ and symmetric. Toes downgoing.   NIHSS  0 Modified Rankin  1   ASSESSMENT: 89year Caucasian male with recurrent  transient episodes of bilateral blurred vision of unclear etiology. Possibilities include temporal arteritis but absence of significant headaches or other accompanying symptoms make this less likely. Vertebrobasilar insufficiency or small vessel retinal ischemia from hypertension, ocular migraine sans headache are other possibilities which need evaluation.    PLAN: I had a long discussion with the patient with regards to his episodes of transient recurrent bilateral blurred vision and discuss my clinical impression, personally reviewed her available imaging studies and referral notes and answered questions. Possibilities include temporal arteritis without a headache versus vertebrobasilar   or small vessel retinal ischemia. Or ocular migraine sans headache Recommend he start taking aspirin 81 mg daily and check ESR, ANA panel and MRI scan of the brain with MRA of the brain and neck. May consider temporal artery biopsy at follow-up visit with his about tests are needing. Start taking enteric-coated aspirin 81 mg daily. Maintain strict control of hypertension with blood pressure goal below 140/90. We will  also obtain records from his recent ophthalmological consultation .Return for follow-up in 4 weeks or call earlier if necessary.   Note: This document was prepared with digital dictation and possible smart phrase technology. Any transcriptional errors that result from this process are unintentional.   

## 2014-05-01 LAB — COMPLEMENT, TOTAL: Compl, Total (CH50): 46 U/mL (ref 42–60)

## 2014-05-01 LAB — ANA: Anti Nuclear Antibody(ANA): NEGATIVE

## 2014-05-01 LAB — SEDIMENTATION RATE: Sed Rate: 10 mm/hr (ref 0–30)

## 2014-05-01 LAB — C3 AND C4
Complement C3, Serum: 130 mg/dL (ref 82–167)
Complement C4, Serum: 26 mg/dL (ref 14–44)

## 2014-05-03 ENCOUNTER — Ambulatory Visit (INDEPENDENT_AMBULATORY_CARE_PROVIDER_SITE_OTHER): Payer: 59

## 2014-05-03 DIAGNOSIS — H53133 Sudden visual loss, bilateral: Secondary | ICD-10-CM

## 2014-05-03 MED ORDER — GADOPENTETATE DIMEGLUMINE 469.01 MG/ML IV SOLN: 20.0000 mL | Freq: Once | INTRAVENOUS | Status: AC | PRN

## 2014-05-04 ENCOUNTER — Telehealth: Payer: Self-pay | Admitting: Neurology

## 2014-05-14 DIAGNOSIS — R52 Pain, unspecified: Secondary | ICD-10-CM

## 2014-05-28 ENCOUNTER — Other Ambulatory Visit: Payer: Self-pay | Admitting: Cardiovascular Disease

## 2014-05-28 NOTE — Telephone Encounter (Signed)
Rx(s) sent to pharmacy electronically.  

## 2014-05-31 ENCOUNTER — Ambulatory Visit (INDEPENDENT_AMBULATORY_CARE_PROVIDER_SITE_OTHER): Payer: 59 | Admitting: Nurse Practitioner

## 2014-05-31 ENCOUNTER — Encounter: Payer: Self-pay | Admitting: Nurse Practitioner

## 2014-05-31 VITALS — BP 133/70 | HR 72 | Ht 69.0 in | Wt 210.2 lb

## 2014-05-31 DIAGNOSIS — I10 Essential (primary) hypertension: Secondary | ICD-10-CM

## 2014-05-31 DIAGNOSIS — H538 Other visual disturbances: Secondary | ICD-10-CM

## 2014-05-31 NOTE — Progress Notes (Signed)
[facility]  PATIENT: BHARATH BERNSTEIN DOB: 14-Jun-1945   REASON FOR VISIT: Follow-up for blurred vision  HISTORY FROM: patient     HISTORY OF PRESENT ILLNESS:69 year Caucasian male who's had 3-4 episodes of recurrent transient blurred vision bilaterally since 12/27/2014. He states that he initially noticed uncontrolled hypertension in January this year and saw Dr. Allyson Sabal who adjusted his medications. In early February in noticed a 20-30 minute episode of bilateral blurred vision which was unprovoked and was a not accompanied by any headache or eye pain. He could see things but they were not clear. Did not lose vision completely. He did not have any double vision vertigo extremity weakness numbness. At times he occasionally feels dizzy and off balance for a few seconds when he gets up but this has not accompanied his blurred vision episode. Episodes are unpredictable without any obvious trigger. He has not checked his blood pressure before during or after these episodes. The last episode occurred about 3 weeks ago when he was driving he could not see this signs on the road clearly but was able to drive. Review of his medical records show that he had a CT scan of the head on 01/30/14 which are personally reviewed and was normal. He had a carotid ultrasound done last month in Dr. Hazle Coca office on 03/30/14 which also was apparently normal. He has seen an ophthalmologist and Dr. Laruth Bouchard office and apparently had a normal eye and funduscopic exam but I do not have those records to review. He was noted having dry eyes and given some eyedrops which seem to have helped. He denies any prior history of strokes, TIAs, seizures, loss of consciousness or significant head injury. There is no history of migraine headaches with significant headaches., Vision scintillations or scotomas. He denies any muscle aches, pains, jaw claudication or scalp tenderness.  UPDATE 05/31/14 Mr.Rawles, 69 year old  male returns for follow-up. He was initially evaluated for blurred vision recurrent episodes by Dr. Pearlean Brownie 04/30/2014. He had normal MRI of the brain with and without contrast. 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 Focal area signal loss involving terminal right internal carotid artery may represent moderate stenosis but this is not flow-limiting. Unremarkable MRA of the neck with and without contrast. He is currently on aspirin 0.81 mg daily. He is also seen ophthalmology who diagnosed him with dry eyes and he is now on eyedrops. He has not had further symptoms of blurred vision He returns today for follow-up. No other neurologic complaints  REVIEW OF SYSTEMS: Full 14 system review of systems performed and notable only for those listed, all others are neg:  Constitutional: neg  Cardiovascular: neg Ear/Nose/Throat: neg  Skin: neg Eyes: neg Respiratory: neg Gastroitestinal: neg  Hematology/Lymphatic: neg  Endocrine: neg Musculoskeletal:neg Allergy/Immunology: neg Neurological: neg Psychiatric: neg Sleep : neg   ALLERGIES: No Known Allergies  HOME MEDICATIONS: Outpatient Prescriptions Prior to Visit  Medication Sig Dispense Refill  . amLODipine (NORVASC) 5 MG tablet Take 0.5 tablets (2.5 mg total) by mouth daily. 30 tablet 5  . aspirin 81 MG EC tablet Take 1 tablet (81 mg total) by mouth daily. Swallow whole. 30 tablet 12  . BEPREVE 1.5 % SOLN 1 drop. 1 gtt to both eyes bid.  12  . DEXILANT 60 MG capsule Take 60 mg by mouth daily.   1  . glucosamine-chondroitin 500-400 MG tablet Take  1 tablet by mouth 3 (three) times daily.    . hydrALAZINE (APRESOLINE) 50 MG tablet Take 1 tablet (50 mg total) by mouth 2 (two) times daily. 60 tablet 3  . meloxicam (MOBIC) 15 MG tablet Take 15 mg by mouth as needed.   0  . NON FORMULARY Tumeric  root extract 1,050mg  daily    . olmesartan-hydrochlorothiazide (BENICAR HCT) 40-12.5 MG per tablet Take 1 tablet by mouth daily. 30 tablet 6  . omega-3 acid ethyl esters (LOVAZA) 1 G capsule Take 1 g by mouth daily.    . ranitidine (ZANTAC) 300 MG tablet Take 300 mg by mouth at bedtime.    . tadalafil (CIALIS) 20 MG tablet Take 20 mg by mouth daily as needed for erectile dysfunction.    . Testosterone (ANDROGEL PUMP) 20.25 MG/ACT (1.62%) GEL Place onto the skin. Use 4 pumps daily    . traMADol (ULTRAM) 50 MG tablet Take 50 mg by mouth as needed.   0   No facility-administered medications prior to visit.    PAST MEDICAL HISTORY: Past Medical History  Diagnosis Date  . Hypertension   . History of kidney stones     x1  . Arthritis     osteoarthritis-hips/ shoulders- no issues at present  . Foley catheter in place 05/30/2013    PT HAD SURG 05/30/13 - CYSTO AND CLOT EVACUATION BLADDER / PROSTATE - UNABLE TO VOID AFTER DISCHARGE - CAME BACK TO ER SAME NIGHT AND HAD FOLEY INSERTED TO DRAINAGE BAG - URINE WAS BLOODY BUT PT REPORTS ON 06/07/13 THAT URINE IN BAG MOSTLY CLEAR NOW.    PAST SURGICAL HISTORY: Past Surgical History  Procedure Laterality Date  . Joint replacement      '00-left/'08 RTHA  . Back surgery      lumbar   . Shoulder arthroscopy Right   . Shoulder arthrotomy Left   . Hernia repair      inguinal  . Prostate surgery Bilateral     7- 8 yrs ago  . Cystoscopy w/ ureteroscopy w/ lithotripsy    . Cystoscopy/retrograde/ureteroscopy/stone extraction with basket N/A 05/30/2013    Procedure: CYSTOSCOPY , BLADDER BIOPSY AND CLOT EVACUATION , CAUTERIZATIO OF LATERAL RIGHT AND LEFT LOBE OF PROSTATE;  Surgeon: Kathi LudwigSigmund I Tannenbaum, MD;  Location: [facility];  Service: Urology;  Laterality: N/A;  . Transurethral resection of prostate N/A 06/09/2013    Procedure: TRANSURETHRAL RESECTION OF THE PROSTATE (TURP) WITH GYRUS;  Surgeon: Kathi LudwigSigmund I Tannenbaum, MD;  Location: [facility];  Service:  Urology;  Laterality: N/A;    FAMILY HISTORY: History reviewed. No pertinent family history.  SOCIAL HISTORY: History   Social History  . Marital Status: Married    Spouse Name: N/A  . Number of Children: 2  . Years of Education: College gr   Occupational History  . Investment advisor    Social History Main Topics  . Smoking status: Never Smoker   . Smokeless tobacco: Not on file  . Alcohol Use: Yes     Comment: social 1-2 drinks weekly  . Drug Use: No  . Sexual Activity: Yes   Other Topics Concern  . Not on file   Social History Narrative     PHYSICAL EXAM  Filed Vitals:   05/31/14 0830  BP: 133/70  Pulse: 72  Height: 5\' 9"  (1.753 m)  Weight: 210 lb 3.2 oz (95.346 kg)   Body mass index is 31.03 kg/(m^2).  Generalized: Well developed, mildly obese male in no acute distress  Head: normocephalic and atraumatic,. Oropharynx benign  Neck: Supple, no carotid bruits  Cardiac: Regular rate rhythm, no murmur or rub Musculoskeletal: No deformity  Skin no rash or petechiae  Neurological examination  Awake and fully alert. Oriented to place and time. Recent and remote memory intact. Attention span, concentration and fund of knowledge appropriate. Mood and affect appropriate.  Cranial Nerves: Fundoscopic exam  reveals sharp disc margins. Pupils equal, briskly reactive to light. Extraocular movements full without nystagmus. Visual fields full to confrontation. Hearing intact. Facial sensation intact. Face, tongue, palate moves normally and symmetrically.  Motor: Normal bulk and tone. Normal strength in all tested extremity muscles. No focal weakness Sensory.: intact to touch , pinprick , position and vibratory sensation.  Coordination: Rapid alternating movements normal in all extremities. Finger-to-nose and heel-to-shin performed accurately bilaterally. Gait and Station: Arises from chair without difficulty. Stance is normal. Gait demonstrates normal stride length and  balance . Able to heel, toe and tandem walk without difficulty.  Reflexes: 1+ and symmetric. Toes downgoing.   DIAGNOSTIC DATA (LABS, IMAGING, TESTING) - I reviewed patient records, labs, notes, testing and imaging myself where available.  Lab Results  Component Value Date   WBC 6.8 05/29/2013   HGB 17.3* 01/30/2014   HCT 51.0 01/30/2014   MCV 86.5 05/29/2013   PLT 126* 05/29/2013      Component Value Date/Time   NA 137 01/30/2014 1326   K 5.5* 01/30/2014 1326   CL 103 01/30/2014 1326   CO2 29 05/29/2013 1210   GLUCOSE 101* 01/30/2014 1326   BUN 38* 01/30/2014 1326   CREATININE 0.90 01/30/2014 1326   CALCIUM 9.5 05/29/2013 1210   GFRNONAA 86* 05/29/2013 1210   GFRAA >90 05/29/2013 1210    ASSESSMENT AND PLAN  69 y.o. year old male  has a past medical history of Hypertension; and transient episodes of bilateral blurred vision.Normal MRI of the brain with and without contrast. 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 Focal area signal loss involving terminal right internal carotid artery may represent moderate stenosis but this is not flow-limiting. Unremarkable MRA of the neck with and without contrast.  Continue ASA .81mg  daily Given copies of MRI and MRA Encourage regular exercise for overall health and well-being Systolic blood pressure less than 130, today's reading 133/70 F/U prn as needed Nilda RiggsNancy Carolyn Odester Nilson, [facility] ComplexGNP, [facility] HospitalBC, APRN  [facility] [address], [address] [place], [place] [address] [phone]

## 2014-05-31 NOTE — Progress Notes (Signed)
I agree with the above plan 

## 2014-05-31 NOTE — Patient Instructions (Signed)
Continue ASA .81mg  daily Given copies of MRI and MRA F/U prn as needed

## 2014-06-16 ENCOUNTER — Other Ambulatory Visit: Payer: Self-pay | Admitting: Cardiovascular Disease

## 2014-06-19 NOTE — Telephone Encounter (Signed)
norvasc  Filled 5.9.2016

## 2014-06-20 ENCOUNTER — Telehealth: Payer: Self-pay | Admitting: Pharmacist Clinician (PhC)/ Clinical Pharmacy Specialist

## 2014-06-20 MED ORDER — AMLODIPINE BESYLATE 5 MG PO TABS: 5.0000 mg | ORAL_TABLET | Freq: Every day | ORAL | Status: DC

## 2014-06-20 NOTE — Telephone Encounter (Signed)
Pt called, confused about whether he was taking meds correctly.  Reviewed HTN meds with patient, has been taking Benicar HCT 40/12.5 qam, hydralazine 50 mg bid and amlodipine 5 mg qhs.  Pt states he believes he was told to stop amlodipine, just ran out and wasn't sure if he should refill.  Reviewed home BP readings.  Since March 9 has had avg of 142/66, with range from 130-155 systolic.  States feeling fine.  Advised that these are good readings, we should not stop any medications at this time, but continue with the amlodipine.  Sent rx to [facility].  Pt voiced understanding.

## 2014-06-21 ENCOUNTER — Other Ambulatory Visit: Payer: Self-pay | Admitting: Cardiovascular Disease

## 2014-06-21 NOTE — Telephone Encounter (Signed)
Rx(s) sent to pharmacy electronically.  

## 2014-08-06 ENCOUNTER — Other Ambulatory Visit: Payer: Self-pay | Admitting: Pharmacist Clinician (PhC)/ Clinical Pharmacy Specialist

## 2014-09-08 ENCOUNTER — Other Ambulatory Visit: Payer: Self-pay | Admitting: Cardiovascular Disease

## 2014-09-10 NOTE — Telephone Encounter (Signed)
Rx(s) sent to pharmacy electronically.  

## 2014-09-12 ENCOUNTER — Other Ambulatory Visit: Payer: Self-pay | Admitting: Cardiovascular Disease

## 2014-09-12 NOTE — Telephone Encounter (Signed)
Rx request sent to pharmacy.  

## 2014-09-26 ENCOUNTER — Ambulatory Visit (INDEPENDENT_AMBULATORY_CARE_PROVIDER_SITE_OTHER): Payer: 59 | Admitting: Cardiovascular Disease

## 2014-09-26 ENCOUNTER — Encounter: Payer: Self-pay | Admitting: Cardiovascular Disease

## 2014-09-26 VITALS — BP 134/58 | HR 72 | Ht 69.0 in | Wt 206.3 lb

## 2014-09-26 DIAGNOSIS — I10 Essential (primary) hypertension: Secondary | ICD-10-CM | POA: Diagnosis not present

## 2014-09-26 MED ORDER — HYDRALAZINE HCL 50 MG PO TABS: 50.0000 mg | ORAL_TABLET | Freq: Two times a day (BID) | ORAL | Status: DC

## 2014-09-26 MED ORDER — OLMESARTAN MEDOXOMIL-HCTZ 40-12.5 MG PO TABS: 1.0000 | ORAL_TABLET | Freq: Every day | ORAL | Status: DC

## 2014-09-26 MED ORDER — AMLODIPINE BESYLATE 5 MG PO TABS: 5.0000 mg | ORAL_TABLET | Freq: Every day | ORAL | Status: DC

## 2014-09-26 NOTE — Patient Instructions (Signed)
Your physician wants you to follow-up in: 1 year with Dr Berry. You will receive a reminder letter in the mail two months in advance. If you don't receive a letter, please call our office to schedule the follow-up appointment.  

## 2014-09-26 NOTE — Assessment & Plan Note (Signed)
History of hypertension blood pressure pressure under much better control on Benicar, amlodipine and hydralazine. His blood pressure today is 134/58. We have reviewed his blood pressure log over the last several weeks to months they've been remarkably consistently well controlled. He feels clinically improved. Continue current meds at current dosing

## 2014-09-26 NOTE — Progress Notes (Signed)
09/26/2014 Kevin [name]   1945-06-14  [id number]  Primary Physician Gaspar Garbe, MD Primary Cardiologist: Runell Gess MD Roseanne Reno   HPI:  Kevin [name] is a 69 year old mildly overweight married Caucasian male who works at [employer]. I last saw him in the office 03/27/14.He has a history of hypertension on ACE inhibitor as in the past. His history otherwise is remarkable for multiple joint replacements as well as prostate resection because of prostate surgery, with ureteral complications. His ACE inhibitor was recently changed to an ARB (losartan) because of a hoarse voice. He probably needs vocal cord surgery next week. He was seen at [facility] today for preoperative evaluation with blood pressure was measured multiple times in the 190/90 range. Since I saw him in the office we have been titrating his blood pressure medicines to the point now that his blood pressure is much better controlled in the 130/6070 range. He currently is on hydralazine, Benicar andamlodipine. He denies chest pain or shortness of breath. He did have some vague ocular phenomenon for which he saw his ophthalmologist. 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 I referred him to Dr. Pearlean Brownie for neurologic evaluation. An MRI/MRA was unremarkable. His blood pressure has been under much better control in the last several months.   Current Outpatient Prescriptions  Medication Sig Dispense Refill  . amLODipine (NORVASC) 5 MG tablet Take 1 tablet (5 mg total) by mouth daily. 90 tablet 3  . aspirin 81 MG EC tablet Take 1 tablet (81 mg total) by mouth daily. Swallow whole. 30 tablet 12  . DEXILANT 60 MG capsule Take 60 mg by mouth daily.   1  . glucosamine-chondroitin 500-400 MG tablet Take 1 tablet by mouth 3 (three) times daily.    . hydrALAZINE (APRESOLINE) 50 MG tablet Take 1 tablet (50 mg total) by mouth 2 (two) times daily. 180 tablet 3  . meloxicam (MOBIC) 15 MG tablet Take 15 mg by  mouth as needed.   0  . NON FORMULARY Tumeric root extract 1,050mg  daily    . olmesartan-hydrochlorothiazide (BENICAR HCT) 40-12.5 MG per tablet Take 1 tablet by mouth daily. 90 tablet 3  . omega-3 acid ethyl esters (LOVAZA) 1 G capsule Take 1 g by mouth daily.    . ranitidine (ZANTAC) 300 MG tablet Take 300 mg by mouth at bedtime.    . tadalafil (CIALIS) 20 MG tablet Take 20 mg by mouth daily as needed for erectile dysfunction.    . Testosterone (ANDROGEL PUMP) 20.25 MG/ACT (1.62%) GEL Place onto the skin. Use 4 pumps daily    . traMADol (ULTRAM) 50 MG tablet Take 50 mg by mouth as needed.   0   No current facility-administered medications for this visit.    No Known Allergies  Social History   Social History  . Marital Status: Married    Spouse Name: N/A  . Number of Children: 2  . Years of Education: College gr   Occupational History  . Investment advisor    Social History Main Topics  . Smoking status: Never Smoker   . Smokeless tobacco: Not on file  . Alcohol Use: Yes     Comment: social 1-2 drinks weekly  . Drug Use: No  . Sexual Activity: Yes   Other Topics Concern  . Not on file   Social History Narrative     Review of Systems: General: negative for chills, fever, night sweats or weight changes.  Cardiovascular: negative  for chest pain, dyspnea on exertion, edema, orthopnea, palpitations, paroxysmal nocturnal dyspnea or shortness of breath Dermatological: negative for rash Respiratory: negative for cough or wheezing Urologic: negative for hematuria Abdominal: negative for nausea, vomiting, diarrhea, bright red blood per rectum, melena, or hematemesis Neurologic: negative for visual changes, syncope, or dizziness All other systems reviewed and are otherwise negative except as noted above.    Blood pressure 134/58, pulse 72, height  (1.753 m), weight 206 lb 4.8 oz (93.577 kg).  General appearance: alert and no distress Neck: no adenopathy, no carotid  bruit, no JVD, supple, symmetrical, trachea midline and thyroid not enlarged, symmetric, no tenderness/mass/nodules Lungs: clear to auscultation bilaterally Heart: regular rate and rhythm, S1, S2 normal, no murmur, click, rub or gallop Extremities: extremities normal, atraumatic, no cyanosis or edema  EKG not performed today  ASSESSMENT AND PLAN:   Essential hypertension History of hypertension blood pressure pressure under much better control on Benicar, amlodipine and hydralazine. His blood pressure today is 134/58. We have reviewed his blood pressure log over the last several weeks to months they've been remarkably consistently well controlled. He feels clinically improved. Continue current meds at current dosing      Runell Gess MD [facility], [facility] 09/26/2014 9:46 AM

## 2015-02-28 ENCOUNTER — Encounter: Payer: Self-pay | Admitting: Obstetrics & Gynecology

## 2015-05-16 ENCOUNTER — Other Ambulatory Visit: Payer: Self-pay | Admitting: Cardiovascular Disease

## 2015-05-16 ENCOUNTER — Other Ambulatory Visit: Payer: Self-pay | Admitting: *Deleted

## 2015-05-16 MED ORDER — HYDRALAZINE HCL 50 MG PO TABS: 50.0000 mg | ORAL_TABLET | Freq: Two times a day (BID) | ORAL | Status: DC

## 2015-05-16 MED ORDER — AMLODIPINE BESYLATE 5 MG PO TABS: 5.0000 mg | ORAL_TABLET | Freq: Every day | ORAL | Status: DC

## 2015-05-16 MED ORDER — OLMESARTAN MEDOXOMIL-HCTZ 40-12.5 MG PO TABS: 1.0000 | ORAL_TABLET | Freq: Every day | ORAL | Status: DC

## 2015-05-21 ENCOUNTER — Other Ambulatory Visit: Payer: Self-pay | Admitting: *Deleted

## 2015-05-27 IMAGING — CR DG CHEST 2V
2 series · 2 of 2 positions shown · non-contrast
Comparison: None available.

CLINICAL DATA: Preoperative chest radiograph. Bladder tumor
removal.

EXAM:
CHEST  2 VIEW

[w chest pa]
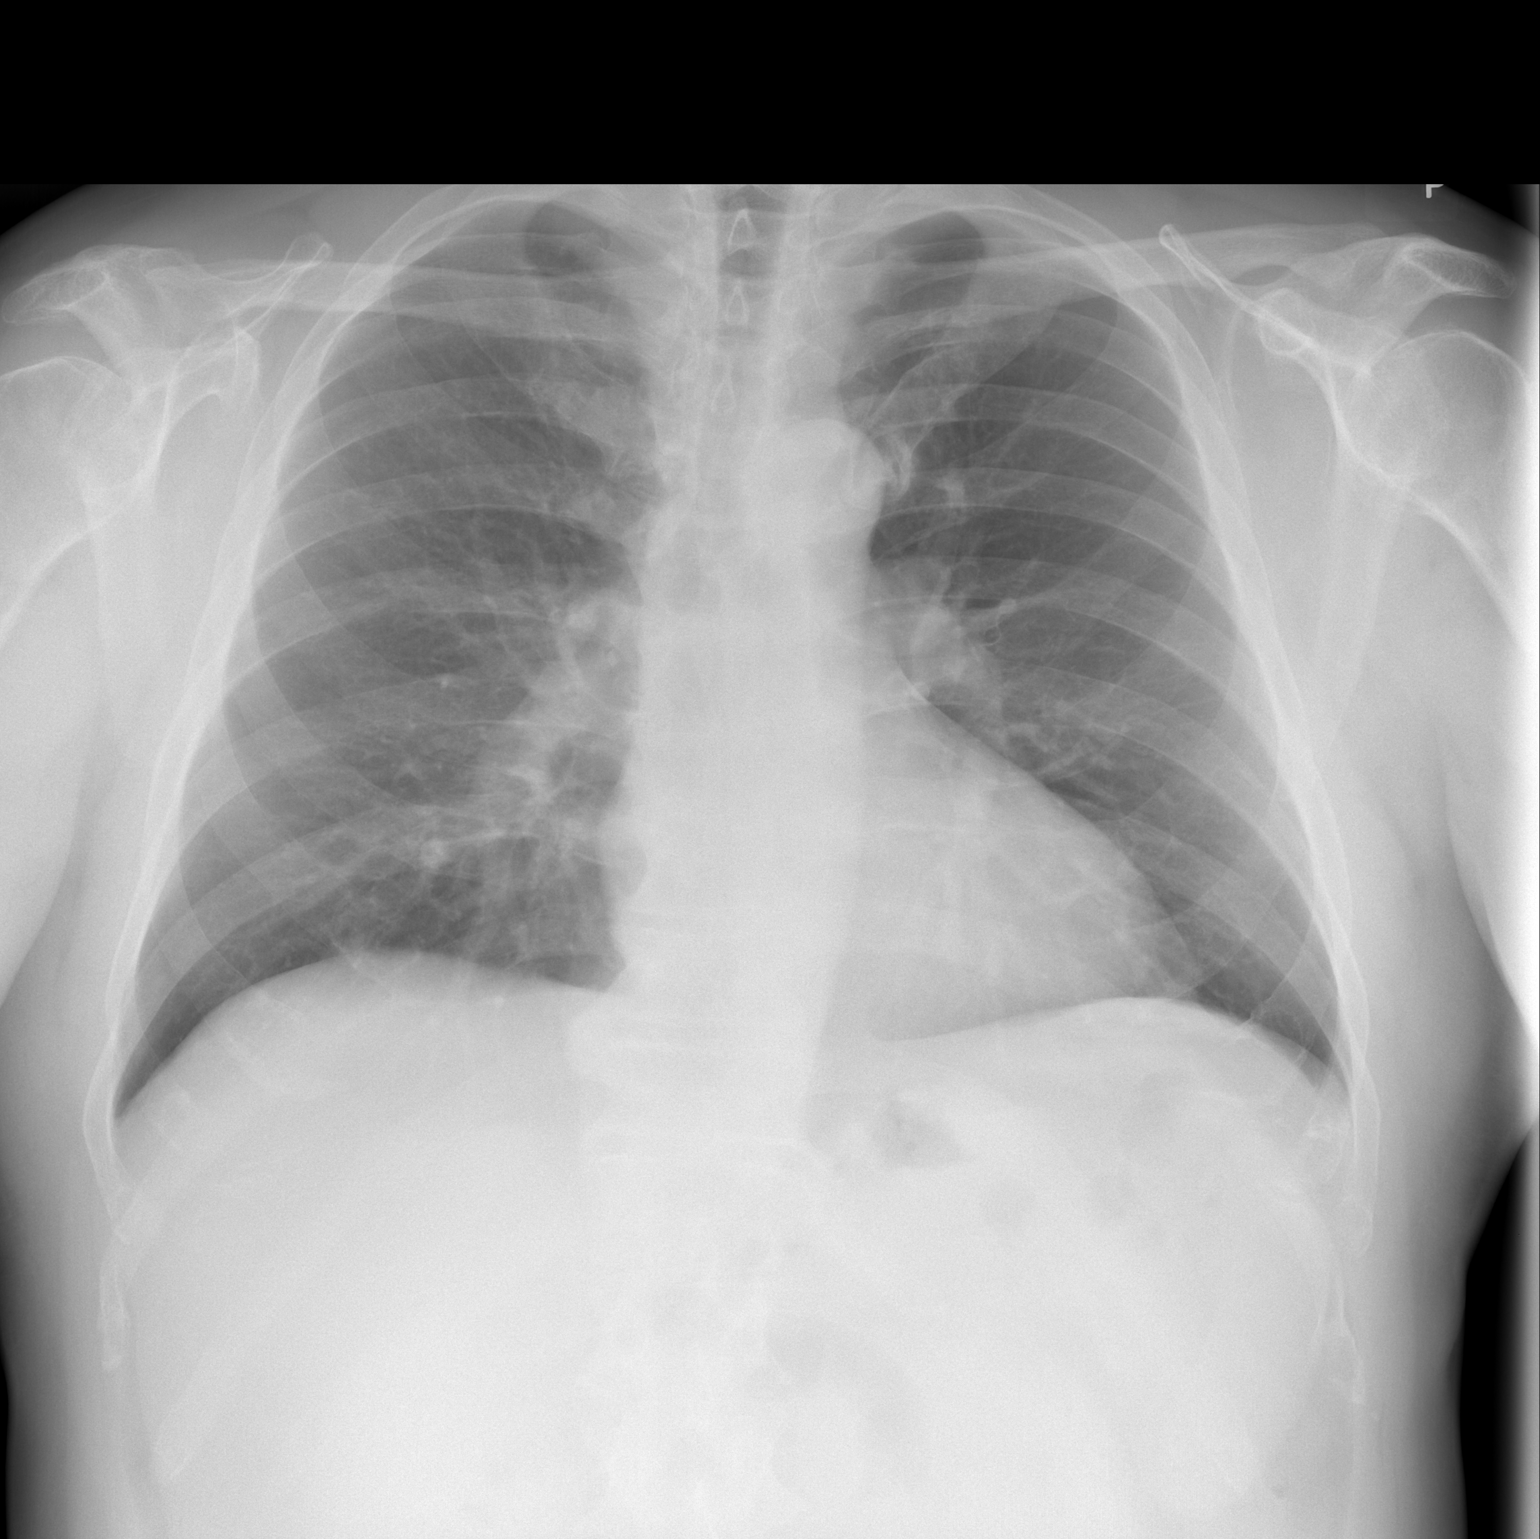

[w chest lat]
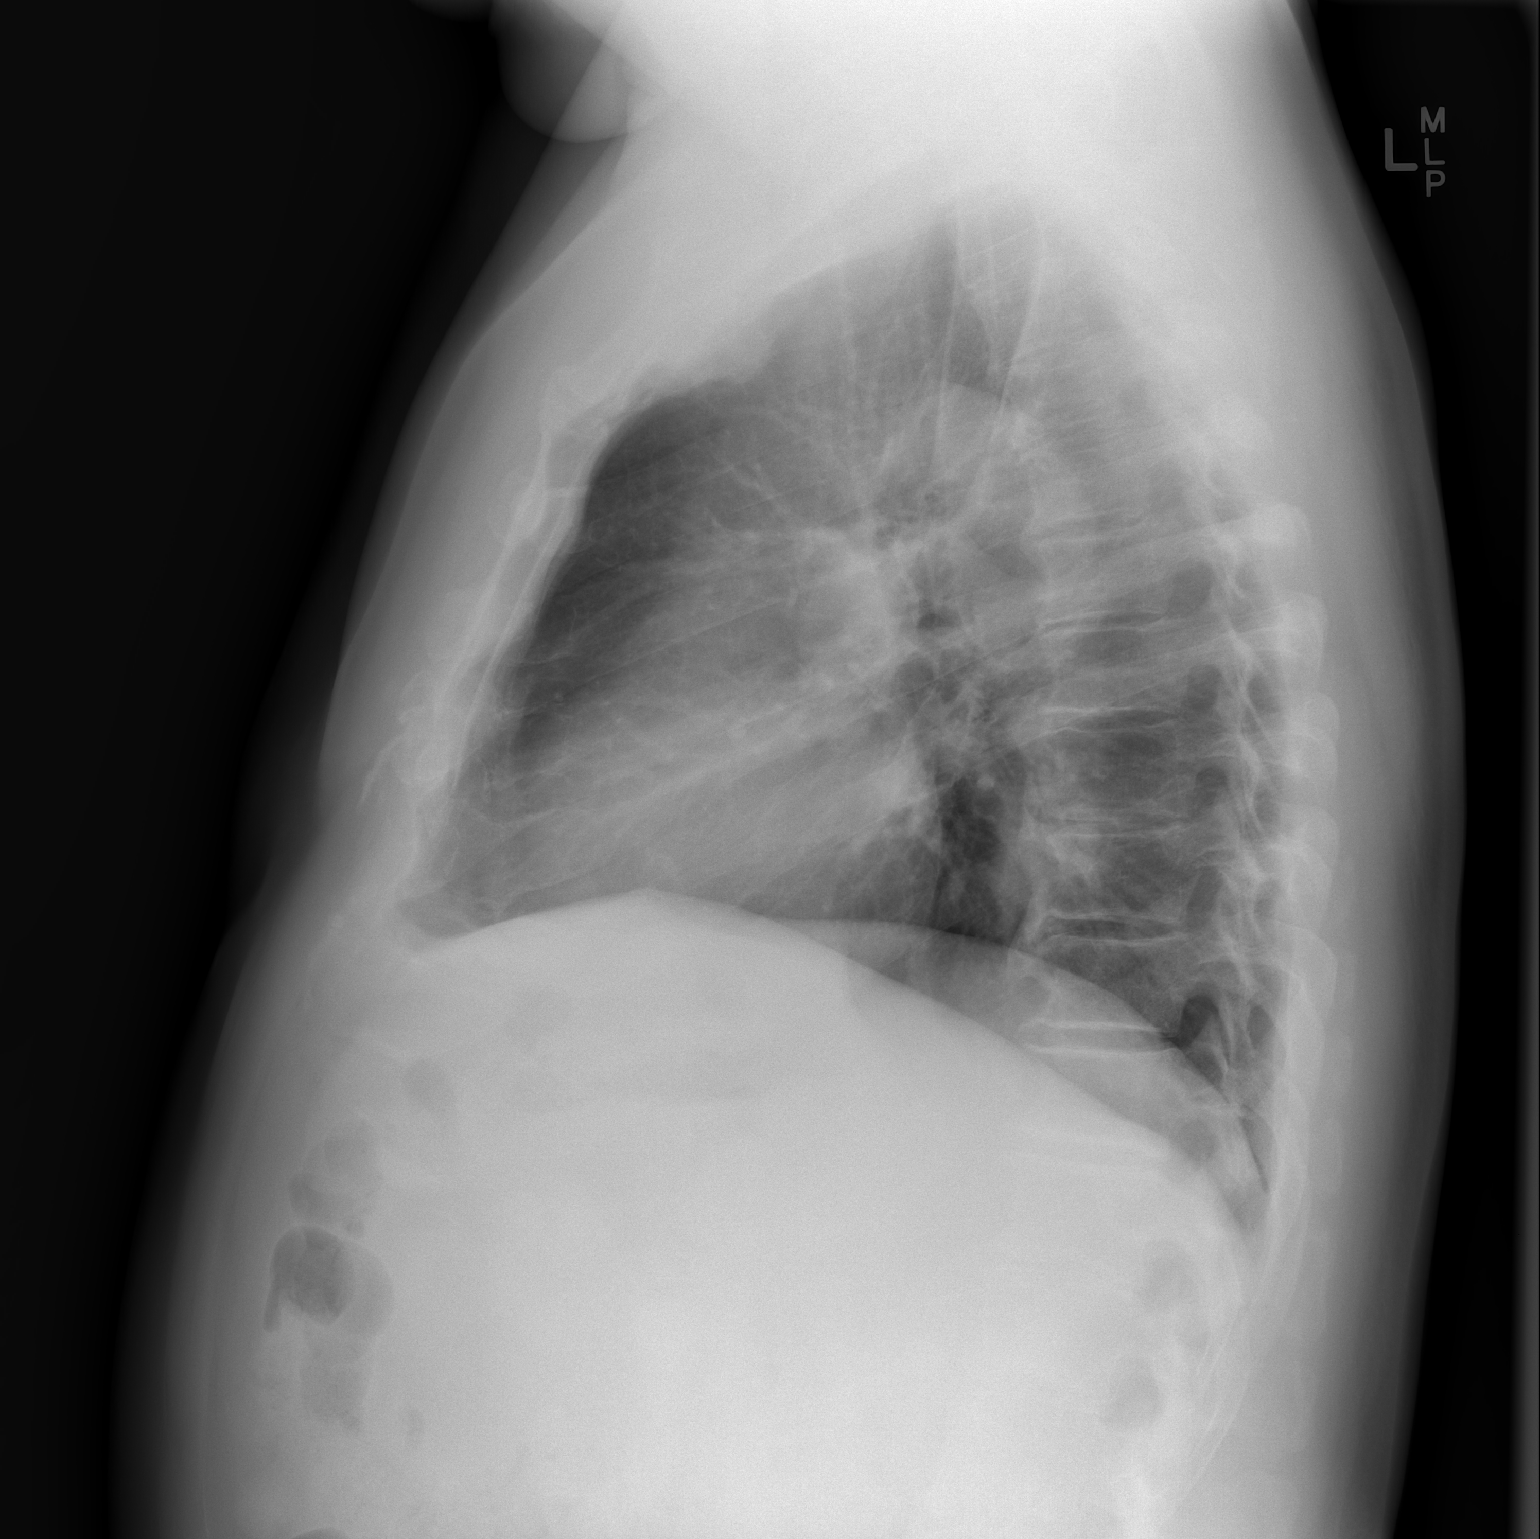

[2 of 2 positions shown; findings below may reference images not displayed]

FINDINGS: Prominent vessels are present in the left suprahilar region. There
is no airspace disease or pleural effusion. Cardiopericardial
silhouette is within normal limits.
IMPRESSION: No active cardiopulmonary disease.

## 2015-10-14 ENCOUNTER — Other Ambulatory Visit: Payer: Self-pay | Admitting: Cardiovascular Disease

## 2015-10-14 NOTE — Telephone Encounter (Signed)
Rx request sent to pharmacy.  

## 2015-10-16 ENCOUNTER — Telehealth: Payer: Self-pay | Admitting: *Deleted

## 2015-10-16 NOTE — Telephone Encounter (Signed)
Okay to hold aspirin for his right knee arthroscopy

## 2015-10-16 NOTE — Telephone Encounter (Signed)
Patient cleared for surgery. Patient may hold aspirin for 5-7 days prior to surgery per pharmacy protocol. Encounter routed to [facility] via [facility].

## 2015-10-16 NOTE — Telephone Encounter (Signed)
Requesting surgical clearance:   1. Type of surgery: Right knee arthroscopy  2. Surgeon: Dr Salvatore Marvelobert [facility]  3. Surgical date: TBD  4. Medications that need to be held: aspirin  5. CAD: No     6. I will defer to: Dr Lasandra BeechBerry   [facility] Fax-[phone] Phone- [phone]

## 2015-12-11 ENCOUNTER — Telehealth: Payer: Self-pay | Admitting: Pharmacist Clinician (PhC)/ Clinical Pharmacy Specialist

## 2015-12-11 MED ORDER — OLMESARTAN MEDOXOMIL-HCTZ 40-12.5 MG PO TABS: 1.0000 | ORAL_TABLET | Freq: Every day | ORAL | 1 refills | Status: DC

## 2015-12-11 NOTE — Telephone Encounter (Signed)
Patient out of Benicar hct, mail order not expected to arrive for 10-14 days.  [facility]

## 2015-12-17 ENCOUNTER — Other Ambulatory Visit: Payer: Self-pay

## 2015-12-17 MED ORDER — OLMESARTAN MEDOXOMIL-HCTZ 40-12.5 MG PO TABS: 1.0000 | ORAL_TABLET | Freq: Every day | ORAL | 0 refills | Status: DC

## 2015-12-17 MED ORDER — HYDRALAZINE HCL 50 MG PO TABS: 50.0000 mg | ORAL_TABLET | Freq: Two times a day (BID) | ORAL | 0 refills | Status: DC

## 2015-12-17 MED ORDER — AMLODIPINE BESYLATE 5 MG PO TABS: 5.0000 mg | ORAL_TABLET | Freq: Every day | ORAL | 0 refills | Status: DC

## 2015-12-24 ENCOUNTER — Ambulatory Visit (INDEPENDENT_AMBULATORY_CARE_PROVIDER_SITE_OTHER): Payer: 59 | Admitting: Cardiovascular Disease

## 2015-12-24 ENCOUNTER — Encounter: Payer: Self-pay | Admitting: Cardiovascular Disease

## 2015-12-24 VITALS — BP 158/74 | HR 69 | Ht 69.0 in | Wt 213.0 lb

## 2015-12-24 DIAGNOSIS — I10 Essential (primary) hypertension: Secondary | ICD-10-CM | POA: Diagnosis not present

## 2015-12-24 NOTE — Patient Instructions (Signed)

## 2015-12-24 NOTE — Assessment & Plan Note (Signed)
History of hypertension with blood pressure measured at 158/74. He is on amlodipine, hydralazine, Benicar and hydrochlorothiazide. I reviewed his home blood pressure measurements and they seem to be within normal range. Continue current meds at current dosing

## 2015-12-24 NOTE — Progress Notes (Signed)
12/24/2015 Kevin ComberRobert E [name]   08/19/1945  [id number]  Primary Physician Kevin [name] Primary Cardiologist: Kevin GessJonathan J Gardner Servantes [name] Kevin [name], FACC, FAHA, FSCAI  HPI:  Mr. Kevin [name] is a 70 year old mildly overweight married Caucasian male who works at [employer]. I last saw him in the office 03/27/14.He has a history of hypertension on ACE inhibitor as in the past. I last saw him in the office 09/26/14. His history otherwise is remarkable for multiple joint replacements as well as prostate resection because of prostate surgery, with ureteral complications. His ACE inhibitor was recently changed to an ARB (losartan) because of a hoarse voice. He probably needs vocal cord surgery next week. [facility] with blood pressure was measured multiple times in the 190/90 range. Since I saw him in the office we have been titrating his blood pressure medicines to the point now that his blood pressure is much better controlled in the 130/6070 range. He currently is on hydralazine, Benicar andamlodipine. He denies chest pain or shortness of breath. He did have some vague ocular phenomenon for which he saw his ophthalmologist. 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 I referred him to Kevin [name] for neurologic evaluation. An MRI/MRA was unremarkable. His blood pressure has been under much better control on his current medical regimen. He follows his blood pressure as an outpatient at home.    Current Outpatient Prescriptions  Medication Sig Dispense Refill  . amLODipine (NORVASC) 5 MG tablet Take 1 tablet (5 mg total) by mouth daily. 90 tablet 0  . aspirin 81 MG EC tablet Take 1 tablet (81 mg total) by mouth daily. Swallow whole. 30 tablet 12  . DEXILANT 60 MG capsule Take 60 mg by mouth daily.   1  . glucosamine-chondroitin 500-400 MG tablet Take 1 tablet by mouth 3 (three) times daily.    . hydrALAZINE (APRESOLINE) 50 MG tablet Take 1 tablet (50  mg total) by mouth 2 (two) times daily. 180 tablet 0  . meloxicam (MOBIC) 15 MG tablet Take 15 mg by mouth as needed.   0  . NON FORMULARY Tumeric root extract 1,050mg  daily    . olmesartan-hydrochlorothiazide (BENICAR HCT) 40-12.5 MG tablet Take 1 tablet by mouth daily. 90 tablet 0  . omega-3 acid ethyl esters (LOVAZA) 1 G capsule Take 1 g by mouth daily.    . ranitidine (ZANTAC) 300 MG tablet Take 300 mg by mouth at bedtime.    . tadalafil (CIALIS) 20 MG tablet Take 20 mg by mouth daily as needed for erectile dysfunction.    . Testosterone (ANDROGEL PUMP) 20.25 MG/ACT (1.62%) GEL Place onto the skin. Use 4 pumps daily    . traMADol (ULTRAM) 50 MG tablet Take 50 mg by mouth as needed.   0   No current facility-administered medications for this visit.     No Known Allergies  Social History   Social History  . Marital status: Married    Spouse name: N/A  . Number of children: 2  . Years of education: College gr   Occupational History  . Investment advisor    Social History Main Topics  . Smoking status: Never Smoker  . Smokeless tobacco: Never Used  . Alcohol use Yes     Comment: social 1-2 drinks weekly  . Drug use: No  . Sexual activity: Yes   Other Topics Concern  . Not on file   Social History Narrative  .  No narrative on file     Review of Systems: General: negative for chills, fever, night sweats or weight changes.  Cardiovascular: negative for chest pain, dyspnea on exertion, edema, orthopnea, palpitations, paroxysmal nocturnal dyspnea or shortness of breath Dermatological: negative for rash Respiratory: negative for cough or wheezing Urologic: negative for hematuria Abdominal: negative for nausea, vomiting, diarrhea, bright red blood per rectum, melena, or hematemesis Neurologic: negative for visual changes, syncope, or dizziness All other systems reviewed and are otherwise negative except as noted above.    Blood pressure (!) 158/74, pulse 69, height 5\' 9"   (1.753 m), weight 213 lb (96.6 kg).  General appearance: alert and no distress Neck: no adenopathy, no carotid bruit, no JVD, supple, symmetrical, trachea midline and thyroid not enlarged, symmetric, no tenderness/mass/nodules Lungs: clear to auscultation bilaterally Heart: regular rate and rhythm, S1, S2 normal, no murmur, click, rub or gallop Extremities: extremities normal, atraumatic, no cyanosis or edema  EKG sinus rhythm at 69 with left axis deviation. I personally reviewed this EKG  ASSESSMENT AND PLAN:   Essential hypertension History of hypertension with blood pressure measured at 158/74. He is on amlodipine, hydralazine, Benicar and hydrochlorothiazide. I reviewed his home blood pressure measurements and they seem to be within normal range. Continue current meds at current dosing      Kevin GessJonathan J. Kevin Battin [name] [facility] North CampusFACP,FACC,FAHA, [facility] Medical CenterFSCAI 12/24/2015 3:56 PM

## 2016-01-28 ENCOUNTER — Other Ambulatory Visit: Payer: Self-pay | Admitting: Cardiovascular Disease

## 2016-01-28 IMAGING — CT CT HEAD W/O CM
1 of 2 series · 16 of 30 positions shown, 20 images · non-contrast
Comparison: None.

CLINICAL DATA: Severe headache

EXAM:
CT HEAD WITHOUT CONTRAST
TECHNIQUE: Contiguous axial images were obtained from the base of the skull
through the vertex without intravenous contrast.

[Series 3: head 2.0 h70h · axial · 0.46mm/px · z∈[-88,+56]mm · 16 of 82 slices shown, 20 images]
[im 5/82  brain]
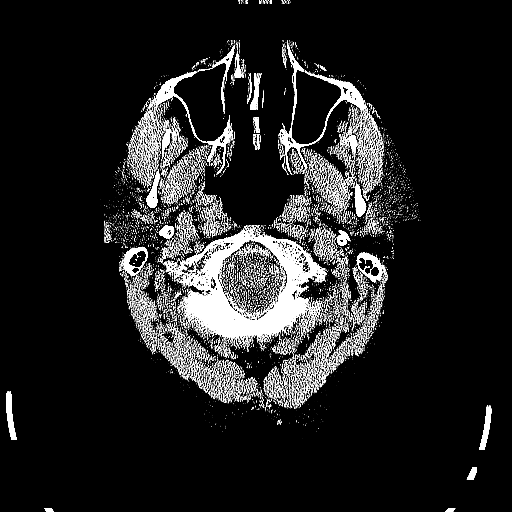
[im 5/82  bone]
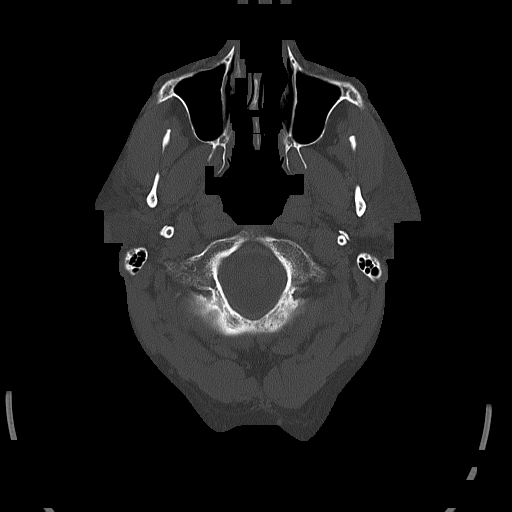
[im 9/82  brain]
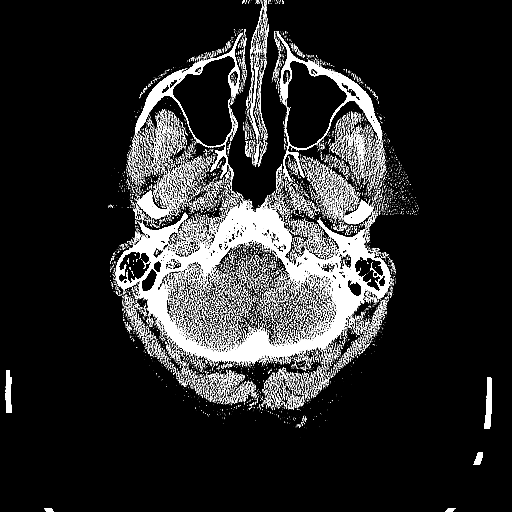
[im 13/82  brain]
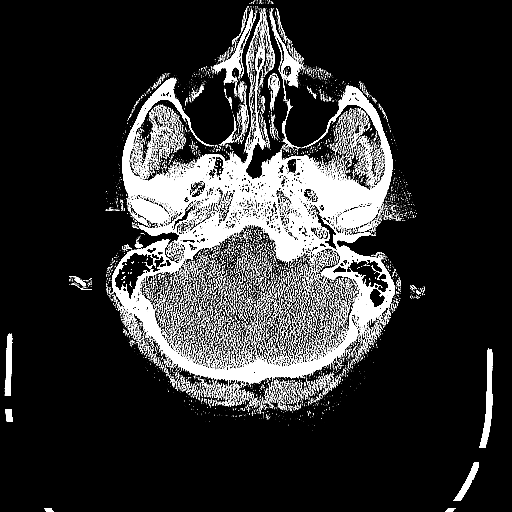
[im 21/82  brain]
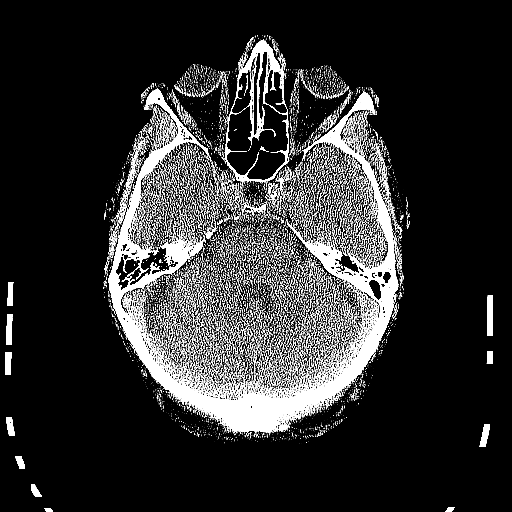
[im 25/82  brain]
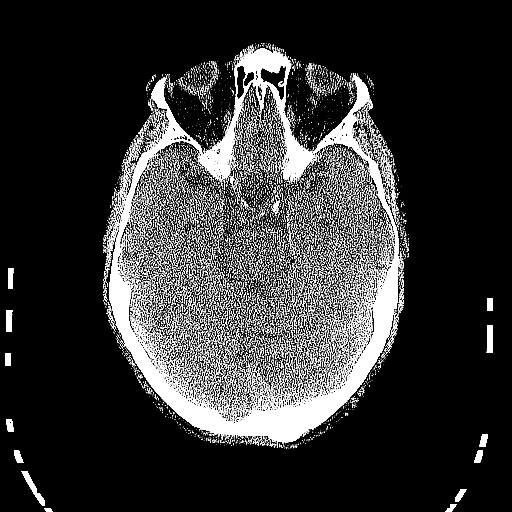
[im 25/82  bone]
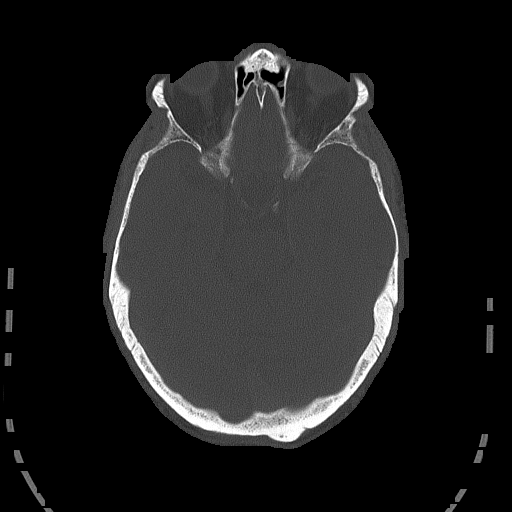
[im 29/82  brain]
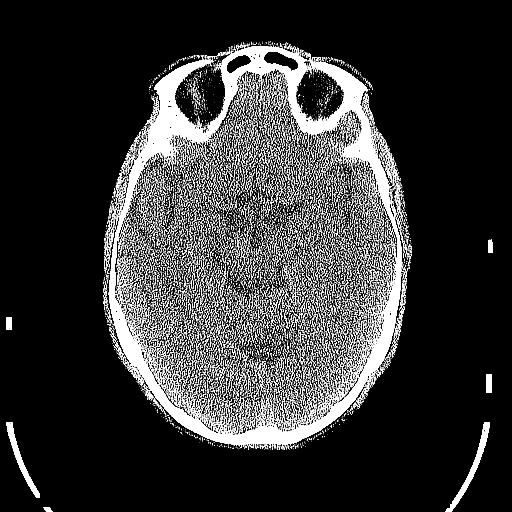
[im 33/82  brain]
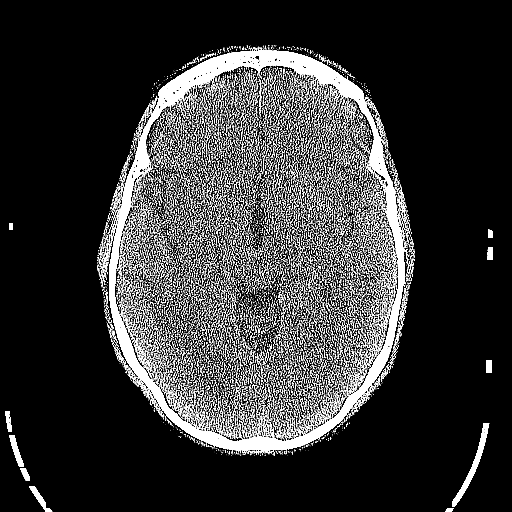
[im 37/82  brain]
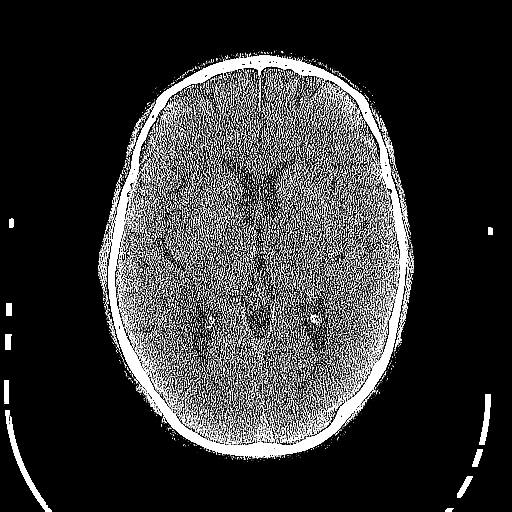
[im 45/82  brain]
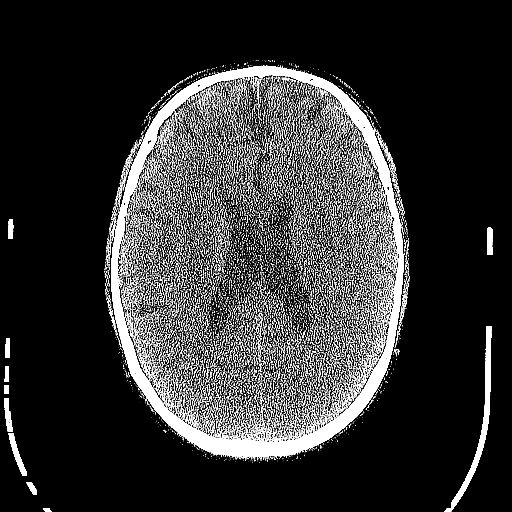
[im 45/82  bone]
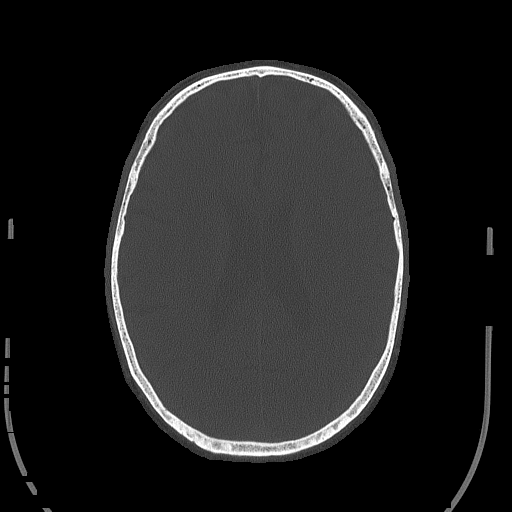
[im 49/82  brain]
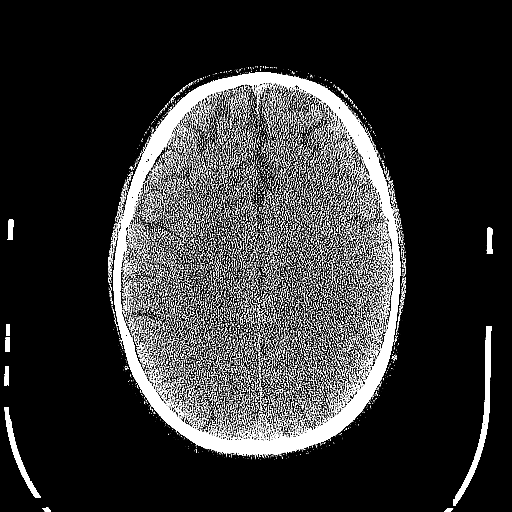
[im 53/82  brain]
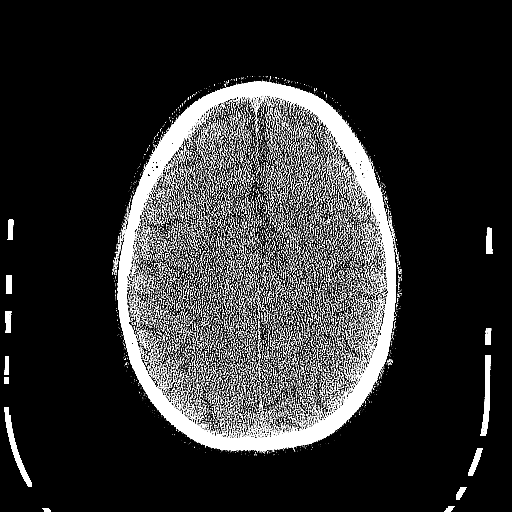
[im 57/82  brain]
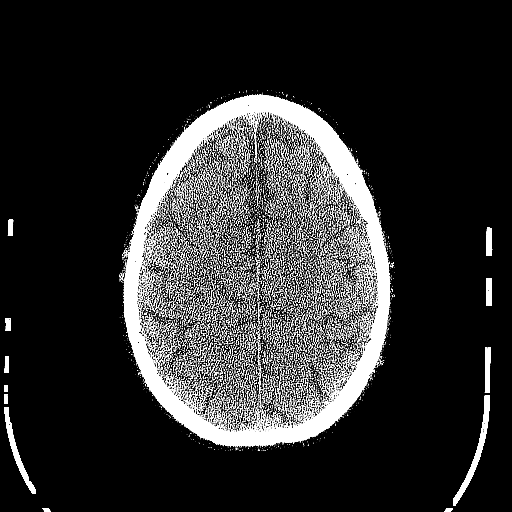
[im 61/82  brain]
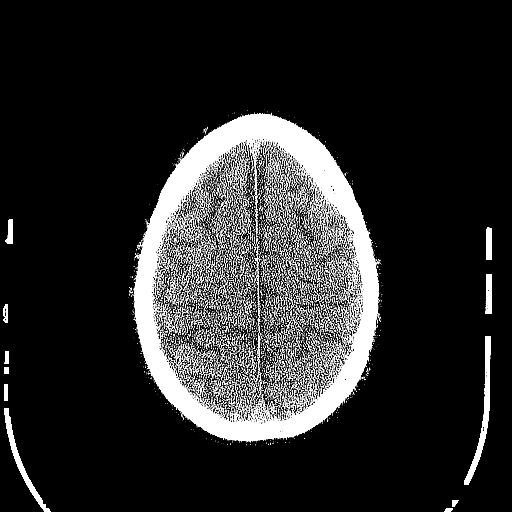
[im 61/82  bone]
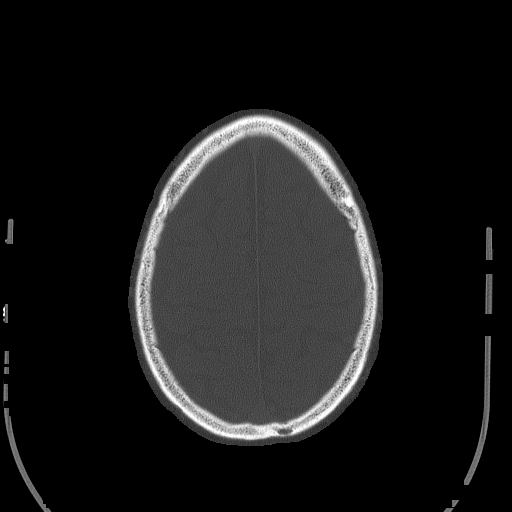
[im 69/82  brain]
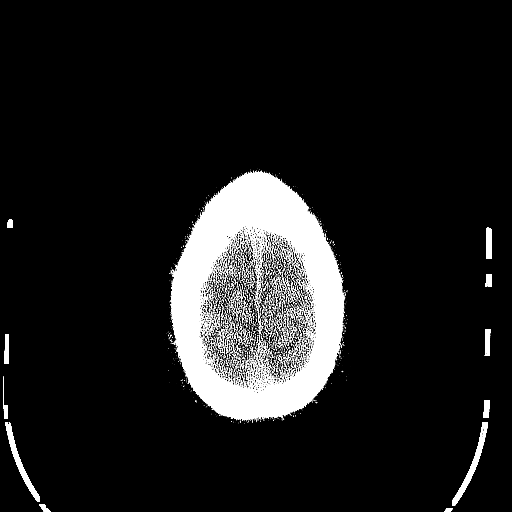
[im 73/82  brain]
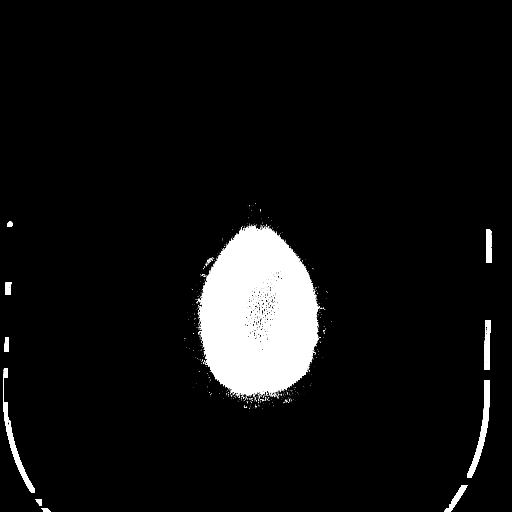
[im 77/82  brain]
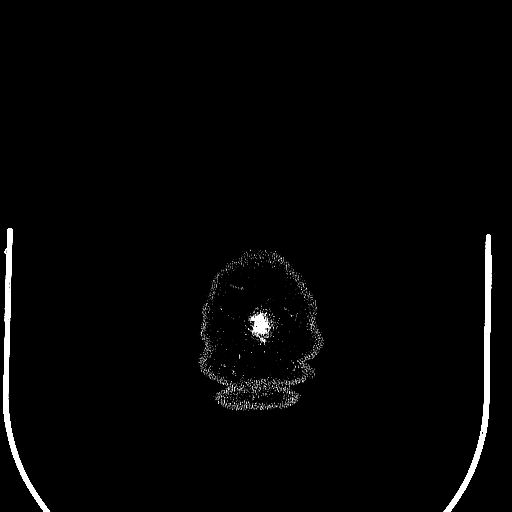

[16 of 30 positions shown; findings below may reference images not displayed]

FINDINGS: No evidence of parenchymal hemorrhage or extra-axial fluid
collection. No mass lesion, mass effect, or midline shift.

No CT evidence of acute infarction.

Cerebral volume is within normal limits.  No ventriculomegaly.

The visualized paranasal sinuses are essentially clear. The mastoid
air cells are unopacified.

No evidence of calvarial fracture.
IMPRESSION: Normal head CT.

## 2016-03-18 ENCOUNTER — Telehealth: Payer: Self-pay | Admitting: Cardiovascular Disease

## 2016-03-18 MED ORDER — AMLODIPINE BESYLATE 5 MG PO TABS: 5.0000 mg | ORAL_TABLET | Freq: Every day | ORAL | 3 refills | Status: DC

## 2016-03-18 MED ORDER — OLMESARTAN MEDOXOMIL-HCTZ 40-12.5 MG PO TABS: 1.0000 | ORAL_TABLET | Freq: Every day | ORAL | 3 refills | Status: DC

## 2016-03-18 MED ORDER — HYDRALAZINE HCL 50 MG PO TABS: 50.0000 mg | ORAL_TABLET | Freq: Two times a day (BID) | ORAL | 3 refills | Status: DC

## 2016-03-18 NOTE — Telephone Encounter (Signed)
Rx sent electronically.  

## 2016-03-18 NOTE — Telephone Encounter (Signed)
New Message      *STAT* If patient is at the pharmacy, call can be transferred to refill team.   1. Which medications need to be refilled? (please list name of each medication and dose if known)   olmesartan-hydrochlorothiazide (BENICAR HCT) 40-12.5 MG tablet    amLODipine (NORVASC) 5 MG tablet    hydrALAZINE (APRESOLINE) 50 MG tablet     2. Which pharmacy/location (including street and city if local pharmacy) is medication to be sent to? [facility] [id number] - [place], [place] - [address]  3. Do they need a 30 day or 90 day supply? 90 day supply for all

## 2016-05-28 ENCOUNTER — Ambulatory Visit (INDEPENDENT_AMBULATORY_CARE_PROVIDER_SITE_OTHER): Payer: Medicare Other | Admitting: Pharmacist Clinician (PhC)/ Clinical Pharmacy Specialist

## 2016-05-28 DIAGNOSIS — I10 Essential (primary) hypertension: Secondary | ICD-10-CM

## 2016-05-28 MED ORDER — CHLORTHALIDONE 25 MG PO TABS: 25.0000 mg | ORAL_TABLET | Freq: Every day | ORAL | 3 refills | Status: DC

## 2016-05-28 MED ORDER — AMLODIPINE BESYLATE 10 MG PO TABS: 10.0000 mg | ORAL_TABLET | Freq: Every day | ORAL | 3 refills | Status: DC

## 2016-05-28 NOTE — Patient Instructions (Signed)
Return for a a follow up appointment in 1 month  Your blood pressure today is 142/68  Check your blood pressure at home 3-4 times per week and keep record of the readings.  Take your BP meds as follows:  AM: chlorthalidone 25 mg, hydralazine 50 mg   PM: amlodipine 10 mg, hydralazine 50 mg  Stop Benicar (olmesartan/hydrochlorothiazide)  Bring all of your meds, your BP cuff and your record of home blood pressures to your next appointment.  Exercise as you're able, try to walk approximately 30 minutes per day.  Keep salt intake to a minimum, especially watch canned and prepared boxed foods.  Eat more fresh fruits and vegetables and fewer canned items.  Avoid eating in fast food restaurants.    HOW TO TAKE YOUR BLOOD PRESSURE: . Rest 5 minutes before taking your blood pressure. .  Don't smoke or drink caffeinated beverages for at least 30 minutes before. . Take your blood pressure before (not after) you eat. . Sit comfortably with your back supported and both feet on the floor (don't cross your legs). . Elevate your arm to heart level on a table or a desk. . Use the proper sized cuff. It should fit smoothly and snugly around your bare upper arm. There should be enough room to slip a fingertip under the cuff. The bottom edge of the cuff should be 1 inch above the crease of the elbow. . Ideally, take 3 measurements at one sitting and record the average.

## 2016-05-28 NOTE — Progress Notes (Signed)
05/28/2016 Kevin [name] 12-Jul-1945 [id number]   HPI:  Kevin [name] is a 71 y.o. male patient of Dr Allyson Sabal, with a PMH below who presents today for hypertension clinic evaluation.  I previously worked with him in 2015-2016 to get his blood pressure controlled, and he has done mostly well since then.  He believed much of the cause of his hypertension was work related stress.  He retired in January.  Last fall he started noticing an increase in phlegm in his sinuses.  It has become problematic and not easy to ignore, so he has been seeing different physicians trying to get a diagnosis.   His GI doctor suggested that the olmesartan could be the cause, so we are seeing him today to see about changing his medications.  His hope is that, being retired and much less stressed, he will need less medication.    His medical history is significant for BPH, hypertension and dysphonia/laryngeal spasms.  He has not taken any medications today.  Family Hx: father had hypertension for many years, died after MI at age 36.  No cardiac history in mother.  One sister with hypertension.  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  Drinks 2-3 glasses of wine most evenings  Diet: mostly fresh foods, prepares meals from scratch with no added salt;  Exercise: daily - yoga, stretches, pilates 2x/week, recumbent bike; now enjoying more golf, hiking and biking  Current antihypertensive medications: Benicar HCT 40/12.5 qd (am), amlodipine 5 mg qd (pm), hydralazine 50 mg bid  Home BP readings:  Has been checking only 2-3 times per month.  Systolic readings were in the 409+ range when trying different decongestant combinations, but are now mostly 130-145.  Current Outpatient Prescriptions  Medication Sig Dispense Refill  . amLODipine (NORVASC) 10 MG tablet Take 1 tablet (10 mg total) by mouth daily. 30 tablet 3  . aspirin 81 MG EC tablet Take 1 tablet (81 mg total) by mouth  daily. Swallow whole. 30 tablet 12  . chlorthalidone (HYGROTON) 25 MG tablet Take 1 tablet (25 mg total) by mouth daily. 30 tablet 3  . glucosamine-chondroitin 500-400 MG tablet Take 1 tablet by mouth 3 (three) times daily.    . hydrALAZINE (APRESOLINE) 50 MG tablet Take 1 tablet (50 mg total) by mouth 2 (two) times daily. 180 tablet 3  . meloxicam (MOBIC) 15 MG tablet Take 15 mg by mouth as needed.   0  . NON FORMULARY Tumeric root extract 1,050mg  daily    . olmesartan-hydrochlorothiazide (BENICAR HCT) 40-12.5 MG tablet Take 1 tablet by mouth daily. 90 tablet 3  . tadalafil (CIALIS) 20 MG tablet Take 20 mg by mouth daily as needed for erectile dysfunction.    . Testosterone (ANDROGEL PUMP) 20.25 MG/ACT (1.62%) GEL Place onto the skin. Use 4 pumps daily    . traMADol (ULTRAM) 50 MG tablet Take 50 mg by mouth as needed.   0   No current facility-administered medications for this visit.     No Known Allergies  Past Medical History:  Diagnosis Date  . Arthritis    osteoarthritis-hips/ shoulders- no issues at present  . Foley catheter in place 05/30/2013   PT HAD SURG 05/30/13 - CYSTO AND CLOT EVACUATION BLADDER / PROSTATE - UNABLE TO VOID AFTER DISCHARGE - CAME BACK TO ER SAME NIGHT AND HAD FOLEY INSERTED TO DRAINAGE BAG - URINE WAS BLOODY BUT PT REPORTS ON 06/07/13 THAT URINE IN BAG  MOSTLY CLEAR NOW.  Marland Kitchen. History of kidney stones    x1  . Hypertension     Blood pressure (!) 142/68, pulse (!) 56.  Assessment/Plan:  Patient with well controlled blood pressure, now in a much less stressful environment (retired).  There is concern that his olmesartan may be the cause of his current problems.  His blood pressure is good today so will have him discontinue the olmesartan/hctz and start chlorthalidone 25 mg each morning.  He will increase the evening amlodipine to 10 mg.  I have asked that he monitor home blood pressures 3-4 times a week for the next month.  Also advised that he cut the meloxicam back  from 15 mg daily.  His current prescription is 7.5 mg tablets, so he will take one daily and try doing without on days that he is less physically active.  Will see him back in one month for follow up.   [name] PharmD CPP [facility]

## 2016-05-28 NOTE — Assessment & Plan Note (Signed)
Patient with well controlled blood pressure, now in a much less stressful environment (retired).  There is concern that his olmesartan may be the cause of his current problems.  His blood pressure is good today so will have him discontinue the olmesartan/hctz and start chlorthalidone 25 mg each morning.  He will increase the evening amlodipine to 10 mg.  I have asked that he monitor home blood pressures 3-4 times a week for the next month.  Also advised that he cut the meloxicam back from 15 mg daily.  His current prescription is 7.5 mg tablets, so he will take one daily and try doing without on days that he is less physically active.  Will see him back in one month for follow up.

## 2016-06-24 ENCOUNTER — Encounter: Payer: Self-pay | Admitting: Pharmacist Clinician (PhC)/ Clinical Pharmacy Specialist

## 2016-06-24 ENCOUNTER — Telehealth: Payer: Self-pay | Admitting: Pharmacist Clinician (PhC)/ Clinical Pharmacy Specialist

## 2016-06-24 ENCOUNTER — Ambulatory Visit (INDEPENDENT_AMBULATORY_CARE_PROVIDER_SITE_OTHER): Payer: Medicare Other | Admitting: Pharmacist Clinician (PhC)/ Clinical Pharmacy Specialist

## 2016-06-24 VITALS — BP 142/66 | HR 72

## 2016-06-24 DIAGNOSIS — I10 Essential (primary) hypertension: Secondary | ICD-10-CM

## 2016-06-24 NOTE — Assessment & Plan Note (Signed)
Patient with isolated systolic hypertension, still not at goal, although doing better.  He has been trying to get off most medications, but is aware that he will need multiple antihypertensive drugs to get to goal.   For now he will continue with his current regimen and will get a BMET today.  Will see him back in July, should his pressure still be elevated will consider increasing one of his meds.

## 2016-06-24 NOTE — Telephone Encounter (Signed)
Patient still having problems with excessive mucus production.  Did not change with discontinuation of multiple medications including olmesartan.  Was recommended that he see an allergist and a friend suggested [facility]an [facility].  Would like a referral.

## 2016-06-24 NOTE — Progress Notes (Signed)
06/24/2016 Kevin ComberRobert E Thomley Sep 07, 1945 [id number]   HPI:  Kevin [name] is a 71 y.o. male patient of Dr Allyson SabalBerry, with a PMH below who presents today for hypertension clinic follow up.  He had been on olmesartan/hctz for many years, but there was a suspicion by his GI MD that it could be the cause of his chronic mucus production problem.  We discontinued the combination and gave him chlorthalidone 25 mg in addition to increasing amlodipine from 5 mg to 10 mg.  He returns today for follow up.     Unfortunately his mucus problem did not abate, or even lessen, by stopping the olmesartan.  He is now wanting referral to Allergy/Asthma for evaluation.  He has been taking Mucinex without benefit since October.    His medical history is significant for BPH, hypertension and dysphonia/laryngeal spasms.  In the past he believed much of his problem with hypertension was related to job stress, but now that he is retired he still has hypertension issues.  Family Hx: father had hypertension for many years, died after MI at age 70.  No cardiac history in mother.  One sister with hypertension.  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  Drinks 2-3 glasses of wine most evenings  Diet: mostly fresh foods, prepares meals from scratch with no added salt;  Exercise: daily - yoga, stretches, pilates 2x/week, recumbent bike; now enjoying more golf, hiking and biking  Current antihypertensive medications:  amlodipine 10 mg qd (pm), hydralazine 50 mg bid, chlorthalidone 25 mg qd (am)  Home BP readings:  Has 7 readings in the past 3 weeks, average of 136/59 with range of 129-140/56-61    HR average 61 range 55-71.  Current Outpatient Prescriptions  Medication Sig Dispense Refill  . amLODipine (NORVASC) 10 MG tablet Take 1 tablet (10 mg total) by mouth daily. 30 tablet 3  . aspirin 81 MG EC tablet Take 1 tablet (81 mg total) by mouth daily. Swallow whole. 30 tablet 12  .  chlorthalidone (HYGROTON) 25 MG tablet Take 1 tablet (25 mg total) by mouth daily. 30 tablet 3  . glucosamine-chondroitin 500-400 MG tablet Take 1 tablet by mouth 3 (three) times daily.    . hydrALAZINE (APRESOLINE) 50 MG tablet Take 1 tablet (50 mg total) by mouth 2 (two) times daily. 180 tablet 3  . NON FORMULARY Tumeric root extract 1,050mg  daily    . tadalafil (CIALIS) 20 MG tablet Take 20 mg by mouth daily as needed for erectile dysfunction.    . traMADol (ULTRAM) 50 MG tablet Take 50 mg by mouth as needed.   0   No current facility-administered medications for this visit.     No Known Allergies  Past Medical History:  Diagnosis Date  . Arthritis    osteoarthritis-hips/ shoulders- no issues at present  . Foley catheter in place 05/30/2013   PT HAD SURG 05/30/13 - CYSTO AND CLOT EVACUATION BLADDER / PROSTATE - UNABLE TO VOID AFTER DISCHARGE - CAME BACK TO ER SAME NIGHT AND HAD FOLEY INSERTED TO DRAINAGE BAG - URINE WAS BLOODY BUT PT REPORTS ON 06/07/13 THAT URINE IN BAG MOSTLY CLEAR NOW.  Marland Kitchen. History of kidney stones    x1  . Hypertension     Blood pressure (!) 142/66, pulse 72.  Assessment/Plan:  Patient with isolated systolic hypertension, still not at goal, although doing better.  He has been trying to get off most medications, but is aware  that he will need multiple antihypertensive drugs to get to goal.   For now he will continue with his current regimen and will get a BMET today.  Will see him back in July, should his pressure still be elevated will consider increasing one of his meds.     [name] PharmD CPP [facility]

## 2016-06-24 NOTE — Patient Instructions (Signed)
Return for a a follow up appointment in 2 months   Your blood pressure today is 142/66  Check your blood pressure at home several times each week and keep record of the readings.  Take your BP meds as follows:  Continue with your current medications  Bring all of your meds, your BP cuff and your record of home blood pressures to your next appointment.  Exercise as you're able, try to walk approximately 30 minutes per day.  Keep salt intake to a minimum, especially watch canned and prepared boxed foods.  Eat more fresh fruits and vegetables and fewer canned items.  Avoid eating in fast food restaurants.    HOW TO TAKE YOUR BLOOD PRESSURE: . Rest 5 minutes before taking your blood pressure. .  Don't smoke or drink caffeinated beverages for at least 30 minutes before. . Take your blood pressure before (not after) you eat. . Sit comfortably with your back supported and both feet on the floor (don't cross your legs). . Elevate your arm to heart level on a table or a desk. . Use the proper sized cuff. It should fit smoothly and snugly around your bare upper arm. There should be enough room to slip a fingertip under the cuff. The bottom edge of the cuff should be 1 inch above the crease of the elbow. . Ideally, take 3 measurements at one sitting and record the average.

## 2016-06-25 LAB — BASIC METABOLIC PANEL
BUN/Creatinine Ratio: 15 (ref 10–24)
BUN: 15 mg/dL (ref 8–27)
CO2: 29 mmol/L (ref 18–29)
Calcium: 9.5 mg/dL (ref 8.6–10.2)
Chloride: 94 mmol/L — ABNORMAL LOW (ref 96–106)
Creatinine, Ser: 1 mg/dL (ref 0.76–1.27)
GFR calc Af Amer: 88 mL/min/{1.73_m2} (ref >59)
GFR calc non Af Amer: 76 mL/min/{1.73_m2} (ref >59)
Glucose: 101 mg/dL — ABNORMAL HIGH (ref 65–99)
Potassium: 3.4 mmol/L — ABNORMAL LOW (ref 3.5–5.2)
Sodium: 137 mmol/L (ref 134–144)

## 2016-07-06 ENCOUNTER — Telehealth: Payer: Self-pay | Admitting: Cardiovascular Disease

## 2016-07-06 NOTE — Telephone Encounter (Signed)
[facility] and she was going to forward a message to [facility] about getting a referral to an allergist Dr. Elyn AquasWendt-hasn't heard anything-do not see referral in system--pt checking on status

## 2016-07-12 NOTE — Telephone Encounter (Signed)
Let's get him a referrral

## 2016-07-20 ENCOUNTER — Other Ambulatory Visit: Payer: Self-pay | Admitting: Cardiovascular Disease

## 2016-07-20 DIAGNOSIS — Z789 Other specified health status: Secondary | ICD-10-CM

## 2016-07-20 NOTE — Telephone Encounter (Signed)
Referral placed to allergist. 

## 2016-08-13 ENCOUNTER — Ambulatory Visit: Payer: Medicare Other

## 2016-08-23 ENCOUNTER — Other Ambulatory Visit: Payer: Self-pay | Admitting: Cardiovascular Disease

## 2016-08-27 ENCOUNTER — Other Ambulatory Visit: Payer: Self-pay | Admitting: Cardiovascular Disease

## 2016-11-17 ENCOUNTER — Ambulatory Visit (INDEPENDENT_AMBULATORY_CARE_PROVIDER_SITE_OTHER): Payer: Medicare Other | Admitting: Cardiovascular Disease

## 2016-11-17 ENCOUNTER — Encounter: Payer: Self-pay | Admitting: Cardiovascular Disease

## 2016-11-17 ENCOUNTER — Other Ambulatory Visit: Payer: Self-pay | Admitting: Cardiovascular Disease

## 2016-11-17 VITALS — BP 158/70 | HR 75 | Ht 69.0 in | Wt 201.8 lb

## 2016-11-17 DIAGNOSIS — I10 Essential (primary) hypertension: Secondary | ICD-10-CM

## 2016-11-17 MED ORDER — HYDRALAZINE HCL 100 MG PO TABS: 100.0000 mg | ORAL_TABLET | Freq: Two times a day (BID) | ORAL | 0 refills | Status: DC

## 2016-11-17 NOTE — Addendum Note (Signed)
Addended by: Ayvah Caroll L on: 11/17/2016 04:19 PM   Modules accepted: Orders  

## 2016-11-17 NOTE — Assessment & Plan Note (Signed)
Mr. Kevin [name] returns today for follow-up of his hypertension. He currently is on amlodipine, Hygroton, and hydralazine. His major issue is with morning mucus production and cough. He's had extensive evaluation of this including ENT and GI. He is currently getting a esophageal pH probe placed. The question is whether or not one of his blood pressure medicines is contributory. His pressure today is 158/70. He has been under a lot of stress because of this. He has lost 13 pounds as well. I'm going to sequentially eliminate each of his medications and assess for improvement in his symptoms. I'll see him back in 4 months for follow-up.

## 2016-11-17 NOTE — Progress Notes (Signed)
11/17/2016 Kevin Kevin   12/22/1945  [id number]  Primary Physician [facility] Marcos Eke, MD Primary Cardiologist: Kevin Gess MD [facility], [place]  HPI:  Kevin Kevin is a 71 y.o. male mildly overweight married Caucasian male who works at [employer]. I last saw him in the office 12/24/15.He has a history of hypertension on ACE inhibitor as in the past. I last saw him in the office 09/26/14. His history otherwise is remarkable for multiple joint replacements as well as prostate resection because of prostate surgery, with ureteral complications. His ACE inhibitor was recently changed to an ARB (losartan) because of a hoarse voice. He probably needs vocal cord surgery next week. [facility] was seen at [facility] today for preoperative evaluation with blood pressure was measured multiple times in the 190/90 range. Since I saw him in the office we have been titrating his blood pressure medicines to the point now that his blood pressure is much better controlled in the 130/6070 range. He currently is on hydralazine, Benicar andamlodipine. He denies chest pain or shortness of breath. He did have some vague ocular phenomenon for which he saw his ophthalmologist. 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 I referred him to [facility] for neurologic evaluation. An MRI/MRA was unremarkable. His blood pressure has been under much better control on his current medical regimen. He follows his blood pressure as an outpatient at home. He has had issues with morning mucus production and coughing/gagging for the last 12 months or so. This has been evaluated by ENT, gastric neurology and allergy. There has been no uniform consensus on the etiology.    Current Meds  Medication Sig  . aspirin 81 MG EC tablet Take 1 tablet (81 mg total) by mouth daily. Swallow whole.  . Azelastine-Fluticasone (DYMISTA) 137-50 MCG/ACT SUSP Place 1 spray into the nose 2 (two) times daily.  . chlorthalidone  (HYGROTON) 25 MG tablet TAKE 1 TABLET BY MOUTH EVERY DAY  . diphenhydrAMINE (BENADRYL ALLERGY) 25 MG tablet Take 25 mg by mouth every 6 (six) hours as needed.  . fexofenadine (ALLEGRA) 180 MG tablet Take 180 mg by mouth daily.  Marland Kitchen glucosamine-chondroitin 500-400 MG tablet Take 1 tablet by mouth 3 (three) times daily.  Marland Kitchen guaiFENesin (MUCINEX) 600 MG 12 hr tablet Take 1,200 mg by mouth daily.  . hydrALAZINE (APRESOLINE) 50 MG tablet Take 1 tablet (50 mg total) by mouth 2 (two) times daily.  Marland Kitchen ipratropium (ATROVENT) 0.06 % nasal spray Place 2 sprays into both nostrils 2 (two) times daily.  . montelukast (SINGULAIR) 10 MG tablet Take 1 tablet by mouth daily.  . NON FORMULARY Tumeric root extract 1,050mg  daily  . pantoprazole (PROTONIX) 20 MG tablet Take 20 mg by mouth daily.  . tadalafil (CIALIS) 20 MG tablet Take 20 mg by mouth daily as needed for erectile dysfunction.  . traMADol (ULTRAM) 50 MG tablet Take 50 mg by mouth as needed.   . [DISCONTINUED] amLODipine (NORVASC) 10 MG tablet Take 1 tablet (10 mg total) by mouth daily. Follow up due in December. Please call and schedule [phone]     No Known Allergies  Social History   Social History  . Marital status: Married    Spouse name: N/A  . Number of children: 2  . Years of education: College gr   Occupational History  . Investment advisor    Social History Main Topics  . Smoking status: Never Smoker  . Smokeless tobacco: Never  Used  . Alcohol use Yes     Comment: social 1-2 drinks weekly  . Drug use: No  . Sexual activity: Yes   Other Topics Concern  . Not on file   Social History Narrative  . No narrative on file     Review of Systems: General: negative for chills, fever, night sweats or weight changes.  Cardiovascular: negative for chest pain, dyspnea on exertion, edema, orthopnea, palpitations, paroxysmal nocturnal dyspnea or shortness of breath Dermatological: negative for rash Respiratory: negative for cough or  wheezing Urologic: negative for hematuria Abdominal: negative for nausea, vomiting, diarrhea, bright red blood per rectum, melena, or hematemesis Neurologic: negative for visual changes, syncope, or dizziness All other systems reviewed and are otherwise negative except as noted above.    Blood pressure (!) 158/70, pulse 75, height 5\' 9"  (1.753 m), weight 201 lb 12.8 oz (91.5 kg).  General appearance: alert and no distress Neck: no adenopathy, no carotid bruit, no JVD, supple, symmetrical, trachea midline and thyroid not enlarged, symmetric, no tenderness/mass/nodules Lungs: clear to auscultation bilaterally Heart: regular rate and rhythm, S1, S2 normal, no murmur, click, rub or gallop Extremities: extremities normal, atraumatic, no cyanosis or edema Pulses: 2+ and symmetric Skin: Skin color, texture, turgor normal. No rashes or lesions Neurologic: Alert and oriented X 3, normal strength and tone. Normal symmetric reflexes. Normal coordination and gait  EKG sinus rhythm at 75 with left axis deviation and nonspecific ST and T-wave changes. I personally reviewed this EKG.  ASSESSMENT AND PLAN:   Essential hypertension [facility] returns today for follow-up of his hypertension. He currently is on amlodipine, Hygroton, and hydralazine. His major issue is with morning mucus production and cough. He's had extensive evaluation of this including ENT and GI. He is currently getting a esophageal pH probe placed. The question is whether or not one of his blood pressure medicines is contributory. His pressure today is 158/70. He has been under a lot of stress because of this. He has lost 13 pounds as well. I'm going to sequentially eliminate each of his medications and assess for improvement in his symptoms. I'll see him back in 4 months for follow-up.      Kevin GessJonathan J. Alegandro Macnaughton MD FACP,FACC,FAHA, [facility] 11/17/2016 3:57 PM

## 2016-11-17 NOTE — Patient Instructions (Signed)
Medication Instructions: STOP Amlodipine  Increase Hydralazine to 100 mg twice daily. Take an extra 50 mg (1/2 tab) if your systolic BP (top number) is greater than 150.   Follow-Up: Your physician recommends that you schedule a follow-up appointment in: 4 months with Dr. Allyson SabalBerry.  If you need a refill on your cardiac medications before your next appointment, please call your pharmacy.

## 2016-11-18 NOTE — Telephone Encounter (Signed)
REFILL 

## 2016-12-15 ENCOUNTER — Ambulatory Visit: Payer: Medicare Other

## 2016-12-17 ENCOUNTER — Encounter: Payer: Medicare Other | Admitting: Pharmacist Clinician (PhC)/ Clinical Pharmacy Specialist

## 2016-12-17 NOTE — Progress Notes (Signed)
Appointment cancelled.  Since this appt was scheduled, patient has come to believe that mucus issue is related to potential hole in his esophagus.   [facility] to determine if GI related.   We will wait until after the results of that testing before continuing to see if any of the problems are related to cardiac medications.    Patient is agreeable to wait.  He is tired of switching medications around.  His home BP is running consistently in the low 150 systolic range.  For now I am going to cancel this appointment and leave his medications unchanged until we get the results of next week's study.     This encounter was created in error - please disregard.

## 2016-12-22 ENCOUNTER — Other Ambulatory Visit: Payer: Self-pay | Admitting: Cardiovascular Disease

## 2017-01-18 ENCOUNTER — Other Ambulatory Visit (INDEPENDENT_AMBULATORY_CARE_PROVIDER_SITE_OTHER): Payer: Self-pay | Admitting: Otolaryngology

## 2017-01-18 DIAGNOSIS — J329 Chronic sinusitis, unspecified: Secondary | ICD-10-CM

## 2017-02-25 ENCOUNTER — Ambulatory Visit
Admission: RE | Admit: 2017-02-25 | Discharge: 2017-02-25 | Disposition: A | Discharge status: Home / Self Care | Payer: Medicare Other | Source: Ambulatory Visit | Attending: Otolaryngology | Admitting: Otolaryngology

## 2017-02-25 DIAGNOSIS — J329 Chronic sinusitis, unspecified: Secondary | ICD-10-CM

## 2017-03-03 ENCOUNTER — Telehealth: Payer: Self-pay | Admitting: Cardiovascular Disease

## 2017-03-03 NOTE — Telephone Encounter (Signed)
Closed Encounter  °

## 2017-03-08 ENCOUNTER — Other Ambulatory Visit: Payer: Self-pay | Admitting: Cardiovascular Disease

## 2017-03-09 ENCOUNTER — Ambulatory Visit: Payer: Medicare Other | Admitting: Cardiovascular Disease

## 2017-03-10 ENCOUNTER — Encounter: Payer: Self-pay | Admitting: Cardiovascular Disease

## 2017-03-10 ENCOUNTER — Ambulatory Visit (INDEPENDENT_AMBULATORY_CARE_PROVIDER_SITE_OTHER): Payer: Medicare Other | Admitting: Cardiovascular Disease

## 2017-03-10 DIAGNOSIS — I10 Essential (primary) hypertension: Secondary | ICD-10-CM

## 2017-03-10 NOTE — Assessment & Plan Note (Signed)
History of essential hypertension with blood pressure measured to 152/75. The second blood pressure regulated home which averages 138/64. He is on hydralazine and chlorthalidone. Continue current meds at current dosing.

## 2017-03-10 NOTE — Patient Instructions (Signed)

## 2017-03-10 NOTE — Progress Notes (Signed)
03/10/2017 Kevin [name]   01-Feb-1945  [id number]  Primary Physician Hal Hope Marcos Eke, MD Primary Cardiologist: Runell Gess MD Nicholes Calamity, [place]  HPI:  Kevin [name] is a 72 y.o.  mildly overweight married Caucasian male who works at [employer]. I last saw him in the office  11/17/16.He has a history of hypertension on ACE inhibitor as in the past. I last saw him in the office 09/26/14. His history otherwise is remarkable for multiple joint replacements as well as prostate resection because of prostate surgery, with ureteral complications. His ACE inhibitor was recently changed to an ARB (losartan) because of a hoarse voice. He probably needs vocal cord surgery next week. He was seen at [facility] today for preoperative evaluation with blood pressure was measured multiple times in the 190/90 range. Since I saw him in the office we have been titrating his blood pressure medicines to the point now that his blood pressure is much better controlled in the 130/6070 range. He currently is on hydralazine, Benicar andamlodipine. He denies chest pain or shortness of breath. He did have some vague ocular phenomenon for which he saw his ophthalmologist. 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 I referred him to Dr. Pearlean Brownie for neurologic evaluation. An MRI/MRA was unremarkable. His blood pressure has been under much better control on his current medical regimen. He follows his blood pressure as an outpatient at home. He has had issues with morning mucus production and coughing/gagging for the last 12 months or so. This has been evaluated by ENT, gastric neurology and allergy. There has been no uniform consensus on the etiology.  This mucus production has somewhat improved over last several months. He is otherwise asymptomatic with excellent blood pressure measurements at home on minimal medications.      Current Meds  Medication Sig  . aspirin 81 MG EC tablet Take 1 tablet  (81 mg total) by mouth daily. Swallow whole.  . chlorthalidone (HYGROTON) 25 MG tablet TAKE 1 TABLET BY MOUTH EVERY DAY  . fexofenadine (ALLEGRA) 180 MG tablet Take 180 mg by mouth daily.  Marland Kitchen glucosamine-chondroitin 500-400 MG tablet Take 1 tablet by mouth 3 (three) times daily.  . hydrALAZINE (APRESOLINE) 100 MG tablet TAKE 1 TABLET BY MOUTH TWICE DAILY( TAKE AN EXTRA 1/2 TABLET BY MOUTH IF SYSTOLIC BLOOD PRESSURE IS GREATER THAN 150)  . montelukast (SINGULAIR) 10 MG tablet Take 1 tablet by mouth daily.  . NON FORMULARY Tumeric root extract 1,050mg  daily  . tadalafil (CIALIS) 20 MG tablet Take 20 mg by mouth daily as needed for erectile dysfunction.     No Known Allergies  Social History   Socioeconomic History  . Marital status: Married    Spouse name: Not on file  . Number of children: 2  . Years of education: College gr  . Highest education level: Not on file  Social Needs  . Financial resource strain: Not on file  . Food insecurity - worry: Not on file  . Food insecurity - inability: Not on file  . Transportation needs - medical: Not on file  . Transportation needs - non-medical: Not on file  Occupational History  . Occupation: Scientist, research (medical)  Tobacco Use  . Smoking status: Never Smoker  . Smokeless tobacco: Never Used  Substance and Sexual Activity  . Alcohol use: Yes    Comment: social 1-2 drinks weekly  . Drug use: No  . Sexual activity: Yes  Other Topics Concern  .  Not on file  Social History Narrative  . Not on file     Review of Systems: General: negative for chills, fever, night sweats or weight changes.  Cardiovascular: negative for chest pain, dyspnea on exertion, edema, orthopnea, palpitations, paroxysmal nocturnal dyspnea or shortness of breath Dermatological: negative for rash Respiratory: negative for cough or wheezing Urologic: negative for hematuria Abdominal: negative for nausea, vomiting, diarrhea, bright red blood per rectum, melena, or  hematemesis Neurologic: negative for visual changes, syncope, or dizziness All other systems reviewed and are otherwise negative except as noted above.    Blood pressure (!) 152/75, pulse 76, height 5\' 9"  (1.753 m), weight 205 lb (93 kg).  General appearance: alert and no distress Neck: no adenopathy, no carotid bruit, no JVD, supple, symmetrical, trachea midline and thyroid not enlarged, symmetric, no tenderness/mass/nodules Lungs: clear to auscultation bilaterally Heart: regular rate and rhythm, S1, S2 normal, no murmur, click, rub or gallop Extremities: extremities normal, atraumatic, no cyanosis or edema Pulses: 2+ and symmetric Skin: Skin color, texture, turgor normal. No rashes or lesions Neurologic: Alert and oriented X 3, normal strength and tone. Normal symmetric reflexes. Normal coordination and gait  EKG not performed today  ASSESSMENT AND PLAN:   Essential hypertension History of essential hypertension with blood pressure measured to 152/75. The second blood pressure regulated home which averages 138/64. He is on hydralazine and chlorthalidone. Continue current meds at current dosing.      Runell GessJonathan J. Meziah Blasingame MD FACP,FACC,FAHA, [facility] 03/10/2017 11:59 AM

## 2017-03-15 ENCOUNTER — Other Ambulatory Visit: Payer: Self-pay | Admitting: Cardiovascular Disease

## 2017-03-15 NOTE — Telephone Encounter (Signed)
REFILL 

## 2017-03-16 ENCOUNTER — Other Ambulatory Visit: Payer: Self-pay | Admitting: Cardiovascular Disease

## 2017-04-06 ENCOUNTER — Telehealth: Payer: Self-pay | Admitting: Cardiovascular Disease

## 2017-04-06 ENCOUNTER — Other Ambulatory Visit: Payer: Self-pay | Admitting: Cardiovascular Disease

## 2017-04-06 DIAGNOSIS — R002 Palpitations: Secondary | ICD-10-CM

## 2017-04-06 NOTE — Telephone Encounter (Signed)
New Message:     Pt says he have been experiencing real hard rapid heart beats,this is off and on. Pt would like to be seen.

## 2017-04-06 NOTE — Telephone Encounter (Signed)
Returned call to patient, spoke with wife per his request. He is having hard, fast heart beats that is occasional but enough to cause him some concern. He has been monitoring his BP and HR. He was hiking in [place] and just returned and reported he had these episodes after hiking, not necessarily with exertion. He also had these episodes prior this this trip to, so in total this has been going on for a few weeks. Advised to call back if he develops CP, SOB with the heart rate/beat issue otherwise plan to f/up on 3/22 @ 845am with MD and bring list of BP/HR readings to this appointment.   Routed to MD/CMA as Lorain ChildesFYI

## 2017-04-07 ENCOUNTER — Other Ambulatory Visit: Payer: Self-pay | Admitting: Cardiovascular Disease

## 2017-04-07 NOTE — Telephone Encounter (Signed)
Spoke to pt yesterday. Per Dr. Allyson SabalBerry, pt needs cardiac event monitor. Pt agreed with this plan and verbalized thanks for the call.   Message sent to scheduling to arrange this appt.

## 2017-04-09 ENCOUNTER — Ambulatory Visit: Payer: Medicare Other | Admitting: Cardiovascular Disease

## 2017-04-09 NOTE — Telephone Encounter (Signed)
Appt scheduled for 04/19/17 to have monitor placed.

## 2017-04-19 ENCOUNTER — Ambulatory Visit (INDEPENDENT_AMBULATORY_CARE_PROVIDER_SITE_OTHER): Payer: Medicare Other

## 2017-04-19 DIAGNOSIS — R002 Palpitations: Secondary | ICD-10-CM | POA: Diagnosis not present

## 2017-04-26 ENCOUNTER — Other Ambulatory Visit: Payer: Self-pay | Admitting: Cardiovascular Disease

## 2017-04-26 ENCOUNTER — Other Ambulatory Visit: Payer: Self-pay

## 2017-04-26 ENCOUNTER — Ambulatory Visit (HOSPITAL_COMMUNITY): Payer: Medicare Other | Attending: Cardiology

## 2017-04-26 DIAGNOSIS — R002 Palpitations: Secondary | ICD-10-CM

## 2017-04-26 DIAGNOSIS — I10 Essential (primary) hypertension: Secondary | ICD-10-CM | POA: Insufficient documentation

## 2017-04-26 DIAGNOSIS — I272 Pulmonary hypertension, unspecified: Secondary | ICD-10-CM | POA: Insufficient documentation

## 2017-04-27 ENCOUNTER — Encounter: Payer: Self-pay | Admitting: Cardiovascular Disease

## 2017-04-27 ENCOUNTER — Ambulatory Visit (INDEPENDENT_AMBULATORY_CARE_PROVIDER_SITE_OTHER): Payer: Medicare Other | Admitting: Cardiovascular Disease

## 2017-04-27 VITALS — BP 130/60 | HR 69 | Ht 69.0 in | Wt 207.0 lb

## 2017-04-27 DIAGNOSIS — I10 Essential (primary) hypertension: Secondary | ICD-10-CM

## 2017-04-27 DIAGNOSIS — R002 Palpitations: Secondary | ICD-10-CM

## 2017-04-27 DIAGNOSIS — I351 Nonrheumatic aortic (valve) insufficiency: Secondary | ICD-10-CM | POA: Insufficient documentation

## 2017-04-27 NOTE — Progress Notes (Signed)
Kevin LefortBob Kaneko for follow-up of some of his non-invasive diagnostic tests performed because of palpitations. His monitor showed sinus rhythm during the episodes of palpitations that he was experiencing. 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 Because his family history I'm going to get a core coronary artery calcium score to establish whether or not he has any demonstrable CAD.  Runell GessJonathan J. Fenix Rorke, M.D., FACP, [facility] Center[facility]ACC, Earl LagosFAHA, [facility] [address] [place], [place]  [id number]  [phone] 04/27/2017 10:58 AM

## 2017-04-27 NOTE — Assessment & Plan Note (Signed)
History of essential hypertension blood pressure measured 130/60. Blood pressures measured at home  appear to be therapeutic. He is on chlorthalidone, and hydralazine. Continue current meds at current dosing

## 2017-04-27 NOTE — Patient Instructions (Signed)
Medication Instructions:  Your physician recommends that you continue on your current medications as directed. Please refer to the Current Medication list given to you today.   Labwork: none  Testing/Procedures: Your Dr. Allyson SabalBerry recommends that you get a Coronary Calcium Score. [facility], [address]. The cost is $150 due at the time of arrive. The please arrive 15 minutes before scheduled time and it should last about 30 minutes.   Follow-Up: Your physician wants you to follow-up in: 6 months with Dr. Allyson SabalBerry. You will receive a reminder letter in the mail two months in advance. If you don't receive a letter, please call our office to schedule the follow-up appointment.   Any Other Special Instructions Will Be Listed Below (If Applicable).     If you need a refill on your cardiac medications before your next appointment, please call your pharmacy.

## 2017-04-27 NOTE — Assessment & Plan Note (Signed)
history of palpitations with recent event monitor to that did not show any arrhythmias during some symptoms.

## 2017-04-27 NOTE — Assessment & Plan Note (Signed)
Recent 2-D echo showed moderate AI. LV size and function were normal. We'll continue to follow this on an annual basis. 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

## 2017-04-29 ENCOUNTER — Telehealth: Payer: Self-pay | Admitting: Cardiovascular Disease

## 2017-04-29 NOTE — Telephone Encounter (Signed)
Returned call to Pt. States he has already spoken to Dr. Allyson SabalBerry at last visit about ECHO results.

## 2017-04-29 NOTE — Telephone Encounter (Signed)
Follow Up:     Returning Kevin [name]'s call,concerning his Echoi results.

## 2017-05-05 ENCOUNTER — Other Ambulatory Visit: Payer: Self-pay | Admitting: Cardiovascular Disease

## 2017-05-05 DIAGNOSIS — I351 Nonrheumatic aortic (valve) insufficiency: Secondary | ICD-10-CM

## 2017-05-07 ENCOUNTER — Other Ambulatory Visit: Payer: Self-pay | Admitting: Cardiovascular Disease

## 2017-05-07 ENCOUNTER — Ambulatory Visit (INDEPENDENT_AMBULATORY_CARE_PROVIDER_SITE_OTHER)
Admission: RE | Admit: 2017-05-07 | Discharge: 2017-05-07 | Disposition: A | Discharge status: Home / Self Care | Payer: Medicare Other | Source: Ambulatory Visit | Attending: Cardiovascular Disease | Admitting: Cardiovascular Disease

## 2017-05-07 DIAGNOSIS — I10 Essential (primary) hypertension: Secondary | ICD-10-CM

## 2017-05-07 DIAGNOSIS — R002 Palpitations: Secondary | ICD-10-CM

## 2017-05-10 ENCOUNTER — Other Ambulatory Visit: Payer: Medicare Other

## 2017-05-28 ENCOUNTER — Other Ambulatory Visit: Payer: Self-pay | Admitting: Cardiovascular Disease

## 2017-05-28 DIAGNOSIS — R931 Abnormal findings on diagnostic imaging of heart and coronary circulation: Secondary | ICD-10-CM

## 2017-05-28 DIAGNOSIS — R002 Palpitations: Secondary | ICD-10-CM

## 2017-05-29 ENCOUNTER — Other Ambulatory Visit: Payer: Self-pay | Admitting: Cardiovascular Disease

## 2017-05-31 NOTE — Telephone Encounter (Signed)
Rx request sent to pharmacy.  

## 2017-06-02 ENCOUNTER — Telehealth: Payer: Self-pay | Admitting: Cardiovascular Disease

## 2017-06-02 NOTE — Telephone Encounter (Signed)
Left message for patient to call back.  Needs to schedule exercise myoview.

## 2017-06-17 ENCOUNTER — Telehealth (HOSPITAL_COMMUNITY): Payer: Self-pay

## 2017-06-17 NOTE — Telephone Encounter (Signed)
Encounter complete. 

## 2017-06-22 ENCOUNTER — Ambulatory Visit (HOSPITAL_COMMUNITY)
Admission: RE | Admit: 2017-06-22 | Discharge: 2017-06-22 | Disposition: A | Discharge status: Home / Self Care | Payer: Medicare Other | Source: Ambulatory Visit | Attending: Cardiovascular Disease | Admitting: Cardiovascular Disease

## 2017-06-22 DIAGNOSIS — R931 Abnormal findings on diagnostic imaging of heart and coronary circulation: Secondary | ICD-10-CM

## 2017-06-22 DIAGNOSIS — I2510 Atherosclerotic heart disease of native coronary artery without angina pectoris: Secondary | ICD-10-CM | POA: Insufficient documentation

## 2017-06-22 DIAGNOSIS — R002 Palpitations: Secondary | ICD-10-CM

## 2017-06-22 DIAGNOSIS — I10 Essential (primary) hypertension: Secondary | ICD-10-CM | POA: Insufficient documentation

## 2017-06-22 DIAGNOSIS — Z8249 Family history of ischemic heart disease and other diseases of the circulatory system: Secondary | ICD-10-CM | POA: Insufficient documentation

## 2017-06-22 LAB — MYOCARDIAL PERFUSION IMAGING
Estimated workload: 10.3 METS
Exercise duration (min): 10 min
Exercise duration (sec): 1 s
LV dias vol: 150 mL (ref 62–150)
LV sys vol: 69 mL
MPHR: 150 {beats}/min
Peak HR: 131 {beats}/min
Percent HR: 87 %
RPE: 19
Rest HR: 53 {beats}/min
SDS: 1
SRS: 0
SSS: 1
TID: 1.03

## 2017-06-22 MED ORDER — TECHNETIUM TC 99M TETROFOSMIN IV KIT
28.5000 | PACK | Freq: Once | INTRAVENOUS | Status: AC | PRN
  Administered 2017-06-22: 28.5 via INTRAVENOUS
  Filled 2017-06-22: qty 29

## 2017-06-22 MED ORDER — TECHNETIUM TC 99M TETROFOSMIN IV KIT
9.6000 | PACK | Freq: Once | INTRAVENOUS | Status: AC | PRN
  Administered 2017-06-22: 9.6 via INTRAVENOUS
  Filled 2017-06-22: qty 10

## 2017-06-24 ENCOUNTER — Other Ambulatory Visit: Payer: Self-pay | Admitting: Cardiovascular Disease

## 2017-06-24 NOTE — Telephone Encounter (Signed)
Rx sent to pharmacy   

## 2017-08-23 ENCOUNTER — Other Ambulatory Visit: Payer: Self-pay | Admitting: Cardiovascular Disease

## 2017-09-02 ENCOUNTER — Ambulatory Visit: Payer: Self-pay | Admitting: Surgery

## 2017-09-02 NOTE — H&P (Signed)
History of Present Illness Kevin [name](Kevin [name] K. Lya Holben MD; 09/02/2017 11:27 AM) The patient is a 72 year old male who presents with an umbilical hernia. Referred by Dr. Nadyne CoombesKaren [name] for an enlarging umbilical hernia  This is a 72 year old male who presents with a two-year history of some protrusion of his umbilicus. This has become larger and more uncomfortable. He denies any obstructive symptoms. He was examined by his primary care physician who felt that he had an umbilical hernia. He is now referred for surgical evaluation.  The patient has chronic issues with reflux requiring him to sleep with the head of his bed elevated. 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 [facility]   Allergies [name](Kevin [name]; 09/02/2017 10:50 AM) No Known Drug Allergies [09/02/2017]: Allergies Reconciled  Medication History [name](Kevin [name]; 09/02/2017 10:51 AM) Testosterone (20.25 MG/ACT(1.62%) Gel, Transdermal) Active. TraMADol HCl (50MG  Tablet, Oral) Active. Chlorthalidone (25MG  Tablet, Oral) Active. HydrALAZINE HCl (50MG  Tablet, Oral) Active. HydrALAZINE HCl (100MG  Tablet, Oral) Active. Levocetirizine Dihydrochloride (5MG  Tablet, Oral) Active. Montelukast Sodium (10MG  Tablet, Oral) Active. Omeprazole (20MG  Capsule DR, Oral) Active. Pulmicort Flexhaler (180MCG/ACT Aero Pow Br Act, Inhalation) Active. RaNITidine HCl (150MG  Tablet, Oral) Active. Medications Reconciled  Social History [name](Kevin [name]; 09/02/2017 10:50 AM) Alcohol use Moderate alcohol use. Illicit drug use Remotely quit drug use. No caffeine use Tobacco use Never smoker.  Family History [name](Kevin [name]; 09/02/2017 10:50 AM) Arthritis Father, Mother, Sister. Breast Cancer Mother, Sister. Depression Sister. Hypertension Father.  Other Problems [name](Kevin [name]; 09/02/2017 10:50 AM) Arthritis Gastroesophageal Reflux Disease Inguinal Hernia     Review of Systems  [name](Kevin [name]; 09/02/2017 10:50 AM) General Not Present- Appetite Loss, Chills, Fatigue, Fever, Night Sweats, Weight Gain and Weight Loss. Skin Not Present- Change in Wart/Mole, Dryness, Hives, Jaundice, New Lesions, Non-Healing Wounds, Rash and Ulcer. HEENT Present- Seasonal Allergies and Wears glasses/contact lenses. Not Present- Earache, Hearing Loss, Hoarseness, Nose Bleed, Oral Ulcers, Ringing in the Ears, Sinus Pain, Sore Throat, Visual Disturbances and Yellow Eyes. Respiratory Not Present- Bloody sputum, Chronic Cough, Difficulty Breathing, Snoring and Wheezing. Breast Not Present- Breast Mass, Breast Pain, Nipple Discharge and Skin Changes. Cardiovascular Not Present- Chest Pain, Difficulty Breathing Lying Down, Leg Cramps, Palpitations, Rapid Heart Rate, Shortness of Breath and Swelling of Extremities. Gastrointestinal Not Present- Abdominal Pain, Bloating, Bloody Stool, Change in Bowel Habits, Chronic diarrhea, Constipation, Difficulty Swallowing, Excessive gas, Gets full quickly at meals, Hemorrhoids, Indigestion, Nausea, Rectal Pain and Vomiting. Male Genitourinary Not Present- Blood in Urine, Change in Urinary Stream, Frequency, Impotence, Nocturia, Painful Urination, Urgency and Urine Leakage. Musculoskeletal Not Present- Back Pain, Joint Pain, Joint Stiffness, Muscle Pain, Muscle Weakness and Swelling of Extremities. Neurological Not Present- Decreased Memory, Fainting, Headaches, Numbness, Seizures, Tingling, Tremor, Trouble walking and Weakness. Psychiatric Not Present- Anxiety, Bipolar, Change in Sleep Pattern, Depression, Fearful and Frequent crying. Endocrine Not Present- Cold Intolerance, Excessive Hunger, Hair Changes, Heat Intolerance, Hot flashes and New Diabetes. Hematology Not Present- Blood Thinners, Easy Bruising, Excessive bleeding, Gland problems, HIV and Persistent Infections.  Vitals [name](Kevin [name]; 09/02/2017 10:52 AM) 09/02/2017 10:51 AM Weight: 202 lb Height:  69in Body Surface Area: 2.07 m Body Mass Index: 29.83 kg/m  Temp.: 99.22F(Oral)  Pulse: 68 (Regular)  BP: 110/72 (Sitting, Left Arm, Standard)      Physical Exam Kevin [name](Kevin [name] K. Kevin Wiens MD; 09/02/2017 11:27 AM)  The physical exam findings are as follows: Note:WDWN in NAD Eyes: Pupils equal, round; sclera anicteric Voice is hoarse HENT: Oral  mucosa moist; good dentition Neck: No masses palpated, no thyromegaly Lungs: CTA bilaterally; normal respiratory effort CV: Regular rate and rhythm; no murmurs; extremities well-perfused with no edema Abd: +bowel sounds, soft, non-tender, no palpable organomegaly; protruding upper umbilical hernia - partially reducible Skin: Warm, dry; no sign of jaundice Psychiatric - alert and oriented x 4; calm mood and affect    Assessment & Plan Kevin [name](Kevin [name] K. Kevin Segler MD; 09/02/2017 11:13 AM)  UMBILICAL HERNIA WITHOUT OBSTRUCTION OR GANGRENE (K42.9)  Current Plans Schedule for Surgery - Umbilical hernia repair with mesh. The surgical procedure has been discussed with the patient. Potential risks, benefits, alternative treatments, and expected outcomes have been explained. All of the patient's questions at this time have been answered. The likelihood of reaching the patient's treatment goal is good. The patient understand the proposed surgical procedure and wishes to proceed.   Pre-op anesthesia consult because of airway/ vocal cord concerns.  Kevin ArmsMatthew K. Corliss Skainssuei, MD, [facility]  General/ Trauma Surgery  09/02/2017 11:27 AM

## 2017-10-05 ENCOUNTER — Other Ambulatory Visit: Payer: Self-pay | Admitting: Family Medicine

## 2017-10-05 DIAGNOSIS — R4189 Other symptoms and signs involving cognitive functions and awareness: Secondary | ICD-10-CM

## 2017-11-16 NOTE — Pre-Procedure Instructions (Signed)
Reakwon Barren [facility] [address]  11/16/2017      [id number] - [place], [place] - [address] [place] [address] Phone: [phone] Fax: [phone]    Your procedure is scheduled on Wednesday November 6th.  Report to [facility] Admitting at 6:30 A.M.  Call this number if you have problems the morning of surgery:  [phone]   Remember:  Do not eat after midnight.  You may drink clear liquids until 5:30am .  Clear liquids allowed are: Water, Carbonated beverages, Clear Tea, Black Coffee only and Gatorade    Take these medicines the morning of surgery with A SIP OF WATER  hydrALAZINE (APRESOLINE) montelukast (SINGULAIR) omeprazole (PRILOSEC) budesonide (PULMICORT)   Follow your surgeon's instructions on when to stop Asprin.  If no instructions were given by your surgeon then you will need to call the office to get those instructions.    7 days prior to surgery STOP taking any Aspirin(unless otherwise instructed by your surgeon), Aleve, Naproxen, Ibuprofen, Motrin, Advil, Goody's, BC's, all herbal medications, fish oil, and all vitamins   Do not wear jewelry  Do not wear lotions, powders, or colognes, or deodorant.  Do not shave 48 hours prior to surgery.  Men may shave face and neck.  Do not bring valuables to the hospital.  [facility] [place] is not responsible for any belongings or valuables.  Contacts, dentures or bridgework may not be worn into surgery.  Leave your suitcase in the car.  After surgery it may be brought to your room.  For patients admitted to the hospital, discharge time will be determined by your treatment team.  Patients discharged the day of surgery will not be allowed to drive home.   [place]- Preparing For Surgery  Before surgery, you can play an important role. Because skin is not sterile, your skin needs to be as free of germs as possible. You can reduce the number of  germs on your skin by washing with CHG (chlorahexidine gluconate) Soap before surgery.  CHG is an antiseptic cleaner which kills germs and bonds with the skin to continue killing germs even after washing.    Oral Hygiene is also important to reduce your risk of infection.  Remember - BRUSH YOUR TEETH THE MORNING OF SURGERY WITH YOUR REGULAR TOOTHPASTE  Please do not use if you have an allergy to CHG or antibacterial soaps. If your skin becomes reddened/irritated stop using the CHG.  Do not shave (including legs and underarms) for at least 48 hours prior to first CHG shower. It is OK to shave your face.  Please follow these instructions carefully.   1. Shower the NIGHT BEFORE SURGERY and the MORNING OF SURGERY with CHG.   2. If you chose to wash your hair, wash your hair first as usual with your normal shampoo.  3. After you shampoo, rinse your hair and body thoroughly to remove the shampoo.  4. Use CHG as you would any other liquid soap. You can apply CHG directly to the skin and wash gently with a scrungie or a clean washcloth.   5. Apply the CHG Soap to your body ONLY FROM THE NECK DOWN.  Do not use on open wounds or open sores. Avoid contact with your eyes, ears, mouth and genitals (private parts). Wash Face and genitals (private parts)  with your normal soap.  6. Wash thoroughly, paying special attention  to the area where your surgery will be performed.  7. Thoroughly rinse your body with warm water from the neck down.  8. DO NOT shower/wash with your normal soap after using and rinsing off the CHG Soap.  9. Pat yourself dry with a CLEAN TOWEL.  10. Wear CLEAN PAJAMAS to bed the night before surgery, wear comfortable clothes the morning of surgery  11. Place CLEAN SHEETS on your bed the night of your first shower and DO NOT SLEEP WITH PETS.    Day of Surgery: Shower as stated above. Do not apply any deodorants/lotions.  Please wear clean clothes to the hospital/surgery center.    Remember to brush your teeth WITH YOUR REGULAR TOOTHPASTE.   Please read over the following fact sheets that you were given.

## 2017-11-17 ENCOUNTER — Encounter (HOSPITAL_COMMUNITY)
Admission: RE | Admit: 2017-11-17 | Discharge: 2017-11-17 | Disposition: A | Discharge status: Home / Self Care | Payer: Medicare Other | Source: Ambulatory Visit | Attending: Surgery | Admitting: Surgery

## 2017-11-17 ENCOUNTER — Encounter (HOSPITAL_COMMUNITY): Payer: Self-pay

## 2017-11-17 ENCOUNTER — Other Ambulatory Visit: Payer: Self-pay

## 2017-11-17 DIAGNOSIS — R490 Dysphonia: Secondary | ICD-10-CM | POA: Insufficient documentation

## 2017-11-17 DIAGNOSIS — I10 Essential (primary) hypertension: Secondary | ICD-10-CM | POA: Insufficient documentation

## 2017-11-17 DIAGNOSIS — Z01818 Encounter for other preprocedural examination: Secondary | ICD-10-CM | POA: Diagnosis present

## 2017-11-17 DIAGNOSIS — K429 Umbilical hernia without obstruction or gangrene: Secondary | ICD-10-CM | POA: Insufficient documentation

## 2017-11-17 DIAGNOSIS — Z79899 Other long term (current) drug therapy: Secondary | ICD-10-CM | POA: Insufficient documentation

## 2017-11-17 DIAGNOSIS — Z7982 Long term (current) use of aspirin: Secondary | ICD-10-CM | POA: Diagnosis not present

## 2017-11-17 DIAGNOSIS — K219 Gastro-esophageal reflux disease without esophagitis: Secondary | ICD-10-CM | POA: Diagnosis not present

## 2017-11-17 HISTORY — DX: Gastro-esophageal reflux disease without esophagitis: K21.9

## 2017-11-17 HISTORY — DX: Adverse effect of unspecified anesthetic, initial encounter: T41.45XA

## 2017-11-17 HISTORY — DX: Dysphonia: R49.0

## 2017-11-17 HISTORY — DX: Other complications of anesthesia, initial encounter: T88.59XA

## 2017-11-17 LAB — CBC
HCT: 47.6 % (ref 39.0–52.0)
Hemoglobin: 15.9 g/dL (ref 13.0–17.0)
MCH: 29.8 pg (ref 26.0–34.0)
MCHC: 33.4 g/dL (ref 30.0–36.0)
MCV: 89.1 fL (ref 80.0–100.0)
Platelets: 141 10*3/uL — ABNORMAL LOW (ref 150–400)
RBC: 5.34 MIL/uL (ref 4.22–5.81)
RDW: 12.4 % (ref 11.5–15.5)
WBC: 7.1 10*3/uL (ref 4.0–10.5)
nRBC: 0 % (ref 0.0–0.2)

## 2017-11-17 LAB — BASIC METABOLIC PANEL
Anion gap: 7 (ref 5–15)
BUN: 17 mg/dL (ref 8–23)
CO2: 31 mmol/L (ref 22–32)
Calcium: 9 mg/dL (ref 8.9–10.3)
Chloride: 102 mmol/L (ref 98–111)
Creatinine, Ser: 1.1 mg/dL (ref 0.61–1.24)
GFR calc Af Amer: >60 mL/min (ref >60)
GFR calc non Af Amer: >60 mL/min (ref >60)
Glucose, Bld: 111 mg/dL — ABNORMAL HIGH (ref 70–99)
Potassium: 3.5 mmol/L (ref 3.5–5.1)
Sodium: 140 mmol/L (ref 135–145)

## 2017-11-17 NOTE — Progress Notes (Addendum)
Anesthesia PAT Evaluation:  Case:  [id number] Date/Time:  11/24/17 0815   Procedures:      UMBILICAL HERNIA REPAIR WITH MESH (N/A )     INSERTION OF MESH (N/A )   Anesthesia type:  General   Pre-op diagnosis:  UMBILICAL HERNIA   Location:  [facility] OR ROOM 02 / [place]   Surgeon:  Donnie Mesa, MD      DISCUSSION: Patient is a 72 year old male scheduled for the above procedure.   History includes never smoker, HTN, GERD, muscle tension dysphonia.   Patient discussed with Dr. Georgette Dover preference for LMA over GETA given chronic dysphonia.  Anesthesia consult was ordered. - In regards to his dysphonia, he was initially seen by ENT Matilde Sprang, MD on 09/26/13 for one year history of dysphonia. Transnasal flexible laryngoscopy showed, mild diffuse erythema, modest posterior laryngeal edema, preserved vocal fold mobility, minimal midfold atrophy, 1-2 mm glottal gap at the point of vocal process to vocal process contact, muscle tension patterns II and III allow for complete glottal closure (and are profound) (an antero-posterior and lateral "squeeze"), mucosal vibration is minimally diminished bilaterally, modestly redundant mucosa of the post-cricoid and interarytenoid region, no worrisome lesion or secretions or aspiration, normal tongue base and epiglottis, and widely patent visualized subglottis and proximal trachea.  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  Since then patient has had ST, myofascial release neck musculature, attempted acupuncture, and undergone suspension microdirect laryngoscopy with operating telescope, autologous abdominal fat graft harvest, and lipoinjection into bilateral vocal cords on 07/26/14 and EMG guided laryngeal Botulinum toxin injection 02/01/15 with little improvement. He has also seen [facility] Waunita Schooner, Aditi, MD), allergist, gastric neurology, and is on maximum  GERD therapy by GI.  Patient reports he was told it would be best to avoid GETA if possible, but acknowledges that he understands some procedures may require [name]. (His 2015 procedures have been done using LMA. He was intubated by ENT for his 07/26/14 vocal cord augmentation. Per details: 4.0 mm ETT, cuffed.  Of note per anesthesia records at that time, "Mask and intubation per ENT using Dedoscope. Pt with profound bradycardia post induction to 11 bpm with associated hypotension to 69 systolic, likely 2/2 remifentanil. Remi paused. Glyco 0.4 mg given and Ephedrine 20 mg given with good result." Last procedure was an EGD on 12/23/16 with MAC.  Will contact Dr. Georgette Dover to discuss anticipated surgical approach. If done laparoscopically will require GETA. Patient requests update once I hear back from surgeon.  ADDENDUM 11/18/17 10:59 AM:  Patient's anesthesia concerns and intraopeative airway preferences discussed with anesthesiologist Tamela Gammon, MD at the time of my initial evaluation. Patient has undergone procedures in the past with successful use of LMA. LMA is felt to be a viable option depending of surgeon's surgical approach. I heard back from Dr. Georgette Dover [name] morning. He is aware of patient's preference for LMA and is planning of open approach. I left a message for Kevin [name] (he had given me verbal permission to do so), and left my phone number if he has additional questions or wants to discuss further.       VS: BP (!) 154/56   Pulse 66   Temp 36.8 C   Resp 18   Ht 5' 9"  (1.753 m)   Wt 93.4 kg   SpO2 99%   BMI 30.39 kg/m  Heart RRR. Lungs clear. Mallampati II. He denied  chest pain, SOB, edema, dysphagia. Reports voice "not good" after surgery. Has had chronic hoarseness since ~ 2015.    PROVIDERS: Hayden Rasmussen, MD is PCP Quay Burow, MD is cardiologist. Last visit 04/27/17.  Anner Crete, MD is ENT ([place]). Last visit 10/21/17.  Tiajuana Amass, MD is  allergist Medoff, Jeffrey,MD is GI ([place]). Last visit 11/05/17.    LABS: Labs reviewed: Acceptable for surgery. (all labs ordered are listed, but only abnormal results are displayed)  Labs Reviewed  BASIC METABOLIC PANEL - Abnormal; Notable for the following components:      Result Value   Glucose, Bld 111 (*)    All other components within normal limits  CBC - Abnormal; Notable for the following components:   Platelets 141 (*)    All other components within normal limits     IMAGES: CT Chest 04/1917: FINDINGS: Vascular: Heart is normal size.  Visualized aorta is normal caliber. Mediastinum/Nodes: No adenopathy in the lower mediastinum or hila. Lungs/Pleura: Visualized lungs are clear.  No effusions. Upper Abdomen: Imaging into the upper abdomen shows no acute findings. Musculoskeletal: Chest wall soft tissues are unremarkable. No acute bony abnormality. IMPRESSION: No acute or significant extracardiac abnormality.  EKG: 11/17/17: SR with first degree AV block. LAD. LVH with QRS widening. Non-specific ST abnormality. PR interval is slightly longer, but otherwise EKG appears stable when compared to 11/17/16 tracing.   CV: Nuclear stress test 06/22/17:  The left ventricular ejection fraction is normal (55-65%).  Nuclear stress EF: 54%.  Blood pressure demonstrated a normal response to exercise.  There was no ST segment deviation noted during stress.  The study is normal.  [name] is a low risk study. Normal exercise nuclear stress test with no evidence for prior infarct or ischemia.  Normal LVEF. Excellent exercise capacity.  Normal blood pressure response to stress.  CT Cardiac Ca Scoring 05/07/17: IMPRESSION: Coronary calcium score of 537. [name] was 44 percentile for age and sex matched control.  Echo 04/26/17: Study Conclusions - Left ventricle: The cavity size was normal. Wall thickness was   increased in a pattern of mild LVH. Systolic function was  normal.   The estimated ejection fraction was in the range of 60% to 65%.   Wall motion was normal; there were no regional wall motion   abnormalities. Features are consistent with a pseudonormal left   ventricular filling pattern, with concomitant abnormal relaxation   and increased filling pressure (grade 2 diastolic dysfunction). - Aortic valve: There was moderate regurgitation. - Aortic root: The aortic root was mildly dilated. - Mitral valve: Calcified annulus. There was systolic anterior   motion of the chordal structures. - Left atrium: The atrium was mildly dilated. - Right ventricle: The cavity size was mildly dilated. Impressions: - Normal LV systolic function; moderate diastolic dysfunction; mild   LVH; moderate AI; mildly dilated aortic root; mild LAE; mild RVE;   mild TR with mild pulmonary hypertension. RVSP 30 mmHg.  Cardiac Event monitor 04/19/17-05/02/17: SR/ST/SB  Carotid U/S 03/30/14: Summary: Bilateral ICAs demonstrated normal patency without evidence of stenosis.  Bilateral vertebral arteries with normal antegrade flow.   Past Medical History:  Diagnosis Date  . Arthritis    osteoarthritis-hips/ shoulders- no issues at present  . Complication of anesthesia    longer to wake up  . Foley catheter in place 05/30/2013   PT HAD SURG 05/30/13 - CYSTO AND CLOT EVACUATION BLADDER / PROSTATE - UNABLE TO VOID AFTER DISCHARGE - CAME BACK  TO ER SAME NIGHT AND HAD FOLEY INSERTED TO DRAINAGE BAG - URINE WAS BLOODY BUT PT REPORTS ON 06/07/13 THAT URINE IN BAG MOSTLY CLEAR NOW.  Marland Kitchen GERD (gastroesophageal reflux disease)   . History of kidney stones    x1  . Hypertension   . Muscle tension dysphonia     Past Surgical History:  Procedure Laterality Date  . BACK SURGERY     lumbar   . CYSTOSCOPY W/ URETEROSCOPY W/ LITHOTRIPSY    . CYSTOSCOPY/RETROGRADE/URETEROSCOPY/STONE EXTRACTION WITH BASKET N/A 05/30/2013   Procedure: CYSTOSCOPY , BLADDER BIOPSY AND CLOT EVACUATION ,  CAUTERIZATIO OF LATERAL RIGHT AND LEFT LOBE OF PROSTATE;  Surgeon: Ailene Rud, MD;  Location: [facility];  Service: Urology;  Laterality: N/A;  . HERNIA REPAIR     inguinal  . JOINT REPLACEMENT     '00-left/'08 RTHA  . KNEE ARTHROSCOPY     left knee meniscus surgery  . PROSTATE SURGERY Bilateral    7- 8 yrs ago  . SHOULDER ARTHROSCOPY Right   . SHOULDER ARTHROTOMY Left   . TRANSURETHRAL RESECTION OF PROSTATE N/A 06/09/2013   Procedure: TRANSURETHRAL RESECTION OF THE PROSTATE (TURP) WITH GYRUS;  Surgeon: Ailene Rud, MD;  Location: [facility];  Service: Urology;  Laterality: N/A;    MEDICATIONS: . aspirin 81 MG EC tablet  . budesonide (PULMICORT) 180 MCG/ACT inhaler  . chlorthalidone (HYGROTON) 25 MG tablet  . Ginger, Zingiber officinalis, (GINGER ROOT) 550 MG CAPS  . hydrALAZINE (APRESOLINE) 50 MG tablet  . latanoprost (XALATAN) 0.005 % ophthalmic solution  . levocetirizine (XYZAL) 5 MG tablet  . Methylsulfonylmethane 1000 MG CAPS  . Misc Natural Products (TART CHERRY ADVANCED PO)  . montelukast (SINGULAIR) 10 MG tablet  . naproxen sodium (ALEVE) 220 MG tablet  . omeprazole (PRILOSEC) 20 MG capsule  . ranitidine (ZANTAC) 150 MG tablet  . TURMERIC PO   No current facility-administered medications for [name] encounter.   Holding ASA for 7 days prior to surgery.    George Hugh [facility] Short Stay Center/Anesthesiology Phone [phone] 11/17/2017 4:28 PM

## 2017-11-17 NOTE — Anesthesia Preprocedure Evaluation (Addendum)
Anesthesia Evaluation  Patient identified by MRN, date of birth, ID band Patient awake    Reviewed: Allergy & Precautions, H&P , NPO status , Patient's Chart, lab work & pertinent test results, reviewed documented beta blocker date and time   Airway Mallampati: II  TM Distance: >3 FB Neck ROM: full    Dental no notable dental hx.    Pulmonary neg pulmonary ROS,    Pulmonary exam normal breath sounds clear to auscultation       Cardiovascular Exercise Tolerance: Good hypertension, negative cardio ROS   Rhythm:regular Rate:Normal  EKG: 11/17/17: SR with first degree AV block. LAD. LVH with QRS widening. Non-specific ST abnormality. PR interval is slightly longer, but otherwise EKG appears stable when compared to 11/17/16 tracing. CV: Nuclear stress test 06/22/17:  The left ventricular ejection fraction is normal (55-65%).  Nuclear stress EF: 54%.  Blood pressure demonstrated a normal response to exercise.  There was no ST segment deviation noted during stress.  The study is normal.  This is a low risk study. Normal exercise nuclear stress test with no evidence for prior infarct or ischemia. Normal LVEF. Excellent exercise capacity. Normal blood pressure response to stress. CT Cardiac Ca Scoring 05/07/17: IMPRESSION: Coronary calcium score of 537. This was 63 percentile for age and sex matched control.  Echo 04/26/17: Study Conclusions - Left ventricle: The cavity size was normal. Wall thickness was increased in a pattern of mild LVH. Systolic function was normal. The estimated ejection fraction was in the range of 60% to 65%. Wall motion was normal; there were no regional wall motion abnormalities. Features are consistent with a pseudonormal left ventricular filling pattern, with concomitant abnormal relaxation and increased filling pressure (grade 2 diastolic dysfunction). - Aortic valve: There was  moderate regurgitation.   Neuro/Psych negative neurological ROS  negative psych ROS   GI/Hepatic negative GI ROS, Neg liver ROS,   Endo/Other  negative endocrine ROS  Renal/GU negative Renal ROS  negative genitourinary   Musculoskeletal   Abdominal   Peds  Hematology negative hematology ROS (+)   Anesthesia Other Findings   Reproductive/Obstetrics negative OB ROS                            Anesthesia Physical Anesthesia Plan  ASA: III  Anesthesia Plan: General   Post-op Pain Management:    Induction:   PONV Risk Score and Plan: 2 and Ondansetron, Treatment may vary due to age or medical condition and Dexamethasone  Airway Management Planned: LMA  Additional Equipment:   Intra-op Plan:   Post-operative Plan: Extubation in OR  Informed Consent: I have reviewed the patients History and Physical, chart, labs and discussed the procedure including the risks, benefits and alternatives for the proposed anesthesia with the patient or authorized representative who has indicated his/her understanding and acceptance.   Dental Advisory Given  Plan Discussed with: CRNA, Anesthesiologist and Surgeon  Anesthesia Plan Comments: (See PAT note written 11/17/2017 by Shonna Chock, PA-C. See airway concerns and preference for LMA (history of muscle tension dysphonia). Per Dr. Corliss Skains, "I will do it open. It's ok to do this with a LMA." )    Anesthesia Quick Evaluation

## 2017-11-24 ENCOUNTER — Encounter (HOSPITAL_COMMUNITY)
Admission: RE | Disposition: A | Discharge status: Home / Self Care | Payer: Self-pay | Source: Ambulatory Visit | Attending: Surgery

## 2017-11-24 ENCOUNTER — Ambulatory Visit (HOSPITAL_COMMUNITY): Payer: Medicare Other | Admitting: Certified Registered Nurse Anesthetist

## 2017-11-24 ENCOUNTER — Ambulatory Visit (HOSPITAL_COMMUNITY)
Admission: RE | Admit: 2017-11-24 | Discharge: 2017-11-24 | Disposition: A | Discharge status: Home / Self Care | Payer: Medicare Other | Source: Ambulatory Visit | Attending: Surgery | Admitting: Surgery

## 2017-11-24 ENCOUNTER — Ambulatory Visit (HOSPITAL_COMMUNITY): Payer: Medicare Other | Admitting: Vascular Surgery

## 2017-11-24 ENCOUNTER — Encounter (HOSPITAL_COMMUNITY): Payer: Self-pay | Admitting: *Deleted

## 2017-11-24 DIAGNOSIS — I440 Atrioventricular block, first degree: Secondary | ICD-10-CM | POA: Diagnosis not present

## 2017-11-24 DIAGNOSIS — M1990 Unspecified osteoarthritis, unspecified site: Secondary | ICD-10-CM | POA: Diagnosis not present

## 2017-11-24 DIAGNOSIS — Z8249 Family history of ischemic heart disease and other diseases of the circulatory system: Secondary | ICD-10-CM | POA: Diagnosis not present

## 2017-11-24 DIAGNOSIS — Z818 Family history of other mental and behavioral disorders: Secondary | ICD-10-CM | POA: Insufficient documentation

## 2017-11-24 DIAGNOSIS — K219 Gastro-esophageal reflux disease without esophagitis: Secondary | ICD-10-CM | POA: Diagnosis not present

## 2017-11-24 DIAGNOSIS — Z803 Family history of malignant neoplasm of breast: Secondary | ICD-10-CM | POA: Insufficient documentation

## 2017-11-24 DIAGNOSIS — Z79899 Other long term (current) drug therapy: Secondary | ICD-10-CM | POA: Insufficient documentation

## 2017-11-24 DIAGNOSIS — I351 Nonrheumatic aortic (valve) insufficiency: Secondary | ICD-10-CM | POA: Diagnosis not present

## 2017-11-24 DIAGNOSIS — K429 Umbilical hernia without obstruction or gangrene: Secondary | ICD-10-CM | POA: Diagnosis not present

## 2017-11-24 DIAGNOSIS — Z8261 Family history of arthritis: Secondary | ICD-10-CM | POA: Insufficient documentation

## 2017-11-24 HISTORY — PX: UMBILICAL HERNIA REPAIR: SHX196

## 2017-11-24 HISTORY — PX: INSERTION OF MESH: SHX5868

## 2017-11-24 SURGERY — REPAIR, HERNIA, UMBILICAL, ADULT
Anesthesia: General | Site: Abdomen

## 2017-11-24 MED ORDER — ONDANSETRON HCL 4 MG/2ML IJ SOLN
INTRAMUSCULAR | Status: DC | PRN
  Administered 2017-11-24: 4 mg via INTRAVENOUS

## 2017-11-24 MED ORDER — KETOROLAC TROMETHAMINE 30 MG/ML IJ SOLN
INTRAMUSCULAR | Status: DC | PRN
  Administered 2017-11-24: 30 mg via INTRAVENOUS

## 2017-11-24 MED ORDER — OXYCODONE HCL 5 MG PO TABS: 5.0000 mg | ORAL_TABLET | Freq: Four times a day (QID) | ORAL | 0 refills | Status: DC | PRN

## 2017-11-24 MED ORDER — PROPOFOL 10 MG/ML IV BOLUS
INTRAVENOUS | Status: AC
  Filled 2017-11-24: qty 20

## 2017-11-24 MED ORDER — FENTANYL CITRATE (PF) 250 MCG/5ML IJ SOLN
INTRAMUSCULAR | Status: DC | PRN
  Administered 2017-11-24: 50 ug via INTRAVENOUS

## 2017-11-24 MED ORDER — ONDANSETRON HCL 4 MG/2ML IJ SOLN
INTRAMUSCULAR | Status: AC
  Filled 2017-11-24: qty 2

## 2017-11-24 MED ORDER — DEXAMETHASONE SODIUM PHOSPHATE 10 MG/ML IJ SOLN
INTRAMUSCULAR | Status: AC
  Filled 2017-11-24: qty 1

## 2017-11-24 MED ORDER — GABAPENTIN 300 MG PO CAPS
ORAL_CAPSULE | ORAL | Status: AC
  Filled 2017-11-24: qty 1

## 2017-11-24 MED ORDER — BUPIVACAINE-EPINEPHRINE (PF) 0.25% -1:200000 IJ SOLN
INTRAMUSCULAR | Status: AC
  Filled 2017-11-24: qty 30

## 2017-11-24 MED ORDER — LIDOCAINE 2% (20 MG/ML) 5 ML SYRINGE
INTRAMUSCULAR | Status: AC
  Filled 2017-11-24: qty 5

## 2017-11-24 MED ORDER — OXYCODONE HCL 5 MG PO TABS
ORAL_TABLET | ORAL | Status: AC
  Filled 2017-11-24: qty 1

## 2017-11-24 MED ORDER — FENTANYL CITRATE (PF) 250 MCG/5ML IJ SOLN
INTRAMUSCULAR | Status: AC
  Filled 2017-11-24: qty 5

## 2017-11-24 MED ORDER — CELECOXIB 200 MG PO CAPS
ORAL_CAPSULE | ORAL | Status: AC
  Administered 2017-11-24: 200 mg
  Filled 2017-11-24: qty 1

## 2017-11-24 MED ORDER — PROPOFOL 10 MG/ML IV BOLUS
INTRAVENOUS | Status: DC | PRN
  Administered 2017-11-24: 150 mg via INTRAVENOUS

## 2017-11-24 MED ORDER — CHLORHEXIDINE GLUCONATE CLOTH 2 % EX PADS: 6.0000 | MEDICATED_PAD | Freq: Once | CUTANEOUS | Status: DC

## 2017-11-24 MED ORDER — BUPIVACAINE-EPINEPHRINE 0.25% -1:200000 IJ SOLN
INTRAMUSCULAR | Status: DC | PRN
  Administered 2017-11-24: 10 mL

## 2017-11-24 MED ORDER — CELECOXIB 200 MG PO CAPS: 200.0000 mg | ORAL_CAPSULE | ORAL | Status: DC

## 2017-11-24 MED ORDER — 0.9 % SODIUM CHLORIDE (POUR BTL) OPTIME
TOPICAL | Status: DC | PRN
  Administered 2017-11-24: 1000 mL

## 2017-11-24 MED ORDER — ACETAMINOPHEN 500 MG PO TABS
ORAL_TABLET | ORAL | Status: AC
  Administered 2017-11-24: 1000 mg
  Filled 2017-11-24: qty 2

## 2017-11-24 MED ORDER — DEXAMETHASONE SODIUM PHOSPHATE 10 MG/ML IJ SOLN
INTRAMUSCULAR | Status: DC | PRN
  Administered 2017-11-24: 10 mg via INTRAVENOUS

## 2017-11-24 MED ORDER — EPHEDRINE SULFATE 50 MG/ML IJ SOLN
INTRAMUSCULAR | Status: DC | PRN
  Administered 2017-11-24: 5 mg via INTRAVENOUS

## 2017-11-24 MED ORDER — LACTATED RINGERS IV SOLN
INTRAVENOUS | Status: DC | PRN
  Administered 2017-11-24: 08:00:00 via INTRAVENOUS

## 2017-11-24 MED ORDER — CEFAZOLIN SODIUM-DEXTROSE 2-4 GM/100ML-% IV SOLN
2.0000 g | INTRAVENOUS | Status: AC
  Administered 2017-11-24: 2 g via INTRAVENOUS

## 2017-11-24 MED ORDER — ACETAMINOPHEN 500 MG PO TABS: 1000.0000 mg | ORAL_TABLET | ORAL | Status: DC

## 2017-11-24 MED ORDER — LIDOCAINE 2% (20 MG/ML) 5 ML SYRINGE
INTRAMUSCULAR | Status: DC | PRN
  Administered 2017-11-24: 100 mg via INTRAVENOUS

## 2017-11-24 MED ORDER — GABAPENTIN 300 MG PO CAPS
300.0000 mg | ORAL_CAPSULE | ORAL | Status: AC
  Administered 2017-11-24: 300 mg via ORAL

## 2017-11-24 MED ORDER — FENTANYL CITRATE (PF) 100 MCG/2ML IJ SOLN: 25.0000 ug | INTRAMUSCULAR | Status: DC | PRN

## 2017-11-24 MED ORDER — MEPERIDINE HCL 50 MG/ML IJ SOLN: 6.2500 mg | INTRAMUSCULAR | Status: DC | PRN

## 2017-11-24 MED ORDER — CEFAZOLIN SODIUM-DEXTROSE 2-4 GM/100ML-% IV SOLN
INTRAVENOUS | Status: AC
  Filled 2017-11-24: qty 100

## 2017-11-24 MED ORDER — OXYCODONE HCL 5 MG PO TABS
5.0000 mg | ORAL_TABLET | Freq: Once | ORAL | Status: AC
  Administered 2017-11-24: 5 mg via ORAL

## 2017-11-24 SURGICAL SUPPLY — 48 items
APL SKNCLS STERI-STRIP NONHPOA (GAUZE/BANDAGES/DRESSINGS) ×1
BENZOIN TINCTURE PRP APPL 2/3 (GAUZE/BANDAGES/DRESSINGS) ×3 IMPLANT
BINDER ABDOMINAL 12 ML 46-62 (SOFTGOODS) ×2 IMPLANT
BLADE CLIPPER SURG (BLADE) ×2 IMPLANT
BLADE SURG 15 STRL LF DISP TIS (BLADE) ×1 IMPLANT
BLADE SURG 15 STRL SS (BLADE) ×3
CHLORAPREP W/TINT 26ML (MISCELLANEOUS) ×3 IMPLANT
CLOSURE WOUND 1/2 X4 (GAUZE/BANDAGES/DRESSINGS) ×1
COVER SURGICAL LIGHT HANDLE (MISCELLANEOUS) ×3 IMPLANT
COVER WAND RF STERILE (DRAPES) ×3 IMPLANT
DRAPE LAPAROTOMY 100X72 PEDS (DRAPES) ×3 IMPLANT
DRAPE UTILITY XL STRL (DRAPES) ×3 IMPLANT
DRSG TEGADERM 4X4.75 (GAUZE/BANDAGES/DRESSINGS) ×3 IMPLANT
ELECT CAUTERY BLADE 6.4 (BLADE) ×3 IMPLANT
ELECT REM PT RETURN 9FT ADLT (ELECTROSURGICAL) ×3
ELECTRODE REM PT RTRN 9FT ADLT (ELECTROSURGICAL) ×1 IMPLANT
GAUZE 4X4 16PLY RFD (DISPOSABLE) ×3 IMPLANT
GAUZE SPONGE 2X2 8PLY NS (GAUZE/BANDAGES/DRESSINGS) ×2 IMPLANT
GAUZE SPONGE 2X2 8PLY STRL LF (GAUZE/BANDAGES/DRESSINGS) ×1 IMPLANT
GLOVE BIO SURGEON STRL SZ7 (GLOVE) ×3 IMPLANT
GLOVE BIOGEL PI IND STRL 7.5 (GLOVE) ×1 IMPLANT
GLOVE BIOGEL PI INDICATOR 7.5 (GLOVE) ×4
GLOVE ECLIPSE 7.5 STRL STRAW (GLOVE) ×2 IMPLANT
GOWN STRL REUS W/ TWL LRG LVL3 (GOWN DISPOSABLE) ×2 IMPLANT
GOWN STRL REUS W/TWL LRG LVL3 (GOWN DISPOSABLE) ×6
KIT BASIN OR (CUSTOM PROCEDURE TRAY) ×3 IMPLANT
KIT TURNOVER KIT B (KITS) ×3 IMPLANT
MESH VENTRALEX ST 1-7/10 CRC S (Mesh General) ×2 IMPLANT
NDL HYPO 25GX1X1/2 BEV (NEEDLE) ×1 IMPLANT
NEEDLE HYPO 25GX1X1/2 BEV (NEEDLE) ×3 IMPLANT
NS IRRIG 1000ML POUR BTL (IV SOLUTION) ×3 IMPLANT
PACK SURGICAL SETUP 50X90 (CUSTOM PROCEDURE TRAY) ×3 IMPLANT
PAD ARMBOARD 7.5X6 YLW CONV (MISCELLANEOUS) ×3 IMPLANT
PENCIL BUTTON HOLSTER BLD 10FT (ELECTRODE) ×3 IMPLANT
SPONGE GAUZE 2X2 STER 10/PKG (GAUZE/BANDAGES/DRESSINGS) ×2
STRIP CLOSURE SKIN 1/2X4 (GAUZE/BANDAGES/DRESSINGS) ×2 IMPLANT
SUT MNCRL AB 4-0 PS2 18 (SUTURE) ×3 IMPLANT
SUT NOVA NAB DX-16 0-1 5-0 T12 (SUTURE) ×3 IMPLANT
SUT NOVA NAB GS-21 0 18 T12 DT (SUTURE) ×3 IMPLANT
SUT VIC AB 3-0 SH 27 (SUTURE) ×3
SUT VIC AB 3-0 SH 27X BRD (SUTURE) ×1 IMPLANT
SYR BULB 3OZ (MISCELLANEOUS) ×3 IMPLANT
SYR CONTROL 10ML LL (SYRINGE) ×3 IMPLANT
TOWEL OR 17X24 6PK STRL BLUE (TOWEL DISPOSABLE) ×3 IMPLANT
TOWEL OR 17X26 10 PK STRL BLUE (TOWEL DISPOSABLE) ×3 IMPLANT
TUBE CONNECTING 12'X1/4 (SUCTIONS) ×1
TUBE CONNECTING 12X1/4 (SUCTIONS) ×1 IMPLANT
YANKAUER SUCT BULB TIP NO VENT (SUCTIONS) ×2 IMPLANT

## 2017-11-24 NOTE — Discharge Instructions (Signed)
[facility] _______[facility] ° °UMBILICAL  HERNIA REPAIR: POST OP INSTRUCTIONS ° °Always review your discharge instruction sheet given to you by the facility where your surgery was performed. °IF YOU HAVE DISABILITY OR FAMILY LEAVE FORMS, YOU MUST BRING THEM TO THE OFFICE FOR PROCESSING.   °DO NOT GIVE THEM TO YOUR DOCTOR. ° °1. A  prescription for pain medication may be given to you upon discharge.  Take your pain medication as prescribed, if needed.  If narcotic pain medicine is not needed, then you may take acetaminophen (Tylenol) or ibuprofen (Advil) as needed. °2. Take your usually prescribed medications unless otherwise directed. °If you need a refill on your pain medication, please contact your pharmacy.  They will contact our office to request authorization. Prescriptions will not be filled after 5 pm or on week-ends. °3. You should follow a light diet the first 24 hours after arrival home, such as soup and crackers, etc.  Be sure to include lots of fluids daily.  Resume your normal diet the day after surgery. °4.Most patients will experience some swelling and bruising around the umbilicus or in the groin and scrotum.  Ice packs and reclining will help.  Swelling and bruising can take several days to resolve.  °6. It is common to experience some constipation if taking pain medication after surgery.  Increasing fluid intake and taking a stool softener (such as Colace) will usually help or prevent this problem from occurring.  A mild laxative (Milk of Magnesia or Miralax) should be taken according to package directions if there are no bowel movements after 48 hours. °7. Unless discharge instructions indicate otherwise, you may remove your bandages 24-48 hours after surgery, and you may shower at that time.  You may have steri-strips (small skin tapes) in place directly over the incision.  These strips should be left on the skin for 7-10 days.  If your surgeon used skin glue on the incision, you may  shower in 24 hours.  The glue will flake off over the next 2-3 weeks.  Any sutures or staples will be removed at the office during your follow-up visit. °8. ACTIVITIES:  You may resume regular (light) daily activities beginning the next day--such as daily self-care, walking, climbing stairs--gradually increasing activities as tolerated.  You may have sexual intercourse when it is comfortable.  Refrain from any heavy lifting or straining until approved by your doctor. ° °a.You may drive when you are no longer taking prescription pain medication, you can comfortably wear a seatbelt, and you can safely maneuver your car and apply brakes. °b.RETURN TO WORK:   °_____________________________________________ ° °9.You should see your doctor in the office for a follow-up appointment approximately 2-3 weeks after your surgery.  Make sure that you call for this appointment within a day or two after you arrive home to insure a convenient appointment time. °10.OTHER INSTRUCTIONS: _________________________ °   _____________________________________ ° °WHEN TO CALL YOUR DOCTOR: °1. Fever over 101.0 °2. Inability to urinate °3. Nausea and/or vomiting °4. Extreme swelling or bruising °5. Continued bleeding from incision. °6. Increased pain, redness, or drainage from the incision ° °The clinic staff is available to answer your questions during regular business hours.  Please don’t hesitate to call and ask to speak to one of the nurses for clinical concerns.  If you have a medical emergency, go to the nearest emergency room or call 911.  A surgeon from [facility] is always on call at the hospital ° ° °[address], [address], [place], [place]  [address] ? ° [address] [address], [place], [place]   [address] °[phone] ? [phone] ? FAX [phone] °Web site: [email or web address] ° °

## 2017-11-24 NOTE — H&P (Signed)
History of Present Illness  The patient is a 72 year old male who presents with an umbilical hernia. Referred by Dr. Nadyne Coombes for an enlarging umbilical hernia  This is a 72 year old male who presents with a two-year history of some protrusion of his umbilicus. This has become larger and more uncomfortable. He denies any obstructive symptoms. He was examined by his primary care physician who felt that he had an umbilical hernia. He is now referred for surgical evaluation.  The patient has chronic issues with reflux requiring him to sleep with the head of his bed elevated. 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 He is followed at the voice Center at [facility].   Allergies  No Known Drug Allergies  Allergies Reconciled  Medication History  Testosterone (20.25 MG/ACT(1.62%) Gel, Transdermal) Active. TraMADol HCl (50MG  Tablet, Oral) Active. Chlorthalidone (25MG  Tablet, Oral) Active. HydrALAZINE HCl (50MG  Tablet, Oral) Active. HydrALAZINE HCl (100MG  Tablet, Oral) Active. Levocetirizine Dihydrochloride (5MG  Tablet, Oral) Active. Montelukast Sodium (10MG  Tablet, Oral) Active. Omeprazole (20MG  Capsule DR, Oral) Active. Pulmicort Flexhaler (180MCG/ACT Aero Pow Br Act, Inhalation) Active. RaNITidine HCl (150MG  Tablet, Oral) Active. Medications Reconciled  Social History Alcohol use Moderate alcohol use. Illicit drug use Remotely quit drug use. No caffeine use Tobacco use Never smoker.  Family History  Arthritis Father, Mother, Sister. Breast Cancer Mother, Sister. Depression Sister. Hypertension Father.  Other Problems  Arthritis Gastroesophageal Reflux Disease Inguinal Hernia     Review of Systems  General Not Present- Appetite Loss, Chills, Fatigue, Fever, Night Sweats, Weight Gain and Weight Loss. Skin Not Present- Change in Wart/Mole, Dryness, Hives, Jaundice, New Lesions, Non-Healing Wounds, Rash and  Ulcer. HEENT Present- Seasonal Allergies and Wears glasses/contact lenses. Not Present- Earache, Hearing Loss, Hoarseness, Nose Bleed, Oral Ulcers, Ringing in the Ears, Sinus Pain, Sore Throat, Visual Disturbances and Yellow Eyes. Respiratory Not Present- Bloody sputum, Chronic Cough, Difficulty Breathing, Snoring and Wheezing. Breast Not Present- Breast Mass, Breast Pain, Nipple Discharge and Skin Changes. Cardiovascular Not Present- Chest Pain, Difficulty Breathing Lying Down, Leg Cramps, Palpitations, Rapid Heart Rate, Shortness of Breath and Swelling of Extremities. Gastrointestinal Not Present- Abdominal Pain, Bloating, Bloody Stool, Change in Bowel Habits, Chronic diarrhea, Constipation, Difficulty Swallowing, Excessive gas, Gets full quickly at meals, Hemorrhoids, Indigestion, Nausea, Rectal Pain and Vomiting. Male Genitourinary Not Present- Blood in Urine, Change in Urinary Stream, Frequency, Impotence, Nocturia, Painful Urination, Urgency and Urine Leakage. Musculoskeletal Not Present- Back Pain, Joint Pain, Joint Stiffness, Muscle Pain, Muscle Weakness and Swelling of Extremities. Neurological Not Present- Decreased Memory, Fainting, Headaches, Numbness, Seizures, Tingling, Tremor, Trouble walking and Weakness. Psychiatric Not Present- Anxiety, Bipolar, Change in Sleep Pattern, Depression, Fearful and Frequent crying. Endocrine Not Present- Cold Intolerance, Excessive Hunger, Hair Changes, Heat Intolerance, Hot flashes and New Diabetes. Hematology Not Present- Blood Thinners, Easy Bruising, Excessive bleeding, Gland problems, HIV and Persistent Infections.  Vitals  Weight: 202 lb Height: 69in Body Surface Area: 2.07 m Body Mass Index: 29.83 kg/m  Temp.: 99.67F(Oral)  Pulse: 68 (Regular)  BP: 110/72 (Sitting, Left Arm, Standard)      Physical Exam   The physical exam findings are as follows: Note:WDWN in NAD Eyes: Pupils equal, round; sclera anicteric Voice  is hoarse HENT: Oral mucosa moist; good dentition Neck: No masses palpated, no thyromegaly Lungs: CTA bilaterally; normal respiratory effort CV: Regular rate and rhythm; no murmurs; extremities well-perfused with no edema Abd: +bowel sounds, soft, non-tender, no palpable organomegaly; protruding upper umbilical hernia - partially reducible Skin:  Warm, dry; no sign of jaundice Psychiatric - alert and oriented x 4; calm mood and affect    Assessment & Plan   UMBILICAL HERNIA WITHOUT OBSTRUCTION OR GANGRENE (K42.9)  Current Plans Schedule for Surgery - Umbilical hernia repair with mesh. The surgical procedure has been discussed with the patient. Potential risks, benefits, alternative treatments, and expected outcomes have been explained. All of the patient's questions at this time have been answered. The likelihood of reaching the patient's treatment goal is good. The patient understand the proposed surgical procedure and wishes to proceed.  Wilmon Arms. Corliss Skains, MD, [facility]  General/ Trauma Surgery Beeper [phone]  11/24/2017 8:11 AM

## 2017-11-24 NOTE — Op Note (Signed)
Indications:  The patient presented with a history of an enlarging umbilical hernia.  The patient was examined and we recommended umbilical hernia repair with mesh.  Pre-operative diagnosis:  Umbilical hernia  Post-operative diagnosis:  Same 1.5 cm defect  Procedure:  Umbilical hernia repair with mesh  Procedure Details  The patient was seen again in the Holding Room. The risks, benefits, complications, treatment options, and expected outcomes were discussed with the patient. The possibilities of reaction to medication, pulmonary aspiration, perforation of viscus, bleeding, recurrent infection, the need for additional procedures, and development of a complication requiring transfusion or further operation were discussed with the patient and/or family. There was concurrence with the proposed plan, and informed consent was obtained. The site of surgery was properly noted/marked. The patient was taken to the Operating Room, identified as [name], and the procedure verified as umbilical hernia repair. A Time Out was held and the above information confirmed.  After an adequate level of general anesthesia was obtained, the patient's abdomen was prepped with Chloraprep and draped in sterile fashion.  We made a transverse incision above the umbilicus.  Dissection was carried down to the hernia sac with cautery.  We dissected bluntly around the hernia sac down to the edge of the fascial defect.  We reduced the hernia sac back into the pre-peritoneal space.  The fascial defect measured 1.5 cm.  We cleared the fascia in all directions.  A small Ventralex mesh was inserted into the pre-peritoneal space and was deployed.  The mesh was secured with four trans- fascial sutures of 0 Novofil.  The fascial defect was closed with multiple interrupted figure-of-eight 1 Novofil sutures.  The base of the umbilicus was tacked down with 3-0 Vicryl.  3-0 Vicryl was used to close the subcutaneous tissues and 4-0 Monocryl  was used to close the skin.  Steri-strips and clean dressing were applied.  The patient was extubated and brought to the recovery room in stable condition.  All sponge, instrument, and needle counts were correct prior to closure and at the conclusion of the case.   Estimated Blood Loss: Minimal          Complications: None; patient tolerated the procedure well.         Disposition: PACU - hemodynamically stable.         Condition: stable  Kevin [name]. Corliss Skains, MD, [facility]  General/ Trauma Surgery Beeper [phone]  11/24/2017 9:10 AM

## 2017-11-24 NOTE — Anesthesia Procedure Notes (Signed)
Procedure Name: LMA Insertion Date/Time: 11/24/2017 8:37 AM Performed by: Alvera Novel, CRNA Pre-anesthesia Checklist: Patient identified, Emergency Drugs available, Suction available and Patient being monitored Patient Re-evaluated:Patient Re-evaluated prior to induction Oxygen Delivery Method: Circle System Utilized Preoxygenation: Pre-oxygenation with 100% oxygen Induction Type: IV induction Ventilation: Mask ventilation without difficulty LMA: LMA inserted LMA Size: 5.0 Number of attempts: 1 Placement Confirmation: positive ETCO2 Tube secured with: Tape Dental Injury: Teeth and Oropharynx as per pre-operative assessment

## 2017-11-24 NOTE — Progress Notes (Signed)
Per Dr. Corliss Skains, pt does not have to void in order to meet criteria to be discharged home.

## 2017-11-24 NOTE — Anesthesia Postprocedure Evaluation (Signed)
Anesthesia Post Note  Patient: TERIUS JACUINDE  Procedure(s) Performed: UMBILICAL HERNIA REPAIR WITH MESH (N/A Abdomen) INSERTION OF MESH (N/A Abdomen)     Patient location during evaluation: PACU Anesthesia Type: General Level of consciousness: awake and alert Pain management: pain level controlled Vital Signs Assessment: post-procedure vital signs reviewed and stable Respiratory status: spontaneous breathing, nonlabored ventilation, respiratory function stable and patient connected to nasal cannula oxygen Cardiovascular status: blood pressure returned to baseline and stable Postop Assessment: no apparent nausea or vomiting Anesthetic complications: no    Last Vitals:  Vitals:   11/24/17 0952 11/24/17 1006  BP: (!) 156/69 (!) 161/62  Pulse: 60 (!) 54  Resp: 13 14  Temp: (!) 36.4 C   SpO2: 99% 98%    Last Pain:  Vitals:   11/24/17 1006  TempSrc:   PainSc: 2                  Trinidad Petron

## 2017-11-24 NOTE — Transfer of Care (Signed)
Immediate Anesthesia Transfer of Care Note  Patient: Kevin [name]  Procedure(s) Performed: UMBILICAL HERNIA REPAIR WITH MESH (N/A Abdomen) INSERTION OF MESH (N/A Abdomen)  Patient Location: PACU  Anesthesia Type:General  Level of Consciousness: drowsy  Airway & Oxygen Therapy: Patient Spontanous Breathing and Patient connected to face mask oxygen  Post-op Assessment: Report given to RN and Post -op Vital signs reviewed and stable  Post vital signs: Reviewed and stable  Last Vitals:  Vitals Value Taken Time  BP 142/61 11/24/2017  9:22 AM  Temp    Pulse 60 11/24/2017  9:23 AM  Resp 15 11/24/2017  9:23 AM  SpO2 99 % 11/24/2017  9:23 AM  Vitals shown include unvalidated device data.  Last Pain:  Vitals:   11/24/17 0712  TempSrc:   PainSc: 0-No pain         Complications: No apparent anesthesia complications

## 2017-11-25 ENCOUNTER — Encounter (HOSPITAL_COMMUNITY): Payer: Self-pay | Admitting: Surgery

## 2017-11-29 ENCOUNTER — Other Ambulatory Visit: Payer: Self-pay | Admitting: Cardiovascular Disease

## 2017-12-17 ENCOUNTER — Other Ambulatory Visit: Payer: Self-pay | Admitting: Cardiovascular Disease

## 2017-12-18 ENCOUNTER — Other Ambulatory Visit: Payer: Self-pay | Admitting: Cardiovascular Disease

## 2017-12-20 NOTE — Telephone Encounter (Signed)
Rx(s) sent to pharmacy electronically.  

## 2017-12-21 ENCOUNTER — Other Ambulatory Visit: Payer: Self-pay | Admitting: Cardiovascular Disease

## 2017-12-21 NOTE — Telephone Encounter (Signed)
° ° °  [facility] calling to request new prescription for chlorthalidone (HYGROTON) 25 MG tablet. Received on 12/20/17 however system denied stating; refill too soon. Please resend.

## 2017-12-22 MED ORDER — CHLORTHALIDONE 25 MG PO TABS: 25.0000 mg | ORAL_TABLET | Freq: Every day | ORAL | 3 refills | Status: DC

## 2017-12-22 NOTE — Telephone Encounter (Signed)
Rx resent to pharmacy

## 2018-02-25 ENCOUNTER — Other Ambulatory Visit: Payer: Self-pay | Admitting: Cardiovascular Disease

## 2018-03-01 ENCOUNTER — Other Ambulatory Visit: Payer: Self-pay

## 2018-03-01 MED ORDER — HYDRALAZINE HCL 50 MG PO TABS: 50.0000 mg | ORAL_TABLET | Freq: Two times a day (BID) | ORAL | 0 refills | Status: DC

## 2018-03-28 ENCOUNTER — Other Ambulatory Visit: Payer: Self-pay | Admitting: Cardiovascular Disease

## 2018-03-29 ENCOUNTER — Other Ambulatory Visit: Payer: Self-pay | Admitting: Cardiovascular Disease

## 2018-03-29 NOTE — Telephone Encounter (Signed)
Rx(s) sent to pharmacy electronically.  

## 2018-05-02 ENCOUNTER — Other Ambulatory Visit: Payer: Self-pay

## 2018-05-02 MED ORDER — HYDRALAZINE HCL 100 MG PO TABS: ORAL_TABLET | ORAL | 4 refills | Status: DC

## 2018-05-02 NOTE — Telephone Encounter (Signed)
Refilled pt hydralazine 100 mg per Dr. Allyson Sabal phone request. pt aware that refill available at pharm on file

## 2018-05-29 ENCOUNTER — Other Ambulatory Visit: Payer: Self-pay | Admitting: Cardiovascular Disease

## 2018-06-02 ENCOUNTER — Telehealth: Payer: Self-pay | Admitting: Cardiovascular Disease

## 2018-06-02 NOTE — Telephone Encounter (Signed)
Mychart sent via text, smartphone, pre reg complete 06/02/18 AF

## 2018-06-03 ENCOUNTER — Ambulatory Visit (INDEPENDENT_AMBULATORY_CARE_PROVIDER_SITE_OTHER): Payer: Medicare Other | Admitting: *Deleted

## 2018-06-03 ENCOUNTER — Telehealth (INDEPENDENT_AMBULATORY_CARE_PROVIDER_SITE_OTHER): Payer: Medicare Other | Admitting: Cardiovascular Disease

## 2018-06-03 ENCOUNTER — Other Ambulatory Visit: Payer: Self-pay

## 2018-06-03 ENCOUNTER — Telehealth: Payer: Self-pay

## 2018-06-03 VITALS — BP 140/55 | HR 54 | Ht 69.0 in | Wt 192.0 lb

## 2018-06-03 DIAGNOSIS — I10 Essential (primary) hypertension: Secondary | ICD-10-CM | POA: Diagnosis not present

## 2018-06-03 DIAGNOSIS — R001 Bradycardia, unspecified: Secondary | ICD-10-CM | POA: Diagnosis not present

## 2018-06-03 NOTE — Progress Notes (Signed)
Virtual Visit via Telephone Note   This visit type was conducted due to national recommendations for restrictions regarding the COVID-19 Pandemic (e.g. social distancing) in an effort to limit this patient's exposure and mitigate transmission in our community.  Due to his co-morbid illnesses, this patient is at least at moderate risk for complications without adequate follow up.  This format is felt to be most appropriate for this patient at this time.  The patient did not have access to video technology/had technical difficulties with video requiring transitioning to audio format only (telephone).  All issues noted in this document were discussed and addressed.  No physical exam could be performed with this format.  Please refer to the patient's chart for his  consent to telehealth for [facility].   Date:  06/03/2018   ID:  Kevin [name], DOB 1945-10-08, MRN [id number]  Patient Location: Home Provider Location: Home  PCP:  Dois Davenportichter, Karen L, MD  Cardiologist: Dr. Nanetta BattyJonathan  Electrophysiologist:  None   Evaluation Performed:  Follow-Up Visit  Chief Complaint: Dizziness and anxiety  History of Present Illness:    Kevin ComberRobert E [name] is a 73 y.o.  mildly overweight married Caucasian male who works at [employer]. I last saw him in the office  04/27/2017.He has a history of hypertension on ACE inhibitor as in the past. I last saw him in the office 09/26/14. His history otherwise is remarkable for multiple joint replacements as well as prostate resection because of prostate surgery, with ureteral complications. His ACE inhibitor was recently changed to an ARB (losartan) because of a hoarse voice. He probably needs vocal cord surgery next week. He was [facility] today for preoperative evaluation with blood pressure was measured multiple times in the 190/90 range. Since I saw him in the office we have been titrating his blood pressure medicines to the point now that his blood  pressure is much better controlled in the 130/6070 range. He currently is on hydralazine, Benicar andamlodipine. He denies chest pain or shortness of breath. He did have some vague ocular phenomenon for which he saw his ophthalmologist. 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 I referred him to Dr. Pearlean BrownieSethi for neurologic evaluation. An MRI/MRA was unremarkable. His blood pressure has been under much better control on his current medical regimen. He follows his blood pressure as an outpatient at home. He has had issues with morning mucus production and coughing/gagging for the last 12 months or so. This has been evaluated by ENT, gastric neurology and allergy. There has been no uniform consensus on the etiology.  This mucus production has somewhat improved over last several months. He is otherwise asymptomatic with excellent blood pressure measurements at home on minimal medications.  Since I saw him a year ago he is done fairly well.  His blood pressures have been under excellent control.  He is done a lot of international traveling.  Unfortunately, there have been some family dynamic issues which has created a lot of anxiety recently.  He complains of constant dizziness and cognitive changes.  He denies chest pain or shortness of breath.  He did have a coronary calcium score 05/07/2017 which was 537 and a subsequent Myoview 06/22/2017 which was entirely normal.  He is fairly active and when outside walking and hiking he has no symptoms.  The patient does not have symptoms concerning for COVID-19 infection (fever, chills, cough, or new shortness of breath).    Past Medical History:  Diagnosis  Date  . Arthritis    osteoarthritis-hips/ shoulders- no issues at present  . Complication of anesthesia    longer to wake up  . Foley catheter in place 05/30/2013   PT HAD SURG 05/30/13 - CYSTO AND CLOT EVACUATION BLADDER / PROSTATE - UNABLE TO VOID AFTER DISCHARGE - CAME BACK TO ER SAME NIGHT AND HAD FOLEY  INSERTED TO DRAINAGE BAG - URINE WAS BLOODY BUT PT REPORTS ON 06/07/13 THAT URINE IN BAG MOSTLY CLEAR NOW.  Marland Kitchen GERD (gastroesophageal reflux disease)   . History of kidney stones    x1  . Hypertension   . Muscle tension dysphonia    Past Surgical History:  Procedure Laterality Date  . BACK SURGERY     lumbar   . CYSTOSCOPY W/ URETEROSCOPY W/ LITHOTRIPSY    . CYSTOSCOPY/RETROGRADE/URETEROSCOPY/STONE EXTRACTION WITH BASKET N/A 05/30/2013   Procedure: CYSTOSCOPY , BLADDER BIOPSY AND CLOT EVACUATION , CAUTERIZATIO OF LATERAL RIGHT AND LEFT LOBE OF PROSTATE;  Surgeon: Kathi Ludwig, MD;  Location: [facility];  Service: Urology;  Laterality: N/A;  . HERNIA REPAIR     inguinal  . INSERTION OF MESH N/A 11/24/2017   Procedure: INSERTION OF MESH;  Surgeon: Manus Rudd, MD;  Location: [facility] OR;  Service: General;  Laterality: N/A;  . JOINT REPLACEMENT     '00-left/'08 [facility]  . KNEE ARTHROSCOPY     left knee meniscus surgery  . PROSTATE SURGERY Bilateral    7- 8 yrs ago  . SHOULDER ARTHROSCOPY Right   . SHOULDER ARTHROTOMY Left   . TRANSURETHRAL RESECTION OF PROSTATE N/A 06/09/2013   Procedure: TRANSURETHRAL RESECTION OF THE PROSTATE (TURP) WITH GYRUS;  Surgeon: Kathi Ludwig, MD;  Location: [facility];  Service: Urology;  Laterality: N/A;  . UMBILICAL HERNIA REPAIR N/A 11/24/2017   Procedure: UMBILICAL HERNIA REPAIR WITH MESH;  Surgeon: Manus Rudd, MD;  Location: [facility] OR;  Service: General;  Laterality: N/A;     No outpatient medications have been marked as taking for the 06/03/18 encounter (Appointment) with Runell Gess, MD.     Allergies:   Patient has no known allergies.   Social History   Tobacco Use  . Smoking status: Never Smoker  . Smokeless tobacco: Never Used  Substance Use Topics  . Alcohol use: Yes    Comment: social 1-2 drinks weekly  . Drug use: No     Family Hx: The patient's family history includes Heart attack in his father; Lung cancer in his mother.   ROS:   Please see the history of present illness.     All other systems reviewed and are negative.   Prior CV studies:   The following studies were reviewed today:  Coronary calcium score, Myoview stress test  Labs/Other Tests and Data Reviewed:    EKG:  No ECG reviewed.  Recent Labs: 11/17/2017: BUN 17; Creatinine, Ser 1.10; Hemoglobin 15.9; Platelets 141; Potassium 3.5; Sodium 140   Recent Lipid Panel No results found for: CHOL, TRIG, HDL, CHOLHDL, LDLCALC, LDLDIRECT  Wt Readings from Last 3 Encounters:  11/17/17 205 lb 12.8 oz (93.4 kg)  06/22/17 207 lb (93.9 kg)  04/27/17 207 lb (93.9 kg)     Objective:    Vital Signs:  There were no vitals taken for this visit.   VITAL SIGNS:  reviewed a complete physical exam was not performed today since this was a virtual telemedicine phone visit  ASSESSMENT & PLAN:    1. Essential hypertension- history of essential hypertension on chlorthalidone  and hydralazine with blood pressure measured today at 140/55 with a pulse of 54.  His blood pressures have been under good control on his current regimen. 2. Elevated coronary calcium score- coronary calcium score of 537 measured 05/07/2017 with a subsequent Myoview stress test performed 06/22/2017 which was entirely normal. 3. Dizziness- complaints of dizziness which he has had in the past when he is been under a lot of stress.  I suspect this is stress related.  Blood pressures have been under good control.  His heart rate is somewhat slow for him.  I am going to get a twelve-lead EKG in our office to further evaluate.  COVID-19 Education: The signs and symptoms of COVID-19 were discussed with the patient and how to seek care for testing (follow up with PCP or arrange E-visit).  The importance of social distancing was discussed today.  Time:   Today, I have spent 11 minutes with the patient with telehealth technology discussing the above problems.     Medication Adjustments/Labs and  Tests Ordered: Current medicines are reviewed at length with the patient today.  Concerns regarding medicines are outlined above.   Tests Ordered: No orders of the defined types were placed in this encounter.   Medication Changes: No orders of the defined types were placed in this encounter.   Disposition:  Follow up in 3 month(s)  Signed, Nanetta Batty, MD  06/03/2018 11:12 AM    [facility]

## 2018-06-03 NOTE — Telephone Encounter (Signed)
Patient and/or DPR-approved person aware of AVS instructions and verbalized understanding. Letter including After Visit Summary and any other necessary documents to be mailed to the patient's address on file. Pt aware of results of EKG performed on 5/15 and verbalized understanding

## 2018-06-03 NOTE — Progress Notes (Signed)
Pt presented to the office today for an EKG at the request of Dr. Allyson Sabal for bradycardia. EKG review by Dr. Jens Som (DOD) who state EKG relieved Sinus Bradycardia with a HR of 53. EKG will be scanned into chart for Dr. Allyson Sabal to review. Pt advised MD's nurse will call him for any further recommendations. Pt voiced understanding.

## 2018-06-03 NOTE — Patient Instructions (Addendum)
Medication Instructions:  Your physician recommends that you continue on your current medications as directed. Please refer to the Current Medication list given to you today.  If you need a refill on your cardiac medications before your next appointment, please call your pharmacy.   Lab work: NONE If you have labs (blood work) drawn today and your tests are completely normal, you will receive your results only by: Marland Kitchen MyChart Message (if you have MyChart) OR . A paper copy in the mail If you have any lab test that is abnormal or we need to change your treatment, we will call you to review the results.  Testing/Procedures: NONE  Follow-Up: At [facility], you and your health needs are our priority.  As part of our continuing mission to provide you with exceptional heart care, we have created designated Provider Care Teams.  These Care Teams include your primary Cardiologist (physician) and Advanced Practice Providers (APPs -  Physician Assistants and Nurse Practitioners) who all work together to provide you with the care you need, when you need it. You will need a follow up appointment in 3-4 months.  Please call our office 2 months in advance to schedule this appointment.    ADDITION INFORMATION: DR. Allyson Sabal REQUESTS THAT YOU HAVE AN ELECTROCARDIOGRAM (EKG) TODAY BECAUSE YOUR HEART RATE WAS LOW. YOU HAVE BEEN SCHEDULED FOR AN EKG AT 2:40 PM TODAY AT [facility].

## 2018-07-03 ENCOUNTER — Other Ambulatory Visit: Payer: Self-pay | Admitting: Cardiovascular Disease

## 2018-07-11 ENCOUNTER — Other Ambulatory Visit: Payer: Self-pay | Admitting: Pharmacist Clinician (PhC)/ Clinical Pharmacy Specialist

## 2018-07-11 MED ORDER — CHLORTHALIDONE 25 MG PO TABS: 25.0000 mg | ORAL_TABLET | Freq: Every day | ORAL | 3 refills | Status: DC

## 2018-07-11 MED ORDER — HYDRALAZINE HCL 100 MG PO TABS: 100.0000 mg | ORAL_TABLET | Freq: Two times a day (BID) | ORAL | 3 refills | Status: DC

## 2018-10-18 ENCOUNTER — Other Ambulatory Visit: Payer: Self-pay | Admitting: Orthopedic Surgery

## 2018-10-18 DIAGNOSIS — S63279A Dislocation of unspecified interphalangeal joint of unspecified finger, initial encounter: Secondary | ICD-10-CM

## 2018-10-19 ENCOUNTER — Other Ambulatory Visit: Payer: Self-pay

## 2018-10-19 ENCOUNTER — Ambulatory Visit
Admission: RE | Admit: 2018-10-19 | Discharge: 2018-10-19 | Disposition: A | Discharge status: Home / Self Care | Payer: Medicare Other | Source: Ambulatory Visit | Attending: Orthopedic Surgery | Admitting: Orthopedic Surgery

## 2018-10-19 DIAGNOSIS — S63279A Dislocation of unspecified interphalangeal joint of unspecified finger, initial encounter: Secondary | ICD-10-CM

## 2019-02-28 ENCOUNTER — Ambulatory Visit: Payer: Medicare Other | Attending: Internal Medicine

## 2019-03-02 ENCOUNTER — Ambulatory Visit: Payer: Medicare Other

## 2019-07-11 ENCOUNTER — Other Ambulatory Visit: Payer: Self-pay | Admitting: Cardiovascular Disease

## 2019-07-18 ENCOUNTER — Other Ambulatory Visit: Payer: Self-pay | Admitting: Cardiovascular Disease

## 2019-09-28 ENCOUNTER — Other Ambulatory Visit: Payer: Self-pay

## 2019-09-28 ENCOUNTER — Ambulatory Visit (INDEPENDENT_AMBULATORY_CARE_PROVIDER_SITE_OTHER): Payer: Medicare Other | Admitting: Physician Assistant

## 2019-09-28 VITALS — BP 132/62 | HR 68 | Ht 69.0 in | Wt 205.0 lb

## 2019-09-28 DIAGNOSIS — I10 Essential (primary) hypertension: Secondary | ICD-10-CM | POA: Diagnosis not present

## 2019-09-28 DIAGNOSIS — R42 Dizziness and giddiness: Secondary | ICD-10-CM

## 2019-09-28 DIAGNOSIS — I351 Nonrheumatic aortic (valve) insufficiency: Secondary | ICD-10-CM | POA: Diagnosis not present

## 2019-09-28 MED ORDER — MECLIZINE HCL 25 MG PO TABS: 25.0000 mg | ORAL_TABLET | Freq: Every day | ORAL | 1 refills | Status: DC

## 2019-09-28 NOTE — Patient Instructions (Signed)
Medication Instructions:   START Meclizine 25 mg daily  *If you need a refill on your cardiac medications before your next appointment, please call your pharmacy*  Lab Work: NONE ordered at this time of appointment   If you have labs (blood work) drawn today and your tests are completely normal, you will receive your results only by: Marland Kitchen MyChart Message (if you have MyChart) OR . A paper copy in the mail If you have any lab test that is abnormal or we need to change your treatment, we will call you to review the results.  Testing/Procedures: Your physician has requested that you have an echocardiogram. Echocardiography is a painless test that uses sound waves to create images of your heart. It provides your doctor with information about the size and shape of your heart and how well your heart's chambers and valves are working. This procedure takes approximately one hour. There are no restrictions for this procedure.   Please schedule for 2-3 weeks   Follow-Up: At [facility], you and your health needs are our priority.  As part of our continuing mission to provide you with exceptional heart care, we have created designated Provider Care Teams.  These Care Teams include your primary Cardiologist (physician) and Advanced Practice Providers (APPs -  Physician Assistants and Nurse Practitioners) who all work together to provide you with the care you need, when you need it.  We recommend signing up for the patient portal called "MyChart".  Sign up information is provided on this After Visit Summary.  MyChart is used to connect with patients for Virtual Visits (Telemedicine).  Patients are able to view lab/test results, encounter notes, upcoming appointments, etc.  Non-urgent messages can be sent to your provider as well.   To learn more about what you can do with MyChart, go to [email or web address].    Your next appointment:   3 week(s)  The format for your next appointment:   In  Person  Provider:   Azalee Course, PA-C  Other Instructions

## 2019-09-28 NOTE — Progress Notes (Signed)
Cardiology Office Note:    Date:  09/30/2019   ID:  Kevin [name], DOB September 21, 1945, MRN [id number]  PCP:  Dois Davenport, MD  [facility] Cardiologist:  Nanetta Batty, MD  [facility] Electrophysiologist:  None   Referring MD: Dois Davenport, MD   Chief Complaint  Patient presents with  . Follow-up    seen for Dr. Allyson Sabal, dizziness    History of Present Illness:    Kevin [name] is a 74 y.o. male with a hx of hypertension and GERD.  Previous echocardiogram obtained on 04/26/2017 showed EF 60 to 65%, grade 2 DD, moderate AI.  Coronary calcium score obtained in April 2019 was 537.  Myoview obtained on 06/22/2017 was normal.  Patient was last seen by Dr. Allyson Sabal via virtual visit in May 2020 at which time he complained of dizziness while under a lot of stress due to family issue.  Patient has a longstanding history of dizziness for the past several years, however given the recent month, his dizziness has become more prolonged.  Worse previous dizzy spell usually last 15 to 20 minutes, now the dizzy spell would last 2 to 3 hours.  During the dizzy spell, he usually feels the room is spinning.  He denies any nausea, vomiting, chest pain, shortness of breath or feeling of passing out.  Dizziness has no association with body position or the degree of exertion.  He says the symptoms can occur at any time.  Last episode was yesterday.  I recommend a repeat echocardiogram to reassess the aortic valve to make sure his valve is not getting worse.  I did recommend a trial of meclizine 25 mg daily.  I plan to bring the patient back in 2 to 3 weeks for reassessment.  If symptom improved, then no further work-up is needed.  However if the symptom continues to worsen or stay the same, I likely will order 2 weeks Zio monitor and referred the patient to neurology service at the time.   Past Medical History:  Diagnosis Date  . Arthritis    osteoarthritis-hips/ shoulders- no issues at present  .  Complication of anesthesia    longer to wake up  . Foley catheter in place 05/30/2013   PT HAD SURG 05/30/13 - CYSTO AND CLOT EVACUATION BLADDER / PROSTATE - UNABLE TO VOID AFTER DISCHARGE - CAME BACK TO ER SAME NIGHT AND HAD FOLEY INSERTED TO DRAINAGE BAG - URINE WAS BLOODY BUT PT REPORTS ON 06/07/13 THAT URINE IN BAG MOSTLY CLEAR NOW.  Marland Kitchen GERD (gastroesophageal reflux disease)   . History of kidney stones    x1  . Hypertension   . Muscle tension dysphonia     Past Surgical History:  Procedure Laterality Date  . BACK SURGERY     lumbar   . CYSTOSCOPY W/ URETEROSCOPY W/ LITHOTRIPSY    . CYSTOSCOPY/RETROGRADE/URETEROSCOPY/STONE EXTRACTION WITH BASKET N/A 05/30/2013   Procedure: CYSTOSCOPY , BLADDER BIOPSY AND CLOT EVACUATION , CAUTERIZATIO OF LATERAL RIGHT AND LEFT LOBE OF PROSTATE;  Surgeon: Kathi Ludwig, MD;  Location: [facility];  Service: Urology;  Laterality: N/A;  . HERNIA REPAIR     inguinal  . INSERTION OF MESH N/A 11/24/2017   Procedure: INSERTION OF MESH;  Surgeon: Manus Rudd, MD;  Location: [facility];  Service: General;  Laterality: N/A;  . JOINT REPLACEMENT     '00-left/'08 RTHA  . KNEE ARTHROSCOPY     left knee meniscus surgery  . PROSTATE SURGERY Bilateral  7- 8 yrs ago  . SHOULDER ARTHROSCOPY Right   . SHOULDER ARTHROTOMY Left   . TRANSURETHRAL RESECTION OF PROSTATE N/A 06/09/2013   Procedure: TRANSURETHRAL RESECTION OF THE PROSTATE (TURP) WITH GYRUS;  Surgeon: Kathi Ludwig, MD;  Location: [facility];  Service: Urology;  Laterality: N/A;  . UMBILICAL HERNIA REPAIR N/A 11/24/2017   Procedure: UMBILICAL HERNIA REPAIR WITH MESH;  Surgeon: Manus Rudd, MD;  Location: [facility];  Service: General;  Laterality: N/A;    Current Medications: Current Meds  Medication Sig  . amoxicillin (AMOXIL) 500 MG capsule Before dental procedures  . aspirin 81 MG EC tablet Take 1 tablet (81 mg total) by mouth daily. Swallow whole.  . budesonide (PULMICORT) 180 MCG/ACT inhaler Inhale  1 puff into the lungs as needed.   . chlorthalidone (HYGROTON) 25 MG tablet Take 1 tablet (25 mg total) by mouth daily. NEED OV.  . Ginger, Zingiber officinalis, (GINGER ROOT) 550 MG CAPS Take 550 mg by mouth daily.  . hydrALAZINE (APRESOLINE) 100 MG tablet TAKE 1 TABLET(100 MG) BY MOUTH TWICE DAILY  . latanoprost (XALATAN) 0.005 % ophthalmic solution Place 1 drop into both eyes at bedtime.  . meloxicam (MOBIC) 15 MG tablet Take 15 mg by mouth daily.  . Methylsulfonylmethane 1000 MG CAPS Take 2,000 mg by mouth daily.  . Misc Natural Products (TART CHERRY ADVANCED PO) Take 2 capsules by mouth daily.  . montelukast (SINGULAIR) 10 MG tablet Take 10 mg by mouth daily.   . naproxen sodium (ALEVE) 220 MG tablet Take 220 mg by mouth daily.  . SYSTANE ULTRA 0.4-0.3 % SOLN Apply 1 drop to eye 3 (three) times daily.  . traMADol (ULTRAM) 50 MG tablet Take 50 mg by mouth every 6 (six) hours as needed.  . TURMERIC PO Take 2 capsules by mouth daily.  Cliffton Asters Petrolatum-Mineral Oil (SYSTANE NIGHTTIME) OINT SMARTSIG:1 Inch(es) In Eye(s) Every Night     Allergies:   Patient has no known allergies.   Social History   Socioeconomic History  . Marital status: Married    Spouse name: Not on file  . Number of children: 2  . Years of education: College gr  . Highest education level: Not on file  Occupational History  . Occupation: Scientist, research (medical)  Tobacco Use  . Smoking status: Never Smoker  . Smokeless tobacco: Never Used  Vaping Use  . Vaping Use: Never used  Substance and Sexual Activity  . Alcohol use: Yes    Comment: social 1-2 drinks weekly  . Drug use: No  . Sexual activity: Yes  Other Topics Concern  . Not on file  Social History Narrative  . Not on file   Social Determinants of Health   Financial Resource Strain:   . Difficulty of Paying Living Expenses: Not on file  Food Insecurity:   . Worried About Programme researcher, broadcasting/film/video in the Last Year: Not on file  . Ran Out of Food in the  Last Year: Not on file  Transportation Needs:   . Lack of Transportation (Medical): Not on file  . Lack of Transportation (Non-Medical): Not on file  Physical Activity:   . Days of Exercise per Week: Not on file  . Minutes of Exercise per Session: Not on file  Stress:   . Feeling of Stress : Not on file  Social Connections:   . Frequency of Communication with Friends and Family: Not on file  . Frequency of Social Gatherings with Friends and Family: Not on  file  . Attends Religious Services: Not on file  . Active Member of Clubs or Organizations: Not on file  . Attends BankerClub or Organization Meetings: Not on file  . Marital Status: Not on file     Family History: The patient's family history includes Heart attack in his father; Lung cancer in his mother.  ROS:   Please see the history of present illness.     All other systems reviewed and are negative.  EKGs/Labs/Other Studies Reviewed:    The following studies were reviewed today:  Echo 04/26/2017 LV EF: 60% -  65%   Study Conclusions   - Left ventricle: The cavity size was normal. Wall thickness was  increased in a pattern of mild LVH. Systolic function was normal.  The estimated ejection fraction was in the range of 60% to 65%.  Wall motion was normal; there were no regional wall motion  abnormalities. Features are consistent with a pseudonormal left  ventricular filling pattern, with concomitant abnormal relaxation  and increased filling pressure (grade 2 diastolic dysfunction).  - Aortic valve: There was moderate regurgitation.  - Aortic root: The aortic root was mildly dilated.  - Mitral valve: Calcified annulus. There was systolic anterior  motion of the chordal structures.  - Left atrium: The atrium was mildly dilated.  - Right ventricle: The cavity size was mildly dilated.   Impressions:   - Normal LV systolic function; moderate diastolic dysfunction; mild  LVH; moderate AI; mildly dilated aortic  root; mild LAE; mild RVE;  mild TR with mild pulmonary hypertension.   EKG:  EKG is ordered today.  The ekg ordered today demonstrates normal sinus rhythm, T wave inversion in lead III and aVF.  Recent Labs: No results found for requested labs within last 8760 hours.  Recent Lipid Panel No results found for: CHOL, TRIG, HDL, CHOLHDL, VLDL, LDLCALC, LDLDIRECT  Physical Exam:    VS:  BP 132/62   Pulse 68   Ht 5\' 9"  (1.753 m)   Wt 205 lb (93 kg)   SpO2 99%   BMI 30.27 kg/m     Wt Readings from Last 3 Encounters:  09/28/19 205 lb (93 kg)  06/03/18 192 lb (87.1 kg)  11/17/17 205 lb 12.8 oz (93.4 kg)     GEN:  Well nourished, well developed in no acute distress HEENT: Normal NECK: No JVD; No carotid bruits LYMPHATICS: No lymphadenopathy CARDIAC: RRR, no murmurs, rubs, gallops RESPIRATORY:  Clear to auscultation without rales, wheezing or rhonchi  ABDOMEN: Soft, non-tender, non-distended MUSCULOSKELETAL:  No edema; No deformity  SKIN: Warm and dry NEUROLOGIC:  Alert and oriented x 3 PSYCHIATRIC:  Normal affect   ASSESSMENT:    1. Dizziness   2. Aortic valve insufficiency, etiology of cardiac valve disease unspecified   3. Essential hypertension    PLAN:    In order of problems listed above:  1. Dizziness: Symptom does not seem to be orthostatic in nature as it does not occur with body position changes.  When the symptom does occur, he feels the room is spinning.  I recommended a trial of meclizine.  I plan to bring the patient back in 2 to 3 weeks for follow-up, if symptom does not improve, likely will need to obtain 2-week Zio monitor and referred the patient to neurology and vestibular rehab  2. Aortic insufficiency: Seen on previous echocardiogram.  We will repeat echo to make sure aortic regurgitation has not worsened  3. Hypertension: Blood pressure stable.  Medication Adjustments/Labs and Tests Ordered: Current medicines are reviewed at length with the  patient today.  Concerns regarding medicines are outlined above.  Orders Placed This Encounter  Procedures  . EKG 12-Lead  . ECHOCARDIOGRAM COMPLETE   Meds ordered this encounter  Medications  . meclizine (ANTIVERT) 25 MG tablet    Sig: Take 1 tablet (25 mg total) by mouth daily.    Dispense:  90 tablet    Refill:  1    Patient Instructions  Medication Instructions:   START Meclizine 25 mg daily  *If you need a refill on your cardiac medications before your next appointment, please call your pharmacy*  Lab Work: NONE ordered at this time of appointment   If you have labs (blood work) drawn today and your tests are completely normal, you will receive your results only by: Marland Kitchen MyChart Message (if you have MyChart) OR . A paper copy in the mail If you have any lab test that is abnormal or we need to change your treatment, we will call you to review the results.  Testing/Procedures: Your physician has requested that you have an echocardiogram. Echocardiography is a painless test that uses sound waves to create images of your heart. It provides your doctor with information about the size and shape of your heart and how well your heart's chambers and valves are working. This procedure takes approximately one hour. There are no restrictions for this procedure.   Please schedule for 2-3 weeks   Follow-Up: At [facility], you and your health needs are our priority.  As part of our continuing mission to provide you with exceptional heart care, we have created designated Provider Care Teams.  These Care Teams include your primary Cardiologist (physician) and Advanced Practice Providers (APPs -  Physician Assistants and Nurse Practitioners) who all work together to provide you with the care you need, when you need it.  We recommend signing up for the patient portal called "MyChart".  Sign up information is provided on this After Visit Summary.  MyChart is used to connect with patients for  Virtual Visits (Telemedicine).  Patients are able to view lab/test results, encounter notes, upcoming appointments, etc.  Non-urgent messages can be sent to your provider as well.   To learn more about what you can do with MyChart, go to [email or web address].    Your next appointment:   3 week(s)  The format for your next appointment:   In Person  Provider:   Azalee Course, PA-C  Other Instructions      Signed, Azalee Course, PA  09/30/2019 10:55 PM    [facility]

## 2019-09-30 ENCOUNTER — Encounter: Payer: Self-pay | Admitting: Physician Assistant

## 2019-10-12 ENCOUNTER — Other Ambulatory Visit: Payer: Self-pay | Admitting: Cardiovascular Disease

## 2019-10-13 ENCOUNTER — Ambulatory Visit (HOSPITAL_COMMUNITY): Payer: Medicare Other | Attending: Cardiology

## 2019-10-13 ENCOUNTER — Other Ambulatory Visit: Payer: Self-pay

## 2019-10-13 DIAGNOSIS — I351 Nonrheumatic aortic (valve) insufficiency: Secondary | ICD-10-CM | POA: Diagnosis not present

## 2019-10-13 LAB — ECHOCARDIOGRAM COMPLETE
Area-P 1/2: 3.12 cm2
P 1/2 time: 435 msec
S' Lateral: 3.2 cm

## 2019-10-16 ENCOUNTER — Other Ambulatory Visit: Payer: Self-pay | Admitting: Cardiovascular Disease

## 2019-10-19 ENCOUNTER — Telehealth: Payer: Medicare Other | Admitting: Physician Assistant

## 2019-10-24 ENCOUNTER — Other Ambulatory Visit: Payer: Self-pay

## 2019-10-24 ENCOUNTER — Encounter: Payer: Self-pay | Admitting: *Deleted

## 2019-10-24 ENCOUNTER — Ambulatory Visit (INDEPENDENT_AMBULATORY_CARE_PROVIDER_SITE_OTHER): Payer: Medicare Other | Admitting: Physician Assistant

## 2019-10-24 ENCOUNTER — Encounter: Payer: Self-pay | Admitting: Neurology

## 2019-10-24 ENCOUNTER — Encounter: Payer: Self-pay | Admitting: Physician Assistant

## 2019-10-24 VITALS — BP 150/72 | HR 66 | Ht 69.0 in | Wt 207.4 lb

## 2019-10-24 DIAGNOSIS — I10 Essential (primary) hypertension: Secondary | ICD-10-CM

## 2019-10-24 DIAGNOSIS — I2510 Atherosclerotic heart disease of native coronary artery without angina pectoris: Secondary | ICD-10-CM | POA: Diagnosis not present

## 2019-10-24 DIAGNOSIS — R42 Dizziness and giddiness: Secondary | ICD-10-CM | POA: Diagnosis not present

## 2019-10-24 DIAGNOSIS — I2584 Coronary atherosclerosis due to calcified coronary lesion: Secondary | ICD-10-CM | POA: Diagnosis not present

## 2019-10-24 MED ORDER — MECLIZINE HCL 25 MG PO TABS: 25.0000 mg | ORAL_TABLET | Freq: Every day | ORAL | 1 refills | Status: DC | PRN

## 2019-10-24 NOTE — Progress Notes (Signed)
Patient ID: Kevin [name], male   DOB: 09-07-1945, 74 y.o.   MRN: [id number] Patient enrolled for [employer] to ship a 14 day ZIO XT long term holter monitor to her home.

## 2019-10-24 NOTE — Progress Notes (Signed)
Cardiology Office Note:    Date:  10/26/2019   ID:  Kevin [name], DOB [birth year], MRN [id number]  PCP:  Dois Davenport, MD  [facility] Cardiologist:  Nanetta Batty, MD  [facility] Electrophysiologist:  None   Referring MD: Dois Davenport, MD   Chief Complaint  Patient presents with   Follow-up    seen for Dr. Allyson Sabal    History of Present Illness:    Kevin [name] is a 74 y.o. male with a hx of hypertension and GERD.  Previous echocardiogram obtained on 04/26/2017 showed EF 60 to 65%, grade 2 DD, moderate AI.  Coronary calcium score obtained in April 2019 was 537.  Myoview obtained on 06/22/2017 was normal.  Patient was last seen by Dr. Allyson Sabal via virtual visit in May 2020 at which time he complained of dizziness while under a lot of stress due to family issue.  Patient has a longstanding history of dizziness for the past several years, however given the recent month, his dizziness has become more prolonged.  Worse previous dizzy spell usually last 15 to 20 minutes, now the dizzy spell would last 2 to 3 hours.  During the dizzy spell, he usually feels the room is spinning.  He denies any nausea, vomiting, chest pain, shortness of breath or feeling of passing out.  Dizziness has no association with body position or the degree of exertion.  He says the symptoms can occur at any time. I recommend a repeat echocardiogram to reassess the aortic valve to make sure his valve is not getting worse.  I did recommend a trial of meclizine 25 mg daily as a trial for several weeks.  Patient presents today for cardiology office visit.  He did not take the meclizine very frequently.  His symptoms still persist, at this point I will order a 2-week ZIO monitor.  Otherwise, I will also refer him to neurology service for evaluation of dizzy spell as well.  He denies any kind of chest pain or shortness of breath.  He has no lower extremity edema, orthopnea or PND.  He can follow-up with cardiology  service in 5 weeks.   Past Medical History:  Diagnosis Date   Arthritis    osteoarthritis-hips/ shoulders- no issues at present   Complication of anesthesia    longer to wake up   Foley catheter in place 05/30/2013   PT HAD SURG 05/30/13 - CYSTO AND CLOT EVACUATION BLADDER / PROSTATE - UNABLE TO VOID AFTER DISCHARGE - CAME BACK TO ER SAME NIGHT AND HAD FOLEY INSERTED TO DRAINAGE BAG - URINE WAS BLOODY BUT PT REPORTS ON 06/07/13 THAT URINE IN BAG MOSTLY CLEAR NOW.   GERD (gastroesophageal reflux disease)    History of kidney stones    x1   Hypertension    Muscle tension dysphonia     Past Surgical History:  Procedure Laterality Date   BACK SURGERY     lumbar    CYSTOSCOPY W/ URETEROSCOPY W/ LITHOTRIPSY     CYSTOSCOPY/RETROGRADE/URETEROSCOPY/STONE EXTRACTION WITH BASKET N/A 05/30/2013   Procedure: CYSTOSCOPY , BLADDER BIOPSY AND CLOT EVACUATION , CAUTERIZATIO OF LATERAL RIGHT AND LEFT LOBE OF PROSTATE;  Surgeon: Kathi Ludwig, MD;  Location: [facility];  Service: Urology;  Laterality: N/A;   HERNIA REPAIR     inguinal   INSERTION OF MESH N/A 11/24/2017   Procedure: INSERTION OF MESH;  Surgeon: Manus Rudd, MD;  Location: [facility] OR;  Service: General;  Laterality: N/A;   JOINT REPLACEMENT     '  00-left/'08 RTHA   KNEE ARTHROSCOPY     left knee meniscus surgery   PROSTATE SURGERY Bilateral    7- 8 yrs ago   SHOULDER ARTHROSCOPY Right    SHOULDER ARTHROTOMY Left    TRANSURETHRAL RESECTION OF PROSTATE N/A 06/09/2013   Procedure: TRANSURETHRAL RESECTION OF THE PROSTATE (TURP) WITH GYRUS;  Surgeon: Kathi Ludwig, MD;  Location: [facility];  Service: Urology;  Laterality: N/A;   UMBILICAL HERNIA REPAIR N/A 11/24/2017   Procedure: UMBILICAL HERNIA REPAIR WITH MESH;  Surgeon: Manus Rudd, MD;  Location: [facility];  Service: General;  Laterality: N/A;    Current Medications: Current Meds  Medication Sig   amoxicillin (AMOXIL) 500 MG capsule Before dental procedures     aspirin 81 MG EC tablet Take 1 tablet (81 mg total) by mouth daily. Swallow whole.   budesonide (PULMICORT) 180 MCG/ACT inhaler Inhale 1 puff into the lungs as needed.    chlorthalidone (HYGROTON) 25 MG tablet TAKE 1 TABLET(25 MG) BY MOUTH DAILY   Ginger, Zingiber officinalis, (GINGER ROOT) 550 MG CAPS Take 550 mg by mouth daily.   hydrALAZINE (APRESOLINE) 100 MG tablet TAKE 1 TABLET(100 MG) BY MOUTH TWICE DAILY   latanoprost (XALATAN) 0.005 % ophthalmic solution Place 1 drop into both eyes at bedtime.   meclizine (ANTIVERT) 25 MG tablet Take 1 tablet (25 mg total) by mouth daily as needed for dizziness.   meloxicam (MOBIC) 15 MG tablet Take 15 mg by mouth daily.   Methylsulfonylmethane 1000 MG CAPS Take 2,000 mg by mouth daily.   Misc Natural Products (TART CHERRY ADVANCED PO) Take 2 capsules by mouth daily.   montelukast (SINGULAIR) 10 MG tablet Take 10 mg by mouth daily.    SYSTANE ULTRA 0.4-0.3 % SOLN Apply 1 drop to eye 3 (three) times daily.   traMADol (ULTRAM) 50 MG tablet Take 50 mg by mouth every 6 (six) hours as needed.   TURMERIC PO Take 2 capsules by mouth daily.   White Petrolatum-Mineral Oil (SYSTANE NIGHTTIME) OINT SMARTSIG:1 Inch(es) In Eye(s) Every Night   [DISCONTINUED] meclizine (ANTIVERT) 25 MG tablet Take 1 tablet (25 mg total) by mouth daily.     Allergies:   Patient has no known allergies.   Social History   Socioeconomic History   Marital status: Married    Spouse name: Not on file   Number of children: 2   Years of education: College gr   Highest education level: Not on file  Occupational History   Occupation: Scientist, research (medical)  Tobacco Use   Smoking status: Never Smoker   Smokeless tobacco: Never Used  Building services engineer Use: Never used  Substance and Sexual Activity   Alcohol use: Yes    Comment: social 1-2 drinks weekly   Drug use: No   Sexual activity: Yes  Other Topics Concern   Not on file  Social History  Narrative   Not on file   Social Determinants of Health   Financial Resource Strain:    Difficulty of Paying Living Expenses: Not on file  Food Insecurity:    Worried About Programme researcher, broadcasting/film/video in the Last Year: Not on file   [employer] of Food in the Last Year: Not on file  Transportation Needs:    Lack of Transportation (Medical): Not on file   Lack of Transportation (Non-Medical): Not on file  Physical Activity:    Days of Exercise per Week: Not on file   Minutes of Exercise per Session: Not  on file  Stress:    Feeling of Stress : Not on file  Social Connections:    Frequency of Communication with Friends and Family: Not on file   Frequency of Social Gatherings with Friends and Family: Not on file   Attends Religious Services: Not on file   Active Member of Clubs or Organizations: Not on file   Attends BankerClub or Organization Meetings: Not on file   Marital Status: Not on file     Family History: The patient's family history includes Heart attack in his father; Lung cancer in his mother.  ROS:   Please see the history of present illness.     All other systems reviewed and are negative.  EKGs/Labs/Other Studies Reviewed:    The following studies were reviewed today:  Echo 10/13/2019 1. Left ventricular ejection fraction, by estimation, is 60 to 65%. The  left ventricle has normal function. The left ventricle has no regional  wall motion abnormalities. There is mild left ventricular hypertrophy.  Left ventricular diastolic parameters  are consistent with Grade II diastolic dysfunction (pseudonormalization).  2. Right ventricular systolic function is normal. The right ventricular  size is normal. There is normal pulmonary artery systolic pressure. The  estimated right ventricular systolic pressure is 29.8 mmHg.  3. Left atrial size was mildly dilated.  4. The mitral valve is normal in structure. Trivial mitral valve  regurgitation. No evidence of mitral  stenosis.  5. The aortic valve is tricuspid. Aortic valve regurgitation is moderate,  eccentric. No holodiastolic flow reversal in the descending thoracic  aorta. No aortic stenosis is present.  6. Aortic dilatation noted. There is mild dilatation of the aortic root,  measuring 40 mm.  7. The inferior vena cava is normal in size with greater than 50%  respiratory variability, suggesting right atrial pressure of 3 mmHg.   EKG:  EKG is not ordered today.   Recent Labs: No results found for requested labs within last 8760 hours.  Recent Lipid Panel No results found for: CHOL, TRIG, HDL, CHOLHDL, VLDL, LDLCALC, LDLDIRECT  Physical Exam:    VS:  BP (!) 150/72    Pulse 66    Ht 5\' 9"  (1.753 m)    Wt 207 lb 6.4 oz (94.1 kg)    BMI 30.63 kg/m     Wt Readings from Last 3 Encounters:  10/24/19 207 lb 6.4 oz (94.1 kg)  09/28/19 205 lb (93 kg)  06/03/18 192 lb (87.1 kg)     GEN:  Well nourished, well developed in no acute distress HEENT: Normal NECK: No JVD; No carotid bruits LYMPHATICS: No lymphadenopathy CARDIAC: RRR, no murmurs, rubs, gallops RESPIRATORY:  Clear to auscultation without rales, wheezing or rhonchi  ABDOMEN: Soft, non-tender, non-distended MUSCULOSKELETAL:  No edema; No deformity  SKIN: Warm and dry NEUROLOGIC:  Alert and oriented x 3 PSYCHIATRIC:  Normal affect   ASSESSMENT:    1. Dizziness   2. Essential hypertension   3. Coronary artery calcification    PLAN:    In order of problems listed above:  1. Dizziness: He stopped taking meclizine only after 2 days.  I recommended trial of meclizine as needed again.  We will proceed with 2-week ZIO monitor and referred to neurology for evaluation of dizzy spell.  2. Hypertension: Blood pressure only elevated today, however blood pressure was normal during the last office visit  3. Coronary artery calcification: Previous coronary artery calcium score obtained in April 2019 was 537.  Myoview obtained in June  2019  was normal.   Medication Adjustments/Labs and Tests Ordered: Current medicines are reviewed at length with the patient today.  Concerns regarding medicines are outlined above.  Orders Placed This Encounter  Procedures   Ambulatory referral to Neurology   LONG TERM MONITOR (3-14 DAYS)   Meds ordered this encounter  Medications   meclizine (ANTIVERT) 25 MG tablet    Sig: Take 1 tablet (25 mg total) by mouth daily as needed for dizziness.    Dispense:  90 tablet    Refill:  1    Patient Instructions  Medication Instructions:  CHANGE meclizine to take only as needed Continue all other current medications  *If you need a refill on your cardiac medications before your next appointment, please call your pharmacy*  Testing/Procedures: ZIO Monitor Instructions   Your physician has requested you wear your ZIO patch monitor___14___days.   This is a single patch monitor.  [employer] supplies one patch monitor per enrollment.  Additional stickers are not available.   Please do not apply patch if you will be having a Nuclear Stress Test, Echocardiogram, Cardiac CT, MRI, or Chest Xray during the time frame you would be wearing the monitor. The patch cannot be worn during these tests.  You cannot remove and re-apply the ZIO XT patch monitor.   Your ZIO patch monitor will be sent USPS Priority mail from [facility] directly to your home address. The monitor may also be mailed to a PO BOX if home delivery is not available.   It may take 3-5 days to receive your monitor after you have been enrolled.   Once you have received you monitor, please review enclosed instructions.  Your monitor has already been registered assigning a specific monitor serial # to you.   Applying the monitor   Shave hair from upper left chest.   Hold abrader disc by orange tab.  Rub abrader in 40 strokes over left upper chest as indicated in your monitor instructions.   Clean area with 4 enclosed alcohol pads  .  Use all pads to assure are is cleaned thoroughly.  Let dry.   Apply patch as indicated in monitor instructions.  Patch will be place under collarbone on left side of chest with arrow pointing upward.   Rub patch adhesive wings for 2 minutes.Remove white label marked "1".  Remove white label marked "2".  Rub patch adhesive wings for 2 additional minutes.   While looking in a mirror, press and release button in center of patch.  A small green light will flash 3-4 times .  This will be your only indicator the monitor has been turned on.     Do not shower for the first 24 hours.  You may shower after the first 24 hours.   Press button if you feel a symptom. You will hear a small click.  Record Date, Time and Symptom in the Patient Log Book.   When you are ready to remove patch, follow instructions on last 2 pages of Patient Log Book.  Stick patch monitor onto last page of Patient Log Book.   Place Patient Log Book in [place] box.  Use locking tab on box and tape box closed securely.  The Orange and [employer] has [employer] on it.  Please place in mailbox as soon as possible.  Your physician should have your test results approximately 7 days after the monitor has been mailed back to [facility].   Call [facility] Customer Care at [phone]  if you have questions regarding your ZIO XT patch monitor.  Call them immediately if you see an orange light blinking on your monitor.   If your monitor falls off in less than 4 days contact our Monitor department at [phone].  If your monitor becomes loose or falls off after 4 days call [employer] at [phone] for suggestions on securing your monitor.     Follow-Up: At [facility], you and your health needs are our priority.  As part of our continuing mission to provide you with exceptional heart care, we have created designated Provider Care Teams.  These Care Teams include your primary Cardiologist (physician) and Advanced Practice  Providers (APPs -  Physician Assistants and Nurse Practitioners) who all work together to provide you with the care you need, when you need it.  We recommend signing up for the patient portal called "MyChart".  Sign up information is provided on this After Visit Summary.  MyChart is used to connect with patients for Virtual Visits (Telemedicine).  Patients are able to view lab/test results, encounter notes, upcoming appointments, etc.  Non-urgent messages can be sent to your provider as well.   To learn more about what you can do with MyChart, go to [email or web address].    Your next appointment:   5 week(s)  The format for your next appointment:   In Person  Provider:     Azalee Course, PA-C    Other Instructions You have been referred to [facility] for evaluation of dizziness Address: [address] [place] [place], [place] [address] Phone: [phone]     Signed, Azalee Course, [place]  10/26/2019 8:29 PM    [facility]

## 2019-10-24 NOTE — Patient Instructions (Signed)
Medication Instructions:  CHANGE meclizine to take only as needed Continue all other current medications  *If you need a refill on your cardiac medications before your next appointment, please call your pharmacy*  Testing/Procedures: ZIO Monitor Instructions   Your physician has requested you wear your ZIO patch monitor___14___days.   This is a single patch monitor.  [employer] supplies one patch monitor per enrollment.  Additional stickers are not available.   Please do not apply patch if you will be having a Nuclear Stress Test, Echocardiogram, Cardiac CT, MRI, or Chest Xray during the time frame you would be wearing the monitor. The patch cannot be worn during these tests.  You cannot remove and re-apply the ZIO XT patch monitor.   Your ZIO patch monitor will be sent USPS Priority mail from [facility] directly to your home address. The monitor may also be mailed to a PO BOX if home delivery is not available.   It may take 3-5 days to receive your monitor after you have been enrolled.   Once you have received you monitor, please review enclosed instructions.  Your monitor has already been registered assigning a specific monitor serial # to you.   Applying the monitor   Shave hair from upper left chest.   Hold abrader disc by orange tab.  Rub abrader in 40 strokes over left upper chest as indicated in your monitor instructions.   Clean area with 4 enclosed alcohol pads .  Use all pads to assure are is cleaned thoroughly.  Let dry.   Apply patch as indicated in monitor instructions.  Patch will be place under collarbone on left side of chest with arrow pointing upward.   Rub patch adhesive wings for 2 minutes.Remove white label marked "1".  Remove white label marked "2".  Rub patch adhesive wings for 2 additional minutes.   While looking in a mirror, press and release button in center of patch.  A small green light will flash 3-4 times .  This will be your only indicator the  monitor has been turned on.     Do not shower for the first 24 hours.  You may shower after the first 24 hours.   Press button if you feel a symptom. You will hear a small click.  Record Date, Time and Symptom in the Patient Log Book.   When you are ready to remove patch, follow instructions on last 2 pages of Patient Log Book.  Stick patch monitor onto last page of Patient Log Book.   Place Patient Log Book in [place] box.  Use locking tab on box and tape box closed securely.  The [employer] and [employer] has [employer] on it.  Please place in mailbox as soon as possible.  Your physician should have your test results approximately 7 days after the monitor has been mailed back to [facility].   Call [facility] Customer Care at [phone] if you have questions regarding your ZIO XT patch monitor.  Call them immediately if you see an orange light blinking on your monitor.   If your monitor falls off in less than 4 days contact our Monitor department at [phone].  If your monitor becomes loose or falls off after 4 days call [employer] at [phone] for suggestions on securing your monitor.     Follow-Up: At [facility], you and your health needs are our priority.  As part of our continuing mission to provide you with exceptional heart care, we have created designated Provider  Care Teams.  These Care Teams include your primary Cardiologist (physician) and Advanced Practice Providers (APPs -  Physician Assistants and Nurse Practitioners) who all work together to provide you with the care you need, when you need it.  We recommend signing up for the patient portal called "MyChart".  Sign up information is provided on this After Visit Summary.  MyChart is used to connect with patients for Virtual Visits (Telemedicine).  Patients are able to view lab/test results, encounter notes, upcoming appointments, etc.  Non-urgent messages can be sent to your provider as well.   To learn more about  what you can do with MyChart, go to [email or web address].    Your next appointment:   5 week(s)  The format for your next appointment:   In Person  Provider:     Azalee Course, PA-C    Other Instructions You have been referred to [facility] for evaluation of dizziness Address: [address] [place], [place] [address] Phone: [phone]

## 2019-10-26 ENCOUNTER — Encounter: Payer: Self-pay | Admitting: Physician Assistant

## 2019-10-30 ENCOUNTER — Other Ambulatory Visit: Payer: Self-pay

## 2019-10-30 ENCOUNTER — Encounter: Payer: Medicare Other | Admitting: Family Medicine

## 2019-10-30 NOTE — Progress Notes (Signed)
Entered in error

## 2019-10-31 ENCOUNTER — Ambulatory Visit (INDEPENDENT_AMBULATORY_CARE_PROVIDER_SITE_OTHER): Payer: Medicare Other

## 2019-10-31 DIAGNOSIS — R42 Dizziness and giddiness: Secondary | ICD-10-CM

## 2019-11-09 NOTE — Progress Notes (Signed)
NEUROLOGY CONSULTATION NOTE  TRAVUS OREN MRN: [id number] DOB: 03/12/1945  Referring provider: Azalee Course, PA Primary care provider: Nadyne Coombes, MD  Reason for consult:  dizziness  HISTORY OF PRESENT ILLNESS: Nell Schrack. Swingler is a 74 year old right-handed male with HTN who presents for dizziness.  History supplemented by referring provider's note.  He began having episodes of dizziness 8 to 12 months ago.  He describes it as both a spinning and lightheaded sensation.  Sometimes he may feel briefly confused for a moment.  Onset is acute and gradually dissipates.  It may last up to an hour and seems to occur only if he has been wearing his face mask for Covid-19 for an extended period of time or talking with the mask on.  It also may occur if he is playing his trumpet.  At first it was sporadic and would occur every couple of weeks.  Over the past 2 to 3 months, it has been occurring daily. No nausea, visual disturbance, numbness or weakness.  He underwent a cardiac evaluation.  EKG was unremarkable.  Echocardiogram showed EF 60-65% with Grade II diastolic dysfunction, moderate AR and trivial MR.  He is currently wearing a cardiac event monitor.  In 2019, he saw ENT and was told he had a deviated septum.  He recently followed up with ENT and was told that the deviated septum has significant worsened.  He is scheduled for septoplasty early next month.    Remote MRI of the brain on 05/03/2014 personally reviewed was normal.  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  Extracranial arteries were unremarkable.  PAST MEDICAL HISTORY: Past Medical History:  Diagnosis Date  . Arthritis    osteoarthritis-hips/  shoulders- no issues at present  . Complication of anesthesia    longer to wake up  . Foley catheter in place 05/30/2013   PT HAD SURG 05/30/13 - CYSTO AND CLOT EVACUATION BLADDER / PROSTATE - UNABLE TO VOID AFTER DISCHARGE - CAME BACK TO ER SAME NIGHT AND HAD FOLEY INSERTED TO DRAINAGE BAG - URINE WAS BLOODY BUT PT REPORTS ON 06/07/13 THAT URINE IN BAG MOSTLY CLEAR NOW.  Marland Kitchen GERD (gastroesophageal reflux disease)   . History of kidney stones    x1  . Hypertension   . Muscle tension dysphonia     PAST SURGICAL HISTORY: Past Surgical History:  Procedure Laterality Date  . BACK SURGERY     lumbar   . CYSTOSCOPY W/ URETEROSCOPY W/ LITHOTRIPSY    . CYSTOSCOPY/RETROGRADE/URETEROSCOPY/STONE EXTRACTION WITH BASKET N/A 05/30/2013   Procedure: CYSTOSCOPY , BLADDER BIOPSY AND CLOT EVACUATION , CAUTERIZATIO OF LATERAL RIGHT AND LEFT LOBE OF PROSTATE;  Surgeon: Kathi Ludwig, MD;  Location: [facility];  Service: Urology;  Laterality: N/A;  . HERNIA REPAIR     inguinal  . INSERTION OF MESH N/A 11/24/2017   Procedure: INSERTION OF MESH;  Surgeon: Manus Rudd, MD;  Location: [facility];  Service: General;  Laterality: N/A;  . JOINT REPLACEMENT     '00-left/'08 RTHA  . KNEE ARTHROSCOPY     left knee meniscus surgery  . PROSTATE SURGERY Bilateral    7- 8 yrs ago  . SHOULDER ARTHROSCOPY Right   . SHOULDER ARTHROTOMY Left   .  TRANSURETHRAL RESECTION OF PROSTATE N/A 06/09/2013   Procedure: TRANSURETHRAL RESECTION OF THE PROSTATE (TURP) WITH GYRUS;  Surgeon: Kathi Ludwig, MD;  Location: [facility];  Service: Urology;  Laterality: N/A;  . UMBILICAL HERNIA REPAIR N/A 11/24/2017   Procedure: UMBILICAL HERNIA REPAIR WITH MESH;  Surgeon: Manus Rudd, MD;  Location: [facility];  Service: General;  Laterality: N/A;    MEDICATIONS: Current Outpatient Medications on File Prior to Visit  Medication Sig Dispense Refill  . amoxicillin (AMOXIL) 500 MG capsule Before dental procedures    . aspirin 81 MG EC tablet  Take 1 tablet (81 mg total) by mouth daily. Swallow whole. 30 tablet 12  . budesonide (PULMICORT) 180 MCG/ACT inhaler Inhale 1 puff into the lungs as needed.     . chlorthalidone (HYGROTON) 25 MG tablet TAKE 1 TABLET(25 MG) BY MOUTH DAILY 90 tablet 1  . Ginger, Zingiber officinalis, (GINGER ROOT) 550 MG CAPS Take 550 mg by mouth daily.    . hydrALAZINE (APRESOLINE) 100 MG tablet TAKE 1 TABLET(100 MG) BY MOUTH TWICE DAILY 180 tablet 1  . latanoprost (XALATAN) 0.005 % ophthalmic solution Place 1 drop into both eyes at bedtime.    . meclizine (ANTIVERT) 25 MG tablet Take 1 tablet (25 mg total) by mouth daily as needed for dizziness. 90 tablet 1  . meloxicam (MOBIC) 15 MG tablet Take 15 mg by mouth daily.    . Methylsulfonylmethane 1000 MG CAPS Take 2,000 mg by mouth daily.    . Misc Natural Products (TART CHERRY ADVANCED PO) Take 2 capsules by mouth daily.    . montelukast (SINGULAIR) 10 MG tablet Take 10 mg by mouth daily.   5  . SYSTANE ULTRA 0.4-0.3 % SOLN Apply 1 drop to eye 3 (three) times daily.    . traMADol (ULTRAM) 50 MG tablet Take 50 mg by mouth every 6 (six) hours as needed.    . TURMERIC PO Take 2 capsules by mouth daily.    Cliffton Asters Petrolatum-Mineral Oil (SYSTANE NIGHTTIME) OINT SMARTSIG:1 Inch(es) In Eye(s) Every Night     No current facility-administered medications on file prior to visit.    ALLERGIES: No Known Allergies  FAMILY HISTORY: Family History  Problem Relation Age of Onset  . Lung cancer Mother   . Heart attack Father    SOCIAL HISTORY: Social History   Socioeconomic History  . Marital status: Married    Spouse name: Not on file  . Number of children: 2  . Years of education: College gr  . Highest education level: Not on file  Occupational History  . Occupation: Scientist, research (medical)  Tobacco Use  . Smoking status: Never Smoker  . Smokeless tobacco: Never Used  Vaping Use  . Vaping Use: Never used  Substance and Sexual Activity  . Alcohol use: Yes     Comment: social 1-2 drinks weekly  . Drug use: No  . Sexual activity: Yes  Other Topics Concern  . Not on file  Social History Narrative  . Not on file   Social Determinants of Health   Financial Resource Strain:   . Difficulty of Paying Living Expenses: Not on file  Food Insecurity:   . Worried About Programme researcher, broadcasting/film/video in the Last Year: Not on file  . Ran Out of Food in the Last Year: Not on file  Transportation Needs:   . Lack of Transportation (Medical): Not on file  . Lack of Transportation (Non-Medical): Not on file  Physical Activity:   .  Days of Exercise per Week: Not on file  . Minutes of Exercise per Session: Not on file  Stress:   . Feeling of Stress : Not on file  Social Connections:   . Frequency of Communication with Friends and Family: Not on file  . Frequency of Social Gatherings with Friends and Family: Not on file  . Attends Religious Services: Not on file  . Active Member of Clubs or Organizations: Not on file  . Attends Banker Meetings: Not on file  . Marital Status: Not on file  Intimate Partner Violence:   . Fear of Current or Ex-Partner: Not on file  . Emotionally Abused: Not on file  . Physically Abused: Not on file  . Sexually Abused: Not on file    PHYSICAL EXAM:  Supine  Sitting  Standing BP 180/64  156/60  140/66 HR 62  67  68 General: No acute distress.  Patient appears well-groomed.  Head:  Normocephalic/atraumatic Eyes:  fundi examined but not visualized Neck: supple, no paraspinal tenderness, full range of motion Back: No paraspinal tenderness Heart: regular rate and rhythm Lungs: Clear to auscultation bilaterally. Vascular: No carotid bruits. Neurological Exam: Mental status: alert and oriented to person, place, and time, recent and remote memory intact, fund of knowledge intact, attention and concentration intact, speech fluent and not dysarthric, language intact. Cranial nerves: CN I: not tested CN II: pupils equal,  round and reactive to light, visual fields intact CN III, IV, VI:  full range of motion, no nystagmus, no ptosis CN V: facial sensation intact CN VII: upper and lower face symmetric CN VIII: hearing intact CN IX, X: gag intact, uvula midline CN XI: sternocleidomastoid and trapezius muscles intact CN XII: tongue midline Bulk & Tone: normal, no fasciculations. Motor:  5/5 throughout  Sensation: temperature and vibration sensation intact. Deep Tendon Reflexes:  2+ throughout,  toes downgoing.  Finger to nose testing:  Without dysmetria.   Heel to shin:  Without dysmetria.   Gait:  Normal station and stride.  Able to turn and tandem walk. Romberg negative.  IMPRESSION: Dizziness.  I think he is experiencing brief hypoxia related to his deviated septum and wearing the face mask.  Symptoms only occur while wearing the mask for prolonged period of time or sometimes while playing the trumpet. A primary neurologic or more specifically cerebrovascular etiology felt unllikely.  No lateralizing neurologic signs or symptoms.  Orthostatic vitals were significantly positive.  However, I question the accuracy as he only endorsed mild dizziness.  PLAN: 1.  He will undergo the septoplasty 2.  He will follow up several weeks afterwards for re-evaluation  Thank you for allowing me to take part in the care of this patient.  Shon Millet, DO  CC:  Azalee Course, [place]  Nadyne Coombes, MD

## 2019-11-10 ENCOUNTER — Encounter: Payer: Self-pay | Admitting: Neurology

## 2019-11-10 ENCOUNTER — Ambulatory Visit (INDEPENDENT_AMBULATORY_CARE_PROVIDER_SITE_OTHER): Payer: Medicare Other | Admitting: Neurology

## 2019-11-10 ENCOUNTER — Other Ambulatory Visit: Payer: Self-pay

## 2019-11-10 DIAGNOSIS — I2584 Coronary atherosclerosis due to calcified coronary lesion: Secondary | ICD-10-CM

## 2019-11-10 DIAGNOSIS — I2510 Atherosclerotic heart disease of native coronary artery without angina pectoris: Secondary | ICD-10-CM

## 2019-11-10 DIAGNOSIS — R42 Dizziness and giddiness: Secondary | ICD-10-CM | POA: Diagnosis not present

## 2019-11-10 NOTE — Patient Instructions (Signed)
I think the dizziness is related to decreased oxygen due to the deviated septum.  I have a VERY LOW suspicion of anything related to the brain or it's blood vessels.  Follow up after surgery for re-evaluation.

## 2019-11-30 ENCOUNTER — Ambulatory Visit: Payer: Medicare Other | Admitting: Physician Assistant

## 2019-12-08 ENCOUNTER — Encounter (INDEPENDENT_AMBULATORY_CARE_PROVIDER_SITE_OTHER): Payer: Self-pay

## 2019-12-10 ENCOUNTER — Telehealth (INDEPENDENT_AMBULATORY_CARE_PROVIDER_SITE_OTHER): Payer: Self-pay

## 2019-12-10 NOTE — Telephone Encounter (Signed)
The patient had left me a message last week to schedule an appointment.  I returned his call today and he wanted to know why I was calling him and I told him I was returning his call.  He said there must be a mistake.  He did call and he did leave me a message, I didn't make that up.  I told him to disregard the call and I would not bother him again.

## 2020-01-02 NOTE — Progress Notes (Deleted)
NEUROLOGY FOLLOW UP OFFICE NOTE  GERARDO CAIAZZO [id number]   Subjective:  Jonelle Sports. Gurski is a 74 year old right-handed male with HTN who follows up for dizziness.  UPDATE: He underwent septoplasty ***  HISTORY: He began having episodes of dizziness in late 2020-early 2021.  He describes it as both a spinning and lightheaded sensation.  Sometimes he may feel briefly confused for a moment.  Onset is acute and gradually dissipates.  It may last up to an hour and seems to occur only if he has been wearing his face mask for Covid-19 for an extended period of time or talking with the mask on.  It also may occur if he is playing his trumpet.  At first it was sporadic and would occur every couple of weeks.  Over the past 2 to 3 months, it has been occurring daily. No nausea, visual disturbance, numbness or weakness.  He underwent a cardiac evaluation.  EKG was unremarkable.  Echocardiogram showed EF 60-65% with Grade II diastolic dysfunction, moderate AR and trivial MR.  He is currently wearing a cardiac event monitor.  In 2019, he saw ENT and was told he had a deviated septum.  He recently followed up with ENT and was told that the deviated septum has significant worsened.  He is scheduled for septoplasty early next month.    Remote MRI of the brain on 05/03/2014 personally reviewed was normal.  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  Extracranial arteries were unremarkable.  PAST MEDICAL HISTORY: Past Medical History:  Diagnosis Date  . Arthritis    osteoarthritis-hips/ shoulders- no issues at present  . Complication of anesthesia    longer to wake up  . Foley catheter in place 05/30/2013   PT HAD  SURG 05/30/13 - CYSTO AND CLOT EVACUATION BLADDER / PROSTATE - UNABLE TO VOID AFTER DISCHARGE - CAME BACK TO ER SAME NIGHT AND HAD FOLEY INSERTED TO DRAINAGE BAG - URINE WAS BLOODY BUT PT REPORTS ON 06/07/13 THAT URINE IN BAG MOSTLY CLEAR NOW.  Marland Kitchen GERD (gastroesophageal reflux disease)   . History of kidney stones    x1  . Hypertension   . Muscle tension dysphonia     MEDICATIONS: Current Outpatient Medications on File Prior to Visit  Medication Sig Dispense Refill  . amoxicillin (AMOXIL) 500 MG capsule Before dental procedures    . aspirin 81 MG EC tablet Take 1 tablet (81 mg total) by mouth daily. Swallow whole. 30 tablet 12  . budesonide (PULMICORT) 180 MCG/ACT inhaler Inhale 1 puff into the lungs as needed.     . chlorthalidone (HYGROTON) 25 MG tablet TAKE 1 TABLET(25 MG) BY MOUTH DAILY 90 tablet 1  . Ginger, Zingiber officinalis, (GINGER ROOT) 550 MG CAPS Take 550 mg by mouth daily.    . hydrALAZINE (APRESOLINE) 100 MG tablet TAKE 1 TABLET(100 MG) BY MOUTH TWICE DAILY 180 tablet 1  . latanoprost (XALATAN) 0.005 % ophthalmic solution Place 1 drop into both eyes at bedtime.    . meclizine (ANTIVERT) 25 MG tablet Take 1 tablet (25 mg total) by mouth daily as needed for dizziness. 90 tablet 1  . meloxicam (MOBIC) 15 MG tablet Take 15 mg by mouth daily.    . Methylsulfonylmethane 1000  MG CAPS Take 2,000 mg by mouth daily.    . Misc Natural Products (TART CHERRY ADVANCED PO) Take 2 capsules by mouth daily.    . montelukast (SINGULAIR) 10 MG tablet Take 10 mg by mouth daily.   5  . SYSTANE ULTRA 0.4-0.3 % SOLN Apply 1 drop to eye 3 (three) times daily.    . traMADol (ULTRAM) 50 MG tablet Take 50 mg by mouth every 6 (six) hours as needed.    . TURMERIC PO Take 2 capsules by mouth daily.    Cliffton Asters Petrolatum-Mineral Oil (SYSTANE NIGHTTIME) OINT SMARTSIG:1 Inch(es) In Eye(s) Every Night     No current facility-administered medications on file prior to visit.    ALLERGIES: No Known  Allergies  FAMILY HISTORY: Family History  Problem Relation Age of Onset  . Lung cancer Mother   . Heart attack Father    ***.  SOCIAL HISTORY: Social History   Socioeconomic History  . Marital status: Married    Spouse name: Not on file  . Number of children: 2  . Years of education: College gr  . Highest education level: Not on file  Occupational History  . Occupation: Scientist, research (medical)  Tobacco Use  . Smoking status: Never Smoker  . Smokeless tobacco: Never Used  Vaping Use  . Vaping Use: Never used  Substance and Sexual Activity  . Alcohol use: Yes    Comment: social 1-2 drinks weekly  . Drug use: No  . Sexual activity: Yes  Other Topics Concern  . Not on file  Social History Narrative   Right handed               Social Determinants of Health   Financial Resource Strain: Not on file  Food Insecurity: Not on file  Transportation Needs: Not on file  Physical Activity: Not on file  Stress: Not on file  Social Connections: Not on file  Intimate Partner Violence: Not on file     Objective:  *** General: No acute distress.  Patient appears ***-groomed.   Head:  Normocephalic/atraumatic Eyes:  Fundi examined but not visualized Neck: supple, no paraspinal tenderness, full range of motion Heart:  Regular rate and rhythm Lungs:  Clear to auscultation bilaterally Back: No paraspinal tenderness Neurological Exam: alert and oriented to person, place, and time. Attention span and concentration intact, recent and remote memory intact, fund of knowledge intact.  Speech fluent and not dysarthric, language intact.  CN II-XII intact. Bulk and tone normal, muscle strength 5/5 throughout.  Sensation to light touch, temperature and vibration intact.  Deep tendon reflexes 2+ throughout, toes downgoing.  Finger to nose and heel to shin testing intact.  Gait normal, Romberg negative.   Assessment/Plan:   ***  Shon Millet, DO  CC: ***

## 2020-01-03 ENCOUNTER — Telehealth: Payer: Self-pay | Admitting: Physician Assistant

## 2020-01-03 ENCOUNTER — Telehealth: Payer: Self-pay

## 2020-01-03 ENCOUNTER — Ambulatory Visit: Payer: Medicare Other | Admitting: Neurology

## 2020-01-03 NOTE — Telephone Encounter (Signed)
°  Patient Consent for Virtual Visit         Kevin [name] has provided verbal consent on 01/03/2020 for a virtual visit (video or telephone).   CONSENT FOR VIRTUAL VISIT FOR:  Kevin [name]  By participating in this virtual visit I agree to the following:  I hereby voluntarily request, consent and authorize [facility] and its employed or contracted physicians, physician assistants, nurse practitioners or other licensed health care professionals (the Practitioner), to provide me with telemedicine health care services (the Services") as deemed necessary by the treating Practitioner. I acknowledge and consent to receive the Services by the Practitioner via telemedicine. I understand that the telemedicine visit will involve communicating with the Practitioner through live audiovisual communication technology and the disclosure of certain medical information by electronic transmission. I acknowledge that I have been given the opportunity to request an in-person assessment or other available alternative prior to the telemedicine visit and am voluntarily participating in the telemedicine visit.  I understand that I have the right to withhold or withdraw my consent to the use of telemedicine in the course of my care at any time, without affecting my right to future care or treatment, and that the Practitioner or I may terminate the telemedicine visit at any time. I understand that I have the right to inspect all information obtained and/or recorded in the course of the telemedicine visit and may receive copies of available information for a reasonable fee.  I understand that some of the potential risks of receiving the Services via telemedicine include:   Delay or interruption in medical evaluation due to technological equipment failure or disruption;  Information transmitted may not be sufficient (e.g. poor resolution of images) to allow for appropriate medical decision making by the  Practitioner; and/or   In rare instances, security protocols could fail, causing a breach of personal health information.  Furthermore, I acknowledge that it is my responsibility to provide information about my medical history, conditions and care that is complete and accurate to the best of my ability. I acknowledge that Practitioner's advice, recommendations, and/or decision may be based on factors not within their control, such as incomplete or inaccurate data provided by me or distortions of diagnostic images or specimens that may result from electronic transmissions. I understand that the practice of medicine is not an exact science and that Practitioner makes no warranties or guarantees regarding treatment outcomes. I acknowledge that a copy of this consent can be made available to me via my patient portal [facility] MyChart), or I can request a printed copy by calling the office of [facility].    I understand that my insurance will be billed for this visit.   I have read or had this consent read to me.  I understand the contents of this consent, which adequately explains the benefits and risks of the Services being provided via telemedicine.   I have been provided ample opportunity to ask questions regarding this consent and the Services and have had my questions answered to my satisfaction.  I give my informed consent for the services to be provided through the use of telemedicine in my medical care

## 2020-01-03 NOTE — Telephone Encounter (Signed)
Patient called and stated that he wanted to have a telephone visit instead of in office visit for his appt tomorrow at 2:45 pm. Please call to consult.

## 2020-01-03 NOTE — Telephone Encounter (Signed)
Routed to [employer] CMA covering [place] tomorrow

## 2020-01-03 NOTE — Telephone Encounter (Signed)
Called patient and informed him that Azalee Course, PA-C is okay with him switching is visit to virtual. Patient thanked me for calling and verbalized understanding about his virtual visit.

## 2020-01-04 ENCOUNTER — Telehealth: Payer: Medicare Other | Admitting: Physician Assistant

## 2020-01-04 ENCOUNTER — Encounter: Payer: Self-pay | Admitting: Physician Assistant

## 2020-01-04 ENCOUNTER — Telehealth (INDEPENDENT_AMBULATORY_CARE_PROVIDER_SITE_OTHER): Payer: Medicare Other | Admitting: Physician Assistant

## 2020-01-04 VITALS — BP 151/56 | HR 55 | Ht 69.0 in | Wt 200.0 lb

## 2020-01-04 DIAGNOSIS — R42 Dizziness and giddiness: Secondary | ICD-10-CM | POA: Diagnosis not present

## 2020-01-04 DIAGNOSIS — I10 Essential (primary) hypertension: Secondary | ICD-10-CM

## 2020-01-04 DIAGNOSIS — I2510 Atherosclerotic heart disease of native coronary artery without angina pectoris: Secondary | ICD-10-CM

## 2020-01-04 DIAGNOSIS — I2584 Coronary atherosclerosis due to calcified coronary lesion: Secondary | ICD-10-CM | POA: Diagnosis not present

## 2020-01-04 MED ORDER — HYDRALAZINE HCL 100 MG PO TABS
100.0000 mg | ORAL_TABLET | Freq: Three times a day (TID) | ORAL | 3 refills | Status: DC
Start: 2020-01-04 — End: 2020-12-30

## 2020-01-04 NOTE — Patient Instructions (Signed)
Medication Instructions:   INCREASE Hydralazine to 100 mg 3 times a day  *If you need a refill on your cardiac medications before your next appointment, please call your pharmacy*  Lab Work: NONE ordered at this time of appointment   If you have labs (blood work) drawn today and your tests are completely normal, you will receive your results only by: Marland Kitchen MyChart Message (if you have MyChart) OR . A paper copy in the mail If you have any lab test that is abnormal or we need to change your treatment, we will call you to review the results.  Testing/Procedures: NONE ordered at this time of appointment   Follow-Up: At [facility], you and your health needs are our priority.  As part of our continuing mission to provide you with exceptional heart care, we have created designated Provider Care Teams.  These Care Teams include your primary Cardiologist (physician) and Advanced Practice Providers (APPs -  Physician Assistants and Nurse Practitioners) who all work together to provide you with the care you need, when you need it.  Your next appointment:   6 month(s)  The format for your next appointment:   In Person  Provider:   Nanetta Batty, MD  Other Instructions

## 2020-01-04 NOTE — Progress Notes (Signed)
Virtual Visit via Telephone Note   This visit type was conducted due to national recommendations for restrictions regarding the COVID-19 Pandemic (e.g. social distancing) in an effort to limit this patient's exposure and mitigate transmission in our community.  Due to his co-morbid illnesses, this patient is at least at moderate risk for complications without adequate follow up.  This format is felt to be most appropriate for this patient at this time.  The patient did not have access to video technology/had technical difficulties with video requiring transitioning to audio format only (telephone).  All issues noted in this document were discussed and addressed.  No physical exam could be performed with this format.  Please refer to the patient's chart for his  consent to telehealth for [facility].    Date:  01/04/2020   ID:  Kevin [name], DOB [birth year], MRN [id number] The patient was identified using 2 identifiers.  Patient Location: Home Provider Location: Office/Clinic  PCP:  Dois Davenport, MD  Cardiologist:  Nanetta Batty, MD  Electrophysiologist:  None   Evaluation Performed:  Follow-Up Visit  Chief Complaint:  Follow up  History of Present Illness:    Kevin [name] is a 74 y.o. male with hypertension and GERD. Previous echocardiogram obtained on 04/26/2017 showed EF 60 to 65%, grade 2 DD, moderate AI. Coronary calcium score obtained in April 2019 was 537. Myoview obtained on 06/22/2017 was normal. Patient was last seen by Dr. Allyson Sabal via virtual visit in May 2020 at which time he complained of dizziness while under a lot of stress due to family issue.  I saw the patient in September and October, his dizziness has become more prolonged.  Whereas previous dizzy spell last year last summer between 15 to 20 minutes, now his dizziness can last up to 3 hours.  During the dizzy spell, he usually feels the room is spinning.  I recommended a trial of meclizine and repeat  echocardiogram to make sure his aortic valve is not getting any worse.  Echocardiogram obtained on 10/13/2019 showed EF 60 to 65%, grade 2 DD, mild dilatation of the aortic root measuring at 40 mm.  Heart monitor obtained in October showed rare short runs of SVT, however mostly sinus rhythm with occasional PACs and PVCs.  I previously referred the patient to neurology service as well and he was seen by Dr. Everlena Cooper on 11/10/2019.  Neurologist felt his dizziness may be related to decreased oxygen level related to deviated septum.  He is planning to undergo septoplasty and will be evaluated afterward.  Patient presents today for virtual visit.  After septoplasty, his dizzy spell has significantly improved.  We reviewed recent echocardiogram and heart monitor, at this time I do not recommend any further work-up.  He denies any significant chest pain or shortness of breath at this time.  He can follow-up with Dr. Allyson Sabal in 6 months.  Repeat blood pressure remains elevated, I will increase his hydralazine to 100 mg 3 times daily dosing  The patient does not have symptoms concerning for COVID-19 infection (fever, chills, cough, or new shortness of breath).    Past Medical History:  Diagnosis Date  . Arthritis    osteoarthritis-hips/ shoulders- no issues at present  . Complication of anesthesia    longer to wake up  . Foley catheter in place 05/30/2013   PT HAD SURG 05/30/13 - CYSTO AND CLOT EVACUATION BLADDER / PROSTATE - UNABLE TO VOID AFTER DISCHARGE - CAME BACK TO ER SAME NIGHT  AND HAD FOLEY INSERTED TO DRAINAGE BAG - URINE WAS BLOODY BUT PT REPORTS ON 06/07/13 THAT URINE IN BAG MOSTLY CLEAR NOW.  Marland Kitchen GERD (gastroesophageal reflux disease)   . History of kidney stones    x1  . Hypertension   . Muscle tension dysphonia    Past Surgical History:  Procedure Laterality Date  . BACK SURGERY     lumbar   . CYSTOSCOPY W/ URETEROSCOPY W/ LITHOTRIPSY    . CYSTOSCOPY/RETROGRADE/URETEROSCOPY/STONE EXTRACTION WITH  BASKET N/A 05/30/2013   Procedure: CYSTOSCOPY , BLADDER BIOPSY AND CLOT EVACUATION , CAUTERIZATIO OF LATERAL RIGHT AND LEFT LOBE OF PROSTATE;  Surgeon: Kathi Ludwig, MD;  Location: [facility];  Service: Urology;  Laterality: N/A;  . HERNIA REPAIR     inguinal  . INSERTION OF MESH N/A 11/24/2017   Procedure: INSERTION OF MESH;  Surgeon: Manus Rudd, MD;  Location: [facility];  Service: General;  Laterality: N/A;  . JOINT REPLACEMENT     '00-left/'08 RTHA  . KNEE ARTHROSCOPY     left knee meniscus surgery  . PROSTATE SURGERY Bilateral    7- 8 yrs ago  . SHOULDER ARTHROSCOPY Right   . SHOULDER ARTHROTOMY Left   . TRANSURETHRAL RESECTION OF PROSTATE N/A 06/09/2013   Procedure: TRANSURETHRAL RESECTION OF THE PROSTATE (TURP) WITH GYRUS;  Surgeon: Kathi Ludwig, MD;  Location: [facility];  Service: Urology;  Laterality: N/A;  . UMBILICAL HERNIA REPAIR N/A 11/24/2017   Procedure: UMBILICAL HERNIA REPAIR WITH MESH;  Surgeon: Manus Rudd, MD;  Location: [facility];  Service: General;  Laterality: N/A;     Current Meds  Medication Sig  . amoxicillin (AMOXIL) 500 MG capsule Before dental procedures  . aspirin 81 MG EC tablet Take 1 tablet (81 mg total) by mouth daily. Swallow whole.  . budesonide (PULMICORT) 180 MCG/ACT inhaler Inhale 1 puff into the lungs as needed.   . chlorthalidone (HYGROTON) 25 MG tablet TAKE 1 TABLET(25 MG) BY MOUTH DAILY  . Ginger, Zingiber officinalis, (GINGER ROOT) 550 MG CAPS Take 550 mg by mouth daily.  . hydrALAZINE (APRESOLINE) 100 MG tablet TAKE 1 TABLET(100 MG) BY MOUTH TWICE DAILY  . Ibuprofen-Acetaminophen (ADVIL DUAL ACTION PO) Take 2 capsules by mouth.  . latanoprost (XALATAN) 0.005 % ophthalmic solution Place 1 drop into both eyes at bedtime.  . Methylsulfonylmethane 1000 MG CAPS Take 2,000 mg by mouth daily.  . Misc Natural Products (TART CHERRY ADVANCED PO) Take 2 capsules by mouth daily.  . montelukast (SINGULAIR) 10 MG tablet Take 10 mg by mouth daily.   .  SYSTANE ULTRA 0.4-0.3 % SOLN Apply 1 drop to eye 3 (three) times daily.  . traMADol (ULTRAM) 50 MG tablet Take 50 mg by mouth every 6 (six) hours as needed.  . TURMERIC PO Take 2 capsules by mouth daily.  Cliffton Asters Petrolatum-Mineral Oil (SYSTANE NIGHTTIME) OINT SMARTSIG:1 Inch(es) In Eye(s) Every Night     Allergies:   Patient has no known allergies.   Social History   Tobacco Use  . Smoking status: Never Smoker  . Smokeless tobacco: Never Used  Vaping Use  . Vaping Use: Never used  Substance Use Topics  . Alcohol use: Yes    Comment: social 1-2 drinks weekly  . Drug use: No     Family Hx: The patient's family history includes Heart attack in his father; Lung cancer in his mother.  ROS:   Please see the history of present illness.     All other systems reviewed  and are negative.   Prior CV studies:   The following studies were reviewed today:  Echo 10/13/2019 1. Left ventricular ejection fraction, by estimation, is 60 to 65%. The  left ventricle has normal function. The left ventricle has no regional  wall motion abnormalities. There is mild left ventricular hypertrophy.  Left ventricular diastolic parameters  are consistent with Grade II diastolic dysfunction (pseudonormalization).  2. Right ventricular systolic function is normal. The right ventricular  size is normal. There is normal pulmonary artery systolic pressure. The  estimated right ventricular systolic pressure is 29.8 mmHg.  3. Left atrial size was mildly dilated.  4. The mitral valve is normal in structure. Trivial mitral valve  regurgitation. No evidence of mitral stenosis.  5. The aortic valve is tricuspid. Aortic valve regurgitation is moderate,  eccentric. No holodiastolic flow reversal in the descending thoracic  aorta. No aortic stenosis is present.  6. Aortic dilatation noted. There is mild dilatation of the aortic root,  measuring 40 mm.  7. The inferior vena cava is normal in size with  greater than 50%  respiratory variability, suggesting right atrial pressure of 3 mmHg.   Labs/Other Tests and Data Reviewed:    EKG:  An ECG dated 09/28/2019 was personally reviewed today and demonstrated:  Sinus rhythm with left bundle branch block.  Recent Labs: No results found for requested labs within last 8760 hours.   Recent Lipid Panel No results found for: CHOL, TRIG, HDL, CHOLHDL, LDLCALC, LDLDIRECT  Wt Readings from Last 3 Encounters:  01/04/20 200 lb (90.7 kg)  10/30/19 206 lb (93.4 kg)  10/24/19 207 lb 6.4 oz (94.1 kg)     Risk Assessment/Calculations:      Objective:    Vital Signs:  BP (!) 151/56   Pulse (!) 55   Ht 5\' 9"  (1.753 m)   Wt 200 lb (90.7 kg)   BMI 29.53 kg/m    VITAL SIGNS:  reviewed  ASSESSMENT & PLAN:    1. Dizziness: Symptom has significantly improved after he went through with surgery to correct deviated septum.  Previous dizziness is likely related to decreased oxygen level related to deviated septum.  No further work-up at this point  2. Hypertension: Blood pressure elevated, increase hydralazine to 100 mg 3 times daily dosing.        COVID-19 Education: The signs and symptoms of COVID-19 were discussed with the patient and how to seek care for testing (follow up with PCP or arrange E-visit).  The importance of social distancing was discussed today.  Time:   Today, I have spent 5 minutes with the patient with telehealth technology discussing the above problems.     Medication Adjustments/Labs and Tests Ordered: Current medicines are reviewed at length with the patient today.  Concerns regarding medicines are outlined above.   Tests Ordered: No orders of the defined types were placed in this encounter.   Medication Changes: No orders of the defined types were placed in this encounter.   Follow Up:  In Person in 6 month(s)  Signed, , Azalee Course  01/04/2020 1:26 PM    [facility]

## 2020-01-09 ENCOUNTER — Ambulatory Visit: Payer: Medicare Other | Admitting: Neurology

## 2020-01-17 ENCOUNTER — Other Ambulatory Visit: Payer: Self-pay | Admitting: Pharmacist Clinician (PhC)/ Clinical Pharmacy Specialist

## 2020-01-17 MED ORDER — CHLORTHALIDONE 25 MG PO TABS
25.0000 mg | ORAL_TABLET | Freq: Every day | ORAL | 3 refills | Status: DC
Start: 2020-01-17 — End: 2021-01-07

## 2020-06-19 ENCOUNTER — Ambulatory Visit (INDEPENDENT_AMBULATORY_CARE_PROVIDER_SITE_OTHER): Payer: Medicare Other | Admitting: Cardiovascular Disease

## 2020-06-19 ENCOUNTER — Encounter: Payer: Self-pay | Admitting: Cardiovascular Disease

## 2020-06-19 ENCOUNTER — Other Ambulatory Visit: Payer: Self-pay

## 2020-06-19 VITALS — BP 140/54 | HR 61 | Ht 69.0 in | Wt 200.0 lb

## 2020-06-19 DIAGNOSIS — I2584 Coronary atherosclerosis due to calcified coronary lesion: Secondary | ICD-10-CM

## 2020-06-19 DIAGNOSIS — I2510 Atherosclerotic heart disease of native coronary artery without angina pectoris: Secondary | ICD-10-CM

## 2020-06-19 DIAGNOSIS — I351 Nonrheumatic aortic (valve) insufficiency: Secondary | ICD-10-CM

## 2020-06-19 DIAGNOSIS — I10 Essential (primary) hypertension: Secondary | ICD-10-CM

## 2020-06-19 NOTE — Patient Instructions (Addendum)
Medication Instructions:  Your physician recommends that you continue on your current medications as directed. Please refer to the Current Medication list given to you today.  *If you need a refill on your cardiac medications before your next appointment, please call your pharmacy*  Testing/Procedures: Your physician has requested that you have an echocardiogram. Echocardiography is a painless test that uses sound waves to create images of your heart. It provides your doctor with information about the size and shape of your heart and how well your heart's chambers and valves are working. This procedure takes approximately one hour. There are no restrictions for this procedure. This procedure is done at [address]. To be done Sept 2022   Follow-Up: At [facility], you and your health needs are our priority.  As part of our continuing mission to provide you with exceptional heart care, we have created designated Provider Care Teams.  These Care Teams include your primary Cardiologist (physician) and Advanced Practice Providers (APPs -  Physician Assistants and Nurse Practitioners) who all work together to provide you with the care you need, when you need it.  We recommend signing up for the patient portal called "MyChart".  Sign up information is provided on this After Visit Summary.  MyChart is used to connect with patients for Virtual Visits (Telemedicine).  Patients are able to view lab/test results, encounter notes, upcoming appointments, etc.  Non-urgent messages can be sent to your provider as well.   To learn more about what you can do with MyChart, go to [email or web address].    Your next appointment:   12 month(s)  The format for your next appointment:   In Person  Provider:   Nanetta Batty, MD    Other Instructions [employer] Blood pressure cuff.

## 2020-06-19 NOTE — Assessment & Plan Note (Signed)
History of essential hypertension a blood pressure measured today at 140/54.  He is on chlorthalidone and hydralazine.

## 2020-06-19 NOTE — Assessment & Plan Note (Signed)
History of moderate aortic insufficiency by 2D echo performed 10/13/2019 with normal LV systolic size and function and mild LVH with grade 2 diastolic dysfunction.  His ascending aorta measured 40 mm as well.  We will recheck a 2D echo in 1 year.  He is otherwise asymptomatic.

## 2020-06-19 NOTE — Progress Notes (Signed)
06/19/2020 Kevin [name]   February 12, 1945  [id number]  Primary Physician Pcp, No Primary Cardiologist: Lorretta Harp MD Kevin [name], [place]  HPI:  Kevin [name] is a 75 y.o. mildly overweight married Caucasian male who works at [employer]. I last spoke to him on the phone for a virtual telemedicine phone visit 06/03/2018.Marland KitchenHe has a history of hypertension on ACE inhibitor as in the past. I last saw him in the office 09/26/14. His history otherwise is remarkable for multiple joint replacements as well as prostate resection because of prostate surgery, with ureteral complications. His ACE inhibitor was recently changed to an ARB (losartan) because of a hoarse voice. He probably needs vocal cord surgery next week. He was seen at [facility] today for preoperative evaluation with blood pressure was measured multiple times in the 190/90 range. Since I saw him in the office we have been titrating his blood pressure medicines to the point now that his blood pressure is much better controlled in the 130/6070 range. He currently is on hydralazine, Benicar andamlodipine. He denies chest pain or shortness of breath. He did have some vague ocular phenomenon for which he saw his ophthalmologist. 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 I referred him to Dr. Leonie Man for neurologic evaluation. An MRI/MRA was unremarkable. His blood pressure has been under much better control on his current medical regimen. He follows his blood pressure as an outpatient at home. He has had issues with morning mucus production and coughing/gagging for the last 12 months or so. This has been evaluated by ENT, gastric neurology and allergy. There has been no uniform consensus on the etiology. This mucus production has somewhat improved over last several months. He is otherwise asymptomatic with excellent blood pressure measurements at home on minimal medications.  Since I spoke to him on the phone 2 years ago he  continues to do well.  He gets occasional episodes of dizziness and tingling in his fingers and toes.  He has not traveled in over 2 years and is somewhat hesitant because of COVID.  He denies chest pain or shortness of breath.  He does do an hour and a half of meditation and yoga in the morning and takes private tai chi lessons.  He is also a grandfather to new grandchild and is volunteering at [employer].    Current Meds  Medication Sig  . amoxicillin (AMOXIL) 500 MG capsule Before dental procedures  . aspirin 81 MG EC tablet Take 1 tablet (81 mg total) by mouth daily. Swallow whole.  . budesonide (PULMICORT) 180 MCG/ACT inhaler Inhale 1 puff into the lungs as needed.   . chlorthalidone (HYGROTON) 25 MG tablet Take 1 tablet (25 mg total) by mouth daily.  . hydrALAZINE (APRESOLINE) 100 MG tablet Take 1 tablet (100 mg total) by mouth 3 (three) times daily.  . Ibuprofen-Acetaminophen (ADVIL DUAL ACTION PO) Take 2 capsules by mouth.  . latanoprost (XALATAN) 0.005 % ophthalmic solution Place 1 drop into both eyes at bedtime.  . Methylsulfonylmethane 1000 MG CAPS Take 2,000 mg by mouth daily.  . Misc Natural Products (TART CHERRY ADVANCED PO) Take 2 capsules by mouth daily.  . montelukast (SINGULAIR) 10 MG tablet Take 10 mg by mouth daily.   . SYSTANE ULTRA 0.4-0.3 % SOLN Apply 1 drop to eye 3 (three) times daily.  . traMADol (ULTRAM) 50 MG tablet Take 50 mg by mouth every 6 (six) hours as needed.  . TURMERIC PO  Take 2 capsules by mouth daily.  Dema Severin Petrolatum-Mineral Oil (SYSTANE NIGHTTIME) OINT SMARTSIG:1 Inch(es) In Eye(s) Every Night  . [DISCONTINUED] Ginger, Zingiber officinalis, (GINGER ROOT) 550 MG CAPS Take 550 mg by mouth daily.     No Known Allergies  Social History   Socioeconomic History  . Marital status: Married    Spouse name: Not on file  . Number of children: 2  . Years of education: College gr  . Highest education level: Not on file  Occupational History  .  Occupation: Lobbyist  Tobacco Use  . Smoking status: Never Smoker  . Smokeless tobacco: Never Used  Vaping Use  . Vaping Use: Never used  Substance and Sexual Activity  . Alcohol use: Yes    Comment: social 1-2 drinks weekly  . Drug use: No  . Sexual activity: Yes  Other Topics Concern  . Not on file  Social History Narrative   Right handed               Social Determinants of Health   Financial Resource Strain: Not on file  Food Insecurity: Not on file  Transportation Needs: Not on file  Physical Activity: Not on file  Stress: Not on file  Social Connections: Not on file  Intimate Partner Violence: Not on file     Review of Systems: General: negative for chills, fever, night sweats or weight changes.  Cardiovascular: negative for chest pain, dyspnea on exertion, edema, orthopnea, palpitations, paroxysmal nocturnal dyspnea or shortness of breath Dermatological: negative for rash Respiratory: negative for cough or wheezing Urologic: negative for hematuria Abdominal: negative for nausea, vomiting, diarrhea, bright red blood per rectum, melena, or hematemesis Neurologic: negative for visual changes, syncope, or dizziness All other systems reviewed and are otherwise negative except as noted above.    Blood pressure (!) 140/54, pulse 61, height 5' 9"  (1.753 m), weight 200 lb (90.7 kg).  General appearance: alert and no distress Neck: no adenopathy, no carotid bruit, no JVD, supple, symmetrical, trachea midline and thyroid not enlarged, symmetric, no tenderness/mass/nodules Lungs: clear to auscultation bilaterally Heart: regular rate and rhythm, S1, S2 normal, no murmur, click, rub or gallop Extremities: extremities normal, atraumatic, no cyanosis or edema Pulses: 2+ and symmetric Skin: Skin color, texture, turgor normal. No rashes or lesions Neurologic: Alert and oriented X 3, normal strength and tone. Normal symmetric reflexes. Normal coordination and gait  EKG  sinus rhythm at 61 with LVH, left axis deviation and nonspecific ST and T wave changes.  I personally reviewed this EKG.  ASSESSMENT AND PLAN:   Essential hypertension History of essential hypertension a blood pressure measured today at 140/54.  He is on chlorthalidone and hydralazine.  Moderate aortic insufficiency History of moderate aortic insufficiency by 2D echo performed 10/13/2019 with normal LV systolic size and function and mild LVH with grade 2 diastolic dysfunction.  His ascending aorta measured 40 mm as well.  We will recheck a 2D echo in 1 year.  He is otherwise asymptomatic.      Lorretta Harp MD FACP,FACC,FAHA, [facility] 06/19/2020 12:13 PM

## 2020-10-31 ENCOUNTER — Other Ambulatory Visit: Payer: Self-pay

## 2020-10-31 ENCOUNTER — Ambulatory Visit (HOSPITAL_COMMUNITY): Payer: Medicare Other | Attending: Cardiology

## 2020-10-31 DIAGNOSIS — I10 Essential (primary) hypertension: Secondary | ICD-10-CM | POA: Insufficient documentation

## 2020-10-31 DIAGNOSIS — I2584 Coronary atherosclerosis due to calcified coronary lesion: Secondary | ICD-10-CM | POA: Insufficient documentation

## 2020-10-31 DIAGNOSIS — I351 Nonrheumatic aortic (valve) insufficiency: Secondary | ICD-10-CM | POA: Diagnosis present

## 2020-10-31 DIAGNOSIS — I2510 Atherosclerotic heart disease of native coronary artery without angina pectoris: Secondary | ICD-10-CM

## 2020-10-31 LAB — ECHOCARDIOGRAM COMPLETE
Area-P 1/2: 3.27 cm2
P 1/2 time: 305 msec
S' Lateral: 3.3 cm

## 2020-11-15 ENCOUNTER — Telehealth: Payer: Self-pay | Admitting: Cardiovascular Disease

## 2020-11-15 NOTE — Telephone Encounter (Signed)
Spoke with pt. Office visit scheduled per Dr. Allyson Sabal to discuss recent echo. Pt verbalizes understanding.

## 2020-11-15 NOTE — Telephone Encounter (Signed)
Pt is returning call for Echo results 

## 2020-11-19 ENCOUNTER — Encounter: Payer: Self-pay | Admitting: Cardiovascular Disease

## 2020-11-19 ENCOUNTER — Other Ambulatory Visit: Payer: Self-pay

## 2020-11-19 ENCOUNTER — Ambulatory Visit (INDEPENDENT_AMBULATORY_CARE_PROVIDER_SITE_OTHER): Payer: Medicare Other | Admitting: Cardiovascular Disease

## 2020-11-19 VITALS — BP 150/62 | HR 76 | Ht 69.0 in | Wt 205.4 lb

## 2020-11-19 DIAGNOSIS — I2510 Atherosclerotic heart disease of native coronary artery without angina pectoris: Secondary | ICD-10-CM

## 2020-11-19 DIAGNOSIS — I2584 Coronary atherosclerosis due to calcified coronary lesion: Secondary | ICD-10-CM | POA: Diagnosis not present

## 2020-11-19 DIAGNOSIS — I351 Nonrheumatic aortic (valve) insufficiency: Secondary | ICD-10-CM | POA: Diagnosis not present

## 2020-11-19 MED ORDER — AMLODIPINE BESYLATE 2.5 MG PO TABS: 2.5000 mg | ORAL_TABLET | Freq: Every day | ORAL | 1 refills | Status: DC

## 2020-11-19 NOTE — Patient Instructions (Addendum)
Medication Instructions:   -Start taking amlodipine (norvasc) 2.5mg  once daily.  *If you need a refill on your cardiac medications before your next appointment, please call your pharmacy*   Testing/Procedures: Your physician has requested that you have an echocardiogram. Echocardiography is a painless test that uses sound waves to create images of your heart. It provides your doctor with information about the size and shape of your heart and how well your heart's chambers and valves are working. This procedure takes approximately one hour. There are no restrictions for this procedure. To be done in April 2023. This procedure is done at [address].    Follow-Up: At [facility], you and your health needs are our priority.  As part of our continuing mission to provide you with exceptional heart care, we have created designated Provider Care Teams.  These Care Teams include your primary Cardiologist (physician) and Advanced Practice Providers (APPs -  Physician Assistants and Nurse Practitioners) who all work together to provide you with the care you need, when you need it.  We recommend signing up for the patient portal called "MyChart".  Sign up information is provided on this After Visit Summary.  MyChart is used to connect with patients for Virtual Visits (Telemedicine).  Patients are able to view lab/test results, encounter notes, upcoming appointments, etc.  Non-urgent messages can be sent to your provider as well.   To learn more about what you can do with MyChart, go to [email or web address].    Your next appointment:   6 month(s)  The format for your next appointment:   In Person  Provider:   Nanetta Batty, MD   Other Instructions Dr. Allyson Sabal has requested that you schedule an appointment with one of our clinical pharmacists for a blood pressure check appointment within the next 4 weeks.  If you monitor your blood pressure (BP) at home, please bring your BP cuff and your  BP readings with you to this appointment  HOW TO TAKE YOUR BLOOD PRESSURE: Rest 5 minutes before taking your blood pressure. Don't smoke or drink caffeinated beverages for at least 30 minutes before. Take your blood pressure before (not after) you eat. Sit comfortably with your back supported and both feet on the floor (don't cross your legs). Elevate your arm to heart level on a table or a desk. Use the proper sized cuff. It should fit smoothly and snugly around your bare upper arm. There should be enough room to slip a fingertip under the cuff. The bottom edge of the cuff should be 1 inch above the crease of the elbow. Ideally, take 3 measurements at one sitting and record the average.

## 2020-11-19 NOTE — Progress Notes (Signed)
Mr. Mccaughan returns today for follow-up of his 2D echo performed 10/31/2020.  He has normal LV systolic function, moderate to severe AI with an increase in his end-diastolic dimension from 45 to 53 mm.  His systolic dimension is relatively normal and only increased 1 mm.  He is completely asymptomatic and is very active.  He also says his blood pressures been trending up.  He is on chlorthalidone and hydralazine.  I am going to add back low-dose amlodipine and have him keep a 30-day blood pressure log and we will schedule him to see a Pharm.D. in 4 weeks.  I am going to recheck a 2D echo in 6 months.  Runell Gess, M.D., FACP, [facility], [facility] [address] [place], [place]  [address]  [phone] 11/19/2020 2:52 PM

## 2020-12-02 ENCOUNTER — Emergency Department (HOSPITAL_COMMUNITY): Payer: Medicare Other

## 2020-12-02 ENCOUNTER — Telehealth: Payer: Self-pay | Admitting: Pharmacist Clinician (PhC)/ Clinical Pharmacy Specialist

## 2020-12-02 ENCOUNTER — Encounter (HOSPITAL_BASED_OUTPATIENT_CLINIC_OR_DEPARTMENT_OTHER): Payer: Self-pay | Admitting: Emergency Medicine

## 2020-12-02 ENCOUNTER — Emergency Department (HOSPITAL_BASED_OUTPATIENT_CLINIC_OR_DEPARTMENT_OTHER)
Admission: EM | Admit: 2020-12-02 | Discharge: 2020-12-02 | Disposition: A | Discharge status: Home / Self Care | Payer: Medicare Other | Attending: Emergency Medicine | Admitting: Emergency Medicine

## 2020-12-02 ENCOUNTER — Other Ambulatory Visit: Payer: Self-pay

## 2020-12-02 ENCOUNTER — Emergency Department (HOSPITAL_BASED_OUTPATIENT_CLINIC_OR_DEPARTMENT_OTHER): Payer: Medicare Other

## 2020-12-02 DIAGNOSIS — I10 Essential (primary) hypertension: Secondary | ICD-10-CM | POA: Diagnosis not present

## 2020-12-02 DIAGNOSIS — Z96652 Presence of left artificial knee joint: Secondary | ICD-10-CM | POA: Diagnosis not present

## 2020-12-02 DIAGNOSIS — Z20822 Contact with and (suspected) exposure to covid-19: Secondary | ICD-10-CM | POA: Insufficient documentation

## 2020-12-02 DIAGNOSIS — R42 Dizziness and giddiness: Secondary | ICD-10-CM | POA: Insufficient documentation

## 2020-12-02 DIAGNOSIS — R791 Abnormal coagulation profile: Secondary | ICD-10-CM | POA: Diagnosis not present

## 2020-12-02 DIAGNOSIS — Z7982 Long term (current) use of aspirin: Secondary | ICD-10-CM | POA: Insufficient documentation

## 2020-12-02 DIAGNOSIS — Z96612 Presence of left artificial shoulder joint: Secondary | ICD-10-CM | POA: Insufficient documentation

## 2020-12-02 DIAGNOSIS — Z79899 Other long term (current) drug therapy: Secondary | ICD-10-CM | POA: Diagnosis not present

## 2020-12-02 DIAGNOSIS — Z96611 Presence of right artificial shoulder joint: Secondary | ICD-10-CM | POA: Insufficient documentation

## 2020-12-02 LAB — COMPREHENSIVE METABOLIC PANEL
ALT: 32 U/L (ref 0–44)
AST: 27 U/L (ref 15–41)
Albumin: 4.3 g/dL (ref 3.5–5.0)
Alkaline Phosphatase: 46 U/L (ref 38–126)
Anion gap: 10 (ref 5–15)
BUN: 20 mg/dL (ref 8–23)
CO2: 29 mmol/L (ref 22–32)
Calcium: 9.4 mg/dL (ref 8.9–10.3)
Chloride: 101 mmol/L (ref 98–111)
Creatinine, Ser: 0.94 mg/dL (ref 0.61–1.24)
GFR, Estimated: >60 mL/min (ref >60)
Glucose, Bld: 118 mg/dL — ABNORMAL HIGH (ref 70–99)
Potassium: 3.4 mmol/L — ABNORMAL LOW (ref 3.5–5.1)
Sodium: 140 mmol/L (ref 135–145)
Total Bilirubin: 1 mg/dL (ref 0.3–1.2)
Total Protein: 6.8 g/dL (ref 6.5–8.1)

## 2020-12-02 LAB — DIFFERENTIAL
Abs Immature Granulocytes: 0.04 10*3/uL (ref 0.00–0.07)
Basophils Absolute: 0 10*3/uL (ref 0.0–0.1)
Basophils Relative: 1 %
Eosinophils Absolute: 0.1 10*3/uL (ref 0.0–0.5)
Eosinophils Relative: 2 %
Immature Granulocytes: 1 %
Lymphocytes Relative: 16 %
Lymphs Abs: 0.8 10*3/uL (ref 0.7–4.0)
Monocytes Absolute: 0.4 10*3/uL (ref 0.1–1.0)
Monocytes Relative: 8 %
Neutro Abs: 3.5 10*3/uL (ref 1.7–7.7)
Neutrophils Relative %: 72 %

## 2020-12-02 LAB — RESP PANEL BY RT-PCR (FLU A&B, COVID) ARPGX2
Influenza A by PCR: NEGATIVE
Influenza B by PCR: NEGATIVE
SARS Coronavirus 2 by RT PCR: NEGATIVE

## 2020-12-02 LAB — URINALYSIS, ROUTINE W REFLEX MICROSCOPIC
Bilirubin Urine: NEGATIVE
Glucose, UA: NEGATIVE mg/dL
Hgb urine dipstick: NEGATIVE
Ketones, ur: NEGATIVE mg/dL
Leukocytes,Ua: NEGATIVE
Nitrite: NEGATIVE
Specific Gravity, Urine: 1.016 (ref 1.005–1.030)
pH: 7 (ref 5.0–8.0)

## 2020-12-02 LAB — PROTIME-INR
INR: 0.9 (ref 0.8–1.2)
Prothrombin Time: 12.3 seconds (ref 11.4–15.2)

## 2020-12-02 LAB — CBC
HCT: 45.1 % (ref 39.0–52.0)
Hemoglobin: 15.7 g/dL (ref 13.0–17.0)
MCH: 30.1 pg (ref 26.0–34.0)
MCHC: 34.8 g/dL (ref 30.0–36.0)
MCV: 86.6 fL (ref 80.0–100.0)
Platelets: 134 10*3/uL — ABNORMAL LOW (ref 150–400)
RBC: 5.21 MIL/uL (ref 4.22–5.81)
RDW: 12.9 % (ref 11.5–15.5)
WBC: 4.8 10*3/uL (ref 4.0–10.5)
nRBC: 0 % (ref 0.0–0.2)

## 2020-12-02 LAB — RAPID URINE DRUG SCREEN, HOSP PERFORMED
Amphetamines: NOT DETECTED
Barbiturates: NOT DETECTED
Benzodiazepines: NOT DETECTED
Cocaine: NOT DETECTED
Opiates: NOT DETECTED
Tetrahydrocannabinol: POSITIVE — AB

## 2020-12-02 LAB — APTT: aPTT: 33 seconds (ref 24–36)

## 2020-12-02 MED ORDER — HYDRALAZINE HCL 25 MG PO TABS
100.0000 mg | ORAL_TABLET | Freq: Once | ORAL | Status: AC
  Administered 2020-12-02: 100 mg via ORAL
  Filled 2020-12-02: qty 4

## 2020-12-02 MED ORDER — MECLIZINE HCL 25 MG PO TABS
25.0000 mg | ORAL_TABLET | Freq: Once | ORAL | Status: AC
  Administered 2020-12-02: 25 mg via ORAL
  Filled 2020-12-02: qty 1

## 2020-12-02 MED ORDER — LABETALOL HCL 5 MG/ML IV SOLN
5.0000 mg | Freq: Once | INTRAVENOUS | Status: DC
  Filled 2020-12-02: qty 4

## 2020-12-02 MED ORDER — SODIUM CHLORIDE 0.9 % IV BOLUS
1000.0000 mL | Freq: Once | INTRAVENOUS | Status: AC
  Administered 2020-12-02: 1000 mL via INTRAVENOUS

## 2020-12-02 NOTE — Telephone Encounter (Signed)
Patient called office - wanted to be seen today.  Has noted his BP increasing recently, this morning was 184 systolic.  He also notes dizziness, vertigo and overall feeling poorly.  Has been getting worse over past couple of weeks.    Advised patient to go to ED for evaluation.  Patient voiced understanding

## 2020-12-02 NOTE — ED Provider Notes (Signed)
Care assumed from Dr. Adela Lank - sent as transfer from [facility] for dizziness please see his full H&P.  In short,  Kevin [name] is a 75 y.o. male presents for ongoing dizziness since early in November.  Patient reports that it is constant but worsens acutely with position change.  He reports that he often loses his train of thought but per wife at bedside who assists with history he is not confused or altered.  Reports sometimes he feels lightheaded and generally weak but has had no near syncopal episodes.  He reports that other times he feels vertiginous like he is going to lose his balance.  He reports that position change always elicits the symptoms but it is worst in the morning when he gets up out of bed.  Patient reports that around the time his symptoms started he was started on amlodipine for hypertension.  Reports he has been taking this as directed until this morning.  He reports after this morning's episode was so severe he did not take the amlodipine but did take his other morning medications.  States that he has been checking his blood pressure over the last week or so and has noticed it increasing somewhat regularly.  This morning it was 180 systolic.  Patient also reports that about the time he was started on amlodipine he had a repeat cardiac echo which showed worsening aortic regurgitation.  Patient adamantly denies chest pain or shortness of breath.  Also denies cough, congestion, fever, decreased oral intake.  Denies dark or bloody stools.  Denies any other medication changes.  Patient is adamant that he has not had any near syncopal episodes including no vision changes with this, difficulty meeting upright or falls but when the acute symptoms have been he needs to stand still before he can move.  He states the acuity of this last 3 to 5 seconds but it never goes away.  Cardiologist: Nanetta Batty, MD   Physical Exam  BP (!) 180/60   Pulse (!) 56   Temp 97.9 F (36.6 C)  (Oral)   Resp 16   Ht 5\' 9"  (1.753 m)   Wt 90.7 kg   SpO2 99%   BMI 29.53 kg/m   Physical Exam Vitals and nursing note reviewed.  Constitutional:      General: He is not in acute distress.    Appearance: He is well-developed. He is not ill-appearing.  HENT:     Head: Normocephalic.  Eyes:     General: No scleral icterus.    Conjunctiva/sclera: Conjunctivae normal.  Cardiovascular:     Rate and Rhythm: Normal rate.     Heart sounds: Murmur heard.  Pulmonary:     Effort: Pulmonary effort is normal.  Abdominal:     General: There is no distension.  Musculoskeletal:        General: Normal range of motion.     Cervical back: Normal range of motion.  Skin:    General: Skin is warm and dry.  Neurological:     Mental Status: He is alert.  Psychiatric:        Mood and Affect: Mood normal.    ED Course/Procedures   Clinical Course as of 12/02/20 1651  Mon Dec 02, 2020  1614 BP(!): 181/63 Noted - pt without CP/SOB.  Has not has his mid-day hydralazine.  Will give here and he is to take his evening dose as scheduled.  [HM]    Clinical Course User Index [HM] Milah Recht, Dec 04, 2020,  PA-C    Procedures  MDM   Presents as transfer from [facility].  MRI here is negative.  No focal neurologic findings.  No evidence of stroke.  Concur with Dr. Adela Lank that symptoms may be from worsening aortic regurg.  Long conversation with patient and family about this.  He will need close and somewhat urgent follow-up with cardiology for further evaluation.  Discussed return precautions given including any episodes of syncope or near syncope.  Patient and family state understanding and are in agreement with the plan.   Dizziness        Irisa Grimsley, Boyd Kerbs 12/02/20 1658    Terald Sleeper, MD 12/02/20 [phone]

## 2020-12-02 NOTE — Discharge Instructions (Signed)
1. Medications: usual home medications 2. Treatment: rest, drink plenty of fluids,  3. Follow Up: Please followup with Dr. Allyson Sabal this week for further evaluation; Please return to the ER for syncope, near syncope, shortness of breath, chest pain or any other concerns.

## 2020-12-02 NOTE — ED Notes (Signed)
Pt request to kee[ IV in for Hospital to hospital for MRI. Dr. Adela Lank Made aware

## 2020-12-02 NOTE — ED Triage Notes (Signed)
Pt reports that since the beginning of november he has experienced dizziness, loss of balance, brain fog and confusion. Pt did state that his cardiac medication was changed on Nov 1.  Pt currently denis headache and denis vision problems, but states he is having still having brain fog.

## 2020-12-02 NOTE — ED Notes (Signed)
Pt unable to sign d/c because computer is not working.

## 2020-12-02 NOTE — ED Provider Notes (Signed)
[facility] EMERGENCY DEPT Provider Note   CSN: [id number] Arrival date & time: 12/02/20  0846     History Chief Complaint  Patient presents with   Dizziness    Kevin RODELO is a 75 y.o. male.  75 yo M with a chief complaint of dizziness.  This been going on for a couple weeks now.  He feels slightly confused at times and also feels like he is lightheaded.  Sometimes feels like he is going to lose his balance.  Has not noticed a lot of things that make it worse but does seem to be worse when he first stands up.  His symptoms started about the same time that he was started on amlodipine for persistent hypertension.  He had seen his cardiologist at that time and he had ordered a repeat ultrasound of his heart that showed worsening aortic regurgitation.  He denies any chest pain or trouble breathing denies cough congestion or fever denies any decreased oral intake.  He denies dark stool or blood in his stool.  He denies any other medication change but did tell me he was taking a lot of herbal supplements and after talking over with one of his friends who is a doctor he stopped taking about half of them.  The history is provided by the patient and the spouse.  Dizziness Associated symptoms: no chest pain, no diarrhea, no headaches, no palpitations, no shortness of breath and no vomiting   Illness Severity:  Moderate Onset quality:  Gradual Duration:  2 weeks Timing:  Intermittent Progression:  Worsening Chronicity:  New Associated symptoms: no abdominal pain, no chest pain, no congestion, no diarrhea, no fever, no headaches, no myalgias, no rash, no shortness of breath and no vomiting       Past Medical History:  Diagnosis Date   Arthritis    osteoarthritis-hips/ shoulders- no issues at present   Complication of anesthesia    longer to wake up   Foley catheter in place 05/30/2013   PT HAD SURG 05/30/13 - CYSTO AND CLOT EVACUATION BLADDER / PROSTATE - UNABLE TO VOID  AFTER DISCHARGE - CAME BACK TO ER SAME NIGHT AND HAD FOLEY INSERTED TO DRAINAGE BAG - URINE WAS BLOODY BUT PT REPORTS ON 06/07/13 THAT URINE IN BAG MOSTLY CLEAR NOW.   GERD (gastroesophageal reflux disease)    History of kidney stones    x1   Hypertension    Muscle tension dysphonia     Patient Active Problem List   Diagnosis Date Noted   Palpitations 04/27/2017   Moderate aortic insufficiency 04/27/2017   Blurred vision, bilateral 05/31/2014   Essential hypertension 01/25/2014   Benign prostatic hypertrophy 06/09/2013    Past Surgical History:  Procedure Laterality Date   BACK SURGERY     lumbar    CYSTOSCOPY W/ URETEROSCOPY W/ LITHOTRIPSY     CYSTOSCOPY/RETROGRADE/URETEROSCOPY/STONE EXTRACTION WITH BASKET N/A 05/30/2013   Procedure: CYSTOSCOPY , BLADDER BIOPSY AND CLOT EVACUATION , CAUTERIZATIO OF LATERAL RIGHT AND LEFT LOBE OF PROSTATE;  Surgeon: Kathi Ludwig, MD;  Location: [facility];  Service: Urology;  Laterality: N/A;   HERNIA REPAIR     inguinal   INSERTION OF MESH N/A 11/24/2017   Procedure: INSERTION OF MESH;  Surgeon: Manus Rudd, MD;  Location: [facility];  Service: General;  Laterality: N/A;   JOINT REPLACEMENT     '00-left/'08 RTHA   KNEE ARTHROSCOPY     left knee meniscus surgery   PROSTATE SURGERY Bilateral  7- 8 yrs ago   SHOULDER ARTHROSCOPY Right    SHOULDER ARTHROTOMY Left    TRANSURETHRAL RESECTION OF PROSTATE N/A 06/09/2013   Procedure: TRANSURETHRAL RESECTION OF THE PROSTATE (TURP) WITH GYRUS;  Surgeon: Ailene Rud, MD;  Location: [facility];  Service: Urology;  Laterality: N/A;   UMBILICAL HERNIA REPAIR N/A 11/24/2017   Procedure: UMBILICAL HERNIA REPAIR WITH MESH;  Surgeon: Donnie Mesa, MD;  Location: [facility];  Service: General;  Laterality: N/A;       Family History  Problem Relation Age of Onset   Lung cancer Mother    Heart attack Father     Social History   Tobacco Use   Smoking status: Never   Smokeless tobacco: Never  Vaping  Use   Vaping Use: Never used  Substance Use Topics   Alcohol use: Yes    Comment: social 1-2 drinks weekly   Drug use: No    Home Medications Prior to Admission medications   Medication Sig Start Date End Date Taking? Authorizing Provider  amLODipine (NORVASC) 2.5 MG tablet Take 1 tablet (2.5 mg total) by mouth daily. 11/19/20 02/17/21  Lorretta Harp, MD  amoxicillin (AMOXIL) 500 MG capsule Before dental procedures 09/06/19   [provider]  aspirin 81 MG EC tablet Take 1 tablet (81 mg total) by mouth daily. Swallow whole. Patient not taking: Reported on 11/19/2020 04/30/14   Garvin Fila, MD  budesonide (PULMICORT) 180 MCG/ACT inhaler Inhale 1 puff into the lungs as needed.     [provider]  chlorthalidone (HYGROTON) 25 MG tablet Take 1 tablet (25 mg total) by mouth daily. 01/17/20   Lorretta Harp, MD  hydrALAZINE (APRESOLINE) 100 MG tablet Take 1 tablet (100 mg total) by mouth 3 (three) times daily. 01/04/20   Almyra Deforest, PA  Ibuprofen-Acetaminophen (ADVIL DUAL ACTION PO) Take 2 capsules by mouth.    [provider]  latanoprost (XALATAN) 0.005 % ophthalmic solution Place 1 drop into both eyes at bedtime.    [provider]  Methylsulfonylmethane 1000 MG CAPS Take 2,000 mg by mouth daily.    [provider]  Misc Natural Products (TART CHERRY ADVANCED PO) Take 2 capsules by mouth daily.    [provider]  montelukast (SINGULAIR) 10 MG tablet Take 10 mg by mouth daily.  11/03/16   [provider]  SYSTANE ULTRA 0.4-0.3 % SOLN Apply 1 drop to eye 3 (three) times daily. 05/31/19   [provider]  traMADol (ULTRAM) 50 MG tablet Take 50 mg by mouth every 6 (six) hours as needed. 06/26/19   [provider]  TURMERIC PO Take 2 capsules by mouth daily.    [provider]  White Petrolatum-Mineral Oil (SYSTANE NIGHTTIME) OINT SMARTSIG:1 Inch(es) In Eye(s) Every Night 05/31/19   [provider]     Allergies    Patient has no known allergies.  Review of Systems   Review of Systems  Constitutional:  Negative for chills and fever.  HENT:  Negative for congestion and facial swelling.   Eyes:  Negative for discharge and visual disturbance.  Respiratory:  Negative for shortness of breath.   Cardiovascular:  Negative for chest pain and palpitations.  Gastrointestinal:  Negative for abdominal pain, diarrhea and vomiting.  Musculoskeletal:  Negative for arthralgias and myalgias.  Skin:  Negative for color change and rash.  Neurological:  Positive for dizziness. Negative for tremors, syncope and headaches.  Psychiatric/Behavioral:  Negative for confusion and dysphoric mood.  Physical Exam Updated Vital Signs BP (!) 163/62   Pulse 60   Temp 98.3 F (36.8 C) (Oral)   Resp 20   Ht 5\' 9"  (1.753 m)   Wt 90.7 kg   SpO2 96%   BMI 29.53 kg/m   Physical Exam Vitals and nursing note reviewed.  Constitutional:      Appearance: He is well-developed.  HENT:     Head: Normocephalic and atraumatic.  Eyes:     Pupils: Pupils are equal, round, and reactive to light.  Neck:     Vascular: No JVD.  Cardiovascular:     Rate and Rhythm: Normal rate and regular rhythm.     Heart sounds: No murmur heard.   No friction rub. No gallop.  Pulmonary:     Effort: No respiratory distress.     Breath sounds: No wheezing.  Abdominal:     General: There is no distension.     Tenderness: There is no abdominal tenderness. There is no guarding or rebound.  Musculoskeletal:        General: Normal range of motion.     Cervical back: Normal range of motion and neck supple.  Skin:    Coloration: Skin is not pale.     Findings: No rash.  Neurological:     Mental Status: He is alert and oriented to person, place, and time.     Cranial Nerves: Cranial nerves 2-12 are intact.     Sensory: Sensation is intact.     Motor: Motor function is intact.     Coordination: Coordination is intact.      Gait: Gait is intact.     Comments: The patient is able to rapidly get to a seated position.  Is able to ambulate without issue.  Benign neurologic exam.  Psychiatric:        Behavior: Behavior normal.    ED Results / Procedures / Treatments   Labs (all labs ordered are listed, but only abnormal results are displayed) Labs Reviewed  CBC - Abnormal; Notable for the following components:      Result Value   Platelets 134 (*)    All other components within normal limits  COMPREHENSIVE METABOLIC PANEL - Abnormal; Notable for the following components:   Potassium 3.4 (*)    Glucose, Bld 118 (*)    All other components within normal limits  RAPID URINE DRUG SCREEN, HOSP PERFORMED - Abnormal; Notable for the following components:   Tetrahydrocannabinol POSITIVE (*)    All other components within normal limits  URINALYSIS, ROUTINE W REFLEX MICROSCOPIC - Abnormal; Notable for the following components:   Protein, ur TRACE (*)    All other components within normal limits  RESP PANEL BY RT-PCR (FLU A&B, COVID) ARPGX2  PROTIME-INR  APTT  DIFFERENTIAL  ETHANOL    EKG EKG Interpretation  Date/Time:  Monday December 02 2020 09:01:46 EST Ventricular Rate:  74 PR Interval:  223 QRS Duration: 127 QT Interval:  428 QTC Calculation: 475 R Axis:   -59 Text Interpretation: Sinus rhythm Prolonged PR interval LVH with IVCD, LAD and secondary repol abnrm Baseline wander in lead(s) V2 No significant change since last tracing Confirmed by Deno Etienne [phone]) on 12/02/2020 10:32:37 AM  Radiology CT HEAD WO CONTRAST  Result Date: 12/02/2020 CLINICAL DATA:  Focal neurological deficit.  Dizziness. EXAM: CT HEAD WITHOUT CONTRAST TECHNIQUE: Contiguous axial images were obtained from the base of the skull through the vertex without intravenous contrast. COMPARISON:  None. FINDINGS: Brain: No  evidence of acute infarction, hemorrhage, extra-axial collection, ventriculomegaly, or mass effect. Generalized  cerebral atrophy. Periventricular white matter low attenuation likely secondary to microangiopathy. Vascular: Cerebrovascular atherosclerotic calcifications are noted. Skull: Negative for fracture or focal lesion. Sinuses/Orbits: Visualized portions of the orbits are unremarkable. Visualized portions of the paranasal sinuses are unremarkable. Visualized portions of the mastoid air cells are unremarkable. Other: None. IMPRESSION: 1. No acute intracranial pathology. 2. Chronic microvascular disease and cerebral atrophy. Electronically Signed   By: Kathreen Devoid M.D.   On: 12/02/2020 09:46    Procedures Procedures   Medications Ordered in ED Medications  labetalol (NORMODYNE) injection 5 mg (5 mg Intravenous Not Given 12/02/20 1050)  meclizine (ANTIVERT) tablet 25 mg (25 mg Oral Given 12/02/20 1009)  sodium chloride 0.9 % bolus 1,000 mL (1,000 mLs Intravenous New Bag/Given 12/02/20 1008)    ED Course  I have reviewed the triage vital signs and the nursing notes.  Pertinent labs & imaging results that were available during my care of the patient were reviewed by me and considered in my medical decision making (see chart for details).    MDM Rules/Calculators/A&P                           75 yo M with a chief complaints of dizziness.  This is been going on for about 2 weeks now.  At the onset he had seen his cardiologist who started him on a new medication.  Could be the cause of his symptoms.  He also had a repeat echo that showed that his aortic regurgitation had gone from moderate to moderate/severe.  Will obtain a laboratory evaluation bolus of IV fluids meclizine.  I did discuss the case with Dr. Margaretann Loveless, cardiology.  She felt with the blood pressure over [id number] systolic that it could be causing him symptoms with his aortic insufficiency.  She based on my story and exam felt that close follow-up with his cardiologist could be reasonable.  She would reach out to Dr. Alvester Chou to try and have his  office contact him this afternoon.  She recommended giving him a dose of labetalol to try and reduce that.  The patient had progressive improvement with his blood pressure without intervention here.  We will hold off on labetalol at the moment.  Though the timeline is more consistent with this medication change the patient with unexplained dizziness I discussed risk and benefits of MRI with him.  He currently would like to have the MRI obtained.  I discussed with Dr. Francia Greaves who accepts the patient in ED to ED transfer.  The patients results and plan were reviewed and discussed.   Any x-rays performed were independently reviewed by myself.   Differential diagnosis were considered with the presenting HPI.  Medications  labetalol (NORMODYNE) injection 5 mg (5 mg Intravenous Not Given 12/02/20 1050)  meclizine (ANTIVERT) tablet 25 mg (25 mg Oral Given 12/02/20 1009)  sodium chloride 0.9 % bolus 1,000 mL (1,000 mLs Intravenous New Bag/Given 12/02/20 1008)    Vitals:   12/02/20 0902 12/02/20 0945 12/02/20 1000 12/02/20 1009  BP: (!) 202/77 (!) 181/66 (!) 177/63 (!) 163/62  Pulse: 75 62 64 60  Resp: 19 14 15 20   Temp: 98.3 F (36.8 C)     TempSrc: Oral     SpO2: 97% 95% 97% 96%  Weight:      Height:        Final diagnoses:  Dizziness  Admission/ observation were discussed with the admitting physician, patient and/or family and they are comfortable with the plan.    Final Clinical Impression(s) / ED Diagnoses Final diagnoses:  Dizziness    Rx / DC Orders ED Discharge Orders     None        Deno Etienne, DO 12/02/20 1055

## 2020-12-02 NOTE — ED Notes (Signed)
Attempted to call report to Charge RN for Pt coming for MRI. No Answer

## 2020-12-03 ENCOUNTER — Telehealth: Payer: Self-pay | Admitting: Pharmacist Clinician (PhC)/ Clinical Pharmacy Specialist

## 2020-12-03 ENCOUNTER — Telehealth: Payer: Self-pay

## 2020-12-03 MED ORDER — OLMESARTAN MEDOXOMIL 20 MG PO TABS: 20.0000 mg | ORAL_TABLET | Freq: Every day | ORAL | 6 refills | Status: DC

## 2020-12-03 NOTE — Telephone Encounter (Signed)
Erroneous encounter

## 2020-12-03 NOTE — Telephone Encounter (Signed)
Patient called this AM, spent much of day in ED yesterday with dizziness, hypertension.  Was advised by ED to see cardiology ASAP.   Patient had been seen by Dr. Allyson Sabal earlier this month and was not in need of f/u.     Wondering if symptoms are related to starting amlodipine - started 2.5 mg daily when saw Dr. Allyson Sabal.  Will have him d/c now and start olmesartan 20 mg daily.  He is scheduled for follow up in [facility] on Nov 29.

## 2020-12-06 ENCOUNTER — Telehealth: Payer: Self-pay

## 2020-12-06 NOTE — Telephone Encounter (Signed)
Patient would like to speak to someone regarding BP and upcoming PharmD appointment.  Please call to advise.  Thank you

## 2020-12-10 NOTE — Telephone Encounter (Signed)
Spoke with patient 11/21.  He was describing symptoms of dizziness/vertigo that were not cardiology related.  Noted that he felt fine several days after stopping amlodipine, but then felt similar symptoms over the weekend.  Advised that this is not from BP medications, and he should check in with PCP or ENT about these symptoms.  Patient voiced understanding.

## 2020-12-17 ENCOUNTER — Ambulatory Visit (INDEPENDENT_AMBULATORY_CARE_PROVIDER_SITE_OTHER): Payer: Medicare Other | Admitting: Pharmacist Clinician (PhC)/ Clinical Pharmacy Specialist

## 2020-12-17 ENCOUNTER — Other Ambulatory Visit: Payer: Self-pay

## 2020-12-17 DIAGNOSIS — I10 Essential (primary) hypertension: Secondary | ICD-10-CM

## 2020-12-17 DIAGNOSIS — M1990 Unspecified osteoarthritis, unspecified site: Secondary | ICD-10-CM

## 2020-12-17 DIAGNOSIS — I2584 Coronary atherosclerosis due to calcified coronary lesion: Secondary | ICD-10-CM

## 2020-12-17 DIAGNOSIS — I2510 Atherosclerotic heart disease of native coronary artery without angina pectoris: Secondary | ICD-10-CM

## 2020-12-17 NOTE — Patient Instructions (Signed)
Return for a a follow up appointment January 3 at 10 am  Check your blood pressure at home daily (if able) and keep record of the readings.  Take your BP meds as follows:  Today - Decrease hydralazine to twice daily  December 2 - decrease hydralazine to once daily  December 5 - stop hydralazine  December 12 - stop olmesartan  December 19 - stop chlorthalidone  If at any time you get readings 2 days in a row > 170 systolic, please call me and we will have to add something back in.    Consider tart cherry juice 2 oz twice daily to help with arthritis pain; cut meloxicam to every other day for at least 1-2 weeks after you start this.  Beet juice 4 oz twice daily to see if any benefit in BP lowering.     Bring all of your meds, your BP cuff and your record of home blood pressures to your next appointment.  Exercise as you're able, try to walk approximately 30 minutes per day.  Keep salt intake to a minimum, especially watch canned and prepared boxed foods.  Eat more fresh fruits and vegetables and fewer canned items.  Avoid eating in fast food restaurants.    HOW TO TAKE YOUR BLOOD PRESSURE: Rest 5 minutes before taking your blood pressure.  Don't smoke or drink caffeinated beverages for at least 30 minutes before. Take your blood pressure before (not after) you eat. Sit comfortably with your back supported and both feet on the floor (don't cross your legs). Elevate your arm to heart level on a table or a desk. Use the proper sized cuff. It should fit smoothly and snugly around your bare upper arm. There should be enough room to slip a fingertip under the cuff. The bottom edge of the cuff should be 1 inch above the crease of the elbow. Ideally, take 3 measurements at one sitting and record the average.

## 2020-12-17 NOTE — Progress Notes (Signed)
12/18/2020 Kevin [name] 1945-12-04 [id number]   HPI:  Kevin [name] is a 75 y.o. male patient of Dr Allyson Sabal, with a PMH below who presents today for hypertension clinic evaluation.  We have followed with Kevin [name] off and on over the past several years.  Today he notes that over the past 18 months his  pressure has slowly crept upwards, peaking about 160 systolic.  He had a telehealth visit with Azalee Course last December, at which time his hydralazine was increased to 100 mg three times daily.  Patient reports that this did little to improve blood pressure, and after a few months, the readings started to creep up further.  He saw Dr. Allyson Sabal on Nov 1.  At that time his BP was 150/62 and amlodipine 2.5 mg daily was added.  He called in 2 weeks later, feeling poorly overall, with vertigo (even when seated) and dizziness.  His BP was up to 187/84.  He was advised to go to ED, as he noted the the symptoms had been worsening over the past week or two.  At that time the amlodipine was stopped and we started him on olmesartan 20 mg.  He then called a few days later stating that his symptoms were still present.  The information was reviewed with Dr. Allyson Sabal and it was agreed that he needed to follow up with his PCP or ENT, as the symptoms did not seem to be cardiology related.    Today he returns for a follow up on blood pressure issues.  He is still having issues with vertigo and dizziness.  Has appointment set with his ENT for next week.  He also complains of a "brain fog", which started 2-3 months ago.  States he finds himself forgetting names, having concentration problems and finding it harder to read and follow music.          Blood Pressure Goal:  130/80  Current Medications:  olmesartan 20 mg qd, chlorthalidone 25 mg qd, hydralazine 100 mg tid  Family Hx:  father had hypertension for many years, died after MI at age 61.  No cardiac history in mother.  One sister with hypertension.  Social  Hx:  No tobacco, drinks 2-3 cups of coffee/day, [employer] home brew.  Drinks 2-3 glasses of wine most evenings  Diet: mostly home cooked from scratch, no added salt  Exercise: limited due to vertigo   Home BP readings: average 146/69  (14 readings)   Intolerances: nkda  Labs: 12/02/20:  Na 140, K 3.4, Glu 118, BUN 20, SCr 0.94 GFR >60   Wt Readings from Last 3 Encounters:  12/02/20 200 lb (90.7 kg)  11/19/20 205 lb 6.4 oz (93.2 kg)  06/19/20 200 lb (90.7 kg)   BP Readings from Last 3 Encounters:  12/17/20 (!) 128/52  12/02/20 (!) 172/63  11/19/20 (!) 150/62   Pulse Readings from Last 3 Encounters:  12/17/20 72  12/02/20 64  11/19/20 76    Current Outpatient Medications  Medication Sig Dispense Refill   amoxicillin (AMOXIL) 500 MG capsule Before dental procedures     budesonide (PULMICORT) 180 MCG/ACT inhaler Inhale 1 puff into the lungs as needed.      chlorthalidone (HYGROTON) 25 MG tablet Take 1 tablet (25 mg total) by mouth daily. 90 tablet 3   hydrALAZINE (APRESOLINE) 100 MG tablet Take 1 tablet (100 mg total) by mouth 3 (three) times daily. 270 tablet 3   Ibuprofen-Acetaminophen (ADVIL DUAL ACTION PO) Take  2 capsules by mouth.     latanoprost (XALATAN) 0.005 % ophthalmic solution Place 1 drop into both eyes at bedtime.     Methylsulfonylmethane 1000 MG CAPS Take 2,000 mg by mouth daily.     Misc Natural Products (TART CHERRY ADVANCED PO) Take 2 capsules by mouth daily.     montelukast (SINGULAIR) 10 MG tablet Take 10 mg by mouth daily.   5   olmesartan (BENICAR) 20 MG tablet Take 1 tablet (20 mg total) by mouth daily. 30 tablet 6   SYSTANE ULTRA 0.4-0.3 % SOLN Apply 1 drop to eye 3 (three) times daily.     traMADol (ULTRAM) 50 MG tablet Take 50 mg by mouth every 6 (six) hours as needed.     TURMERIC PO Take 2 capsules by mouth daily.     White Petrolatum-Mineral Oil (SYSTANE NIGHTTIME) OINT SMARTSIG:1 Inch(es) In Eye(s) Every Night     No current  facility-administered medications for this visit.    No Known Allergies  Past Medical History:  Diagnosis Date   Arthritis    osteoarthritis-hips/ shoulders- no issues at present   Complication of anesthesia    longer to wake up   Foley catheter in place 05/30/2013   PT HAD SURG 05/30/13 - CYSTO AND CLOT EVACUATION BLADDER / PROSTATE - UNABLE TO VOID AFTER DISCHARGE - CAME BACK TO ER SAME NIGHT AND HAD FOLEY INSERTED TO DRAINAGE BAG - URINE WAS BLOODY BUT PT REPORTS ON 06/07/13 THAT URINE IN BAG MOSTLY CLEAR NOW.   GERD (gastroesophageal reflux disease)    History of kidney stones    x1   Hypertension    Muscle tension dysphonia     Blood pressure (!) 128/52, pulse 72.  Standing 130/54 (at standing), after 2 minutes 132/52  Essential hypertension Patient with essential hypertension, still not at BP goal.  He wants to stop all medications for at least a few weeks to determine if his complaints are medication related.   Explained that he cannot stop all 3 suddenly, due to concerns for rebound hypertension.  Will have him decrease hydralazine to bid x 3 days, then qd x 3 days then dc.  After 1 week he can stop the olmesartan, then after another week stop the chlorthalidone.  He was told that if his BP was > 170 systolic on more than one occasion, he would need to contact the office.  He is going to add in beet juice (4 oz bid) to help with BP lowering.  Will see him back in a month for follow up.   Arthritis Patient currently taking meloxicam 7. 5 mg daily, with dose recently decreased from 15 mg daily.  Advised that NSAID's can also contribute to hypertension, and since he is looking at more natural treatments, suggested he try 2 oz tart cherry juice twice daily and cut the meloxicam to every other day.  After 2-3 weeks, if he feels the juice is beneficial, he can d/c the meloxicam.     [name] PharmD CPP [facility] [address] [place], [place] [address] [phone]

## 2020-12-18 ENCOUNTER — Encounter: Payer: Self-pay | Admitting: Pharmacist Clinician (PhC)/ Clinical Pharmacy Specialist

## 2020-12-18 DIAGNOSIS — M1990 Unspecified osteoarthritis, unspecified site: Secondary | ICD-10-CM | POA: Insufficient documentation

## 2020-12-18 NOTE — Assessment & Plan Note (Signed)
Patient currently taking meloxicam 7. 5 mg daily, with dose recently decreased from 15 mg daily.  Advised that NSAID's can also contribute to hypertension, and since he is looking at more natural treatments, suggested he try 2 oz tart cherry juice twice daily and cut the meloxicam to every other day.  After 2-3 weeks, if he feels the juice is beneficial, he can d/c the meloxicam.

## 2020-12-18 NOTE — Assessment & Plan Note (Signed)
Patient with essential hypertension, still not at BP goal.  He wants to stop all medications for at least a few weeks to determine if his complaints are medication related.   Explained that he cannot stop all 3 suddenly, due to concerns for rebound hypertension.  Will have him decrease hydralazine to bid x 3 days, then qd x 3 days then dc.  After 1 week he can stop the olmesartan, then after another week stop the chlorthalidone.  He was told that if his BP was > 170 systolic on more than one occasion, he would need to contact the office.  He is going to add in beet juice (4 oz bid) to help with BP lowering.  Will see him back in a month for follow up.

## 2020-12-29 ENCOUNTER — Other Ambulatory Visit: Payer: Self-pay | Admitting: Physician Assistant

## 2021-01-07 ENCOUNTER — Other Ambulatory Visit: Payer: Self-pay | Admitting: Cardiovascular Disease

## 2021-01-10 ENCOUNTER — Telehealth: Payer: Self-pay | Admitting: Pharmacist Clinician (PhC)/ Clinical Pharmacy Specialist

## 2021-01-10 NOTE — Telephone Encounter (Signed)
Patient called to report BP readings increased this week after stopping olmesartan.  Had been on taper since early Dec to wean off BP meds, as he suspected they were to blame for his feeling "foggy headed".  He was given schedule to slowly taper hydralazine, olmesartan and chlorthalidone.  Took last dose of olmesartan on Sunday night.  BP Monday was 155/70.  States before that he was mostly 130-150 systolic.   Advised that he re-start the olmesartan and continue to monitor.  If still > 150 systolic after a week he will need to add chlorthalidone back.    Patient voiced understanding.

## 2021-01-13 ENCOUNTER — Emergency Department (HOSPITAL_BASED_OUTPATIENT_CLINIC_OR_DEPARTMENT_OTHER)
Admission: EM | Admit: 2021-01-13 | Discharge: 2021-01-13 | Disposition: A | Discharge status: Home / Self Care | Payer: Medicare Other | Attending: Emergency Medicine | Admitting: Emergency Medicine

## 2021-01-13 ENCOUNTER — Encounter: Payer: Self-pay | Admitting: Pharmacist Clinician (PhC)/ Clinical Pharmacy Specialist

## 2021-01-13 ENCOUNTER — Emergency Department (HOSPITAL_BASED_OUTPATIENT_CLINIC_OR_DEPARTMENT_OTHER): Payer: Medicare Other | Admitting: Radiology

## 2021-01-13 ENCOUNTER — Other Ambulatory Visit: Payer: Self-pay

## 2021-01-13 ENCOUNTER — Encounter (HOSPITAL_BASED_OUTPATIENT_CLINIC_OR_DEPARTMENT_OTHER): Payer: Self-pay | Admitting: *Deleted

## 2021-01-13 DIAGNOSIS — Z96652 Presence of left artificial knee joint: Secondary | ICD-10-CM | POA: Insufficient documentation

## 2021-01-13 DIAGNOSIS — Z79899 Other long term (current) drug therapy: Secondary | ICD-10-CM | POA: Diagnosis not present

## 2021-01-13 DIAGNOSIS — R030 Elevated blood-pressure reading, without diagnosis of hypertension: Secondary | ICD-10-CM

## 2021-01-13 DIAGNOSIS — I10 Essential (primary) hypertension: Secondary | ICD-10-CM | POA: Diagnosis present

## 2021-01-13 DIAGNOSIS — R42 Dizziness and giddiness: Secondary | ICD-10-CM | POA: Insufficient documentation

## 2021-01-13 LAB — CBC
HCT: 43.3 % (ref 39.0–52.0)
Hemoglobin: 14.9 g/dL (ref 13.0–17.0)
MCH: 29.9 pg (ref 26.0–34.0)
MCHC: 34.4 g/dL (ref 30.0–36.0)
MCV: 86.9 fL (ref 80.0–100.0)
Platelets: 142 10*3/uL — ABNORMAL LOW (ref 150–400)
RBC: 4.98 MIL/uL (ref 4.22–5.81)
RDW: 13.5 % (ref 11.5–15.5)
WBC: 7.3 10*3/uL (ref 4.0–10.5)
nRBC: 0 % (ref 0.0–0.2)

## 2021-01-13 LAB — BASIC METABOLIC PANEL
Anion gap: 8 (ref 5–15)
BUN: 16 mg/dL (ref 8–23)
CO2: 29 mmol/L (ref 22–32)
Calcium: 9.1 mg/dL (ref 8.9–10.3)
Chloride: 105 mmol/L (ref 98–111)
Creatinine, Ser: 0.89 mg/dL (ref 0.61–1.24)
GFR, Estimated: >60 mL/min (ref >60)
Glucose, Bld: 95 mg/dL (ref 70–99)
Potassium: 3.7 mmol/L (ref 3.5–5.1)
Sodium: 142 mmol/L (ref 135–145)

## 2021-01-13 LAB — TROPONIN I (HIGH SENSITIVITY): Troponin I (High Sensitivity): 10 ng/L (ref <18)

## 2021-01-13 NOTE — ED Triage Notes (Signed)
Pt states he has had an increase in his bp for the last 3 days, he is followed by Cardiology who has not been able to get his BP within a normal range, dizziness on and off.

## 2021-01-13 NOTE — ED Provider Notes (Signed)
[place] EMERGENCY DEPT Provider Note   CSN: [id number] Arrival date & time: 01/13/21  1717     History Chief Complaint  Patient presents with   Hypertension    DELRON TAIWO is a 75 y.o. male.  Patient with hx htn, presents w concern as bp has been high. Symptoms gradual onset, waxing and waning, persistent. States his pcp and has been weaning down some of his bp meds recently, and since then bp has been up. Pt was then instructed to resume dose of a couple of his bp meds, which he is doing. Otherwise, does not feel acutely sick or ill. No severe headaches. No chest pain or sob, or unusual doe. No swelling.   The history is provided by the patient, a significant other and medical records.  Dizziness Associated symptoms: no chest pain, no headaches, no shortness of breath and no weakness   Hypertension Pertinent negatives include no chest pain, no abdominal pain, no headaches and no shortness of breath.      Past Medical History:  Diagnosis Date   Arthritis    osteoarthritis-hips/ shoulders- no issues at present   Complication of anesthesia    longer to wake up   Foley catheter in place 05/30/2013   PT HAD SURG 05/30/13 - CYSTO AND CLOT EVACUATION BLADDER / PROSTATE - UNABLE TO VOID AFTER DISCHARGE - CAME BACK TO ER SAME NIGHT AND HAD FOLEY INSERTED TO DRAINAGE BAG - URINE WAS BLOODY BUT PT REPORTS ON 06/07/13 THAT URINE IN BAG MOSTLY CLEAR NOW.   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   Blurred vision, bilateral 05/31/2014   Essential hypertension 01/25/2014   Benign prostatic hypertrophy 06/09/2013    Past Surgical History:  Procedure Laterality Date   BACK SURGERY     lumbar    CYSTOSCOPY W/ URETEROSCOPY W/ LITHOTRIPSY      CYSTOSCOPY/RETROGRADE/URETEROSCOPY/STONE EXTRACTION WITH BASKET N/A 05/30/2013   Procedure: CYSTOSCOPY , BLADDER BIOPSY AND CLOT EVACUATION , CAUTERIZATIO OF LATERAL RIGHT AND LEFT LOBE OF PROSTATE;  Surgeon: Ailene Rud, MD;  Location: [facility];  Service: Urology;  Laterality: N/A;   HERNIA REPAIR     inguinal   INSERTION OF MESH N/A 11/24/2017   Procedure: INSERTION OF MESH;  Surgeon: Donnie Mesa, MD;  Location: [facility];  Service: General;  Laterality: N/A;   JOINT REPLACEMENT     '00-left/'08 RTHA   KNEE ARTHROSCOPY     left knee meniscus surgery   PROSTATE SURGERY Bilateral    7- 8 yrs ago   SHOULDER ARTHROSCOPY Right    SHOULDER ARTHROTOMY Left    TRANSURETHRAL RESECTION OF PROSTATE N/A 06/09/2013   Procedure: TRANSURETHRAL RESECTION OF THE PROSTATE (TURP) WITH GYRUS;  Surgeon: Ailene Rud, MD;  Location: [facility];  Service: Urology;  Laterality: N/A;   UMBILICAL HERNIA REPAIR N/A 11/24/2017   Procedure: UMBILICAL HERNIA REPAIR WITH MESH;  Surgeon: Donnie Mesa, MD;  Location: [place] [facility];  Service: General;  Laterality: N/A;       Family History  Problem Relation Age of Onset   Lung cancer Mother    Heart attack Father     Social History   Tobacco Use   Smoking status: Never   Smokeless tobacco:  Never  Vaping Use   Vaping Use: Never used  Substance Use Topics   Alcohol use: Yes    Comment: social 1-2 drinks weekly   Drug use: No    Home Medications Prior to Admission medications   Medication Sig Start Date End Date Taking? Authorizing Provider  amoxicillin (AMOXIL) 500 MG capsule Before dental procedures 09/06/19   [provider]  budesonide (PULMICORT) 180 MCG/ACT inhaler Inhale 1 puff into the lungs as needed.     [provider]  chlorthalidone (HYGROTON) 25 MG tablet Take 1 tablet (25 mg total) by mouth daily. **Keep upcoming appointment for future refills** 01/07/21   Runell Gess, MD  hydrALAZINE (APRESOLINE) 100 MG tablet  TAKE 1 TABLET(100 MG) BY MOUTH THREE TIMES DAILY 12/30/20   Runell Gess, MD  Ibuprofen-Acetaminophen (ADVIL DUAL ACTION PO) Take 2 capsules by mouth.    [provider]  latanoprost (XALATAN) 0.005 % ophthalmic solution Place 1 drop into both eyes at bedtime.    [provider]  Methylsulfonylmethane 1000 MG CAPS Take 2,000 mg by mouth daily.    [provider]  Misc Natural Products (TART CHERRY ADVANCED PO) Take 2 capsules by mouth daily.    [provider]  montelukast (SINGULAIR) 10 MG tablet Take 10 mg by mouth daily.  11/03/16   [provider]  olmesartan (BENICAR) 20 MG tablet Take 1 tablet (20 mg total) by mouth daily. 12/03/20   Runell Gess, MD  SYSTANE ULTRA 0.4-0.3 % SOLN Apply 1 drop to eye 3 (three) times daily. 05/31/19   [provider]  traMADol (ULTRAM) 50 MG tablet Take 50 mg by mouth every 6 (six) hours as needed. 06/26/19   [provider]  TURMERIC PO Take 2 capsules by mouth daily.    [provider]  White Petrolatum-Mineral Oil (SYSTANE NIGHTTIME) OINT SMARTSIG:1 Inch(es) In Eye(s) Every Night 05/31/19   [provider]    Allergies    Patient has no known allergies.  Review of Systems   Review of Systems  Constitutional:  Negative for fever.  HENT:  Negative for sore throat.   Eyes:  Negative for visual disturbance.  Respiratory:  Negative for shortness of breath.   Cardiovascular:  Negative for chest pain and leg swelling.  Gastrointestinal:  Negative for abdominal pain.  Genitourinary:  Negative for flank pain.  Musculoskeletal:  Negative for neck pain.  Skin:  Negative for rash.  Neurological:  Negative for weakness, light-headedness, numbness and headaches.  Hematological:  Does not bruise/bleed easily.  Psychiatric/Behavioral:  Negative for confusion.    Physical Exam Updated Vital Signs BP (!) 143/94 (BP Location: Right Arm)    Pulse (!) 58    Temp 98.5 F (36.9  C) (Oral)    Resp 16    Ht 1.753 m (5\' 9" )    Wt 90.7 kg    SpO2 97%    BMI 29.53 kg/m   Physical Exam Vitals and nursing note reviewed.  Constitutional:      Appearance: Normal appearance. He is well-developed.  HENT:     Head: Atraumatic.     Right Ear: Tympanic membrane normal.     Left Ear: Tympanic membrane normal.     Nose: Nose normal.     Mouth/Throat:     Mouth: Mucous membranes are moist.     Pharynx: Oropharynx is clear.  Eyes:     General: No scleral icterus.    Conjunctiva/sclera: Conjunctivae normal.  Pupils: Pupils are equal, round, and reactive to light.  Neck:     Vascular: No carotid bruit.     Trachea: No tracheal deviation.  Cardiovascular:     Rate and Rhythm: Normal rate and regular rhythm.     Pulses: Normal pulses.     Heart sounds: Normal heart sounds. No murmur heard.   No friction rub. No gallop.  Pulmonary:     Effort: Pulmonary effort is normal. No accessory muscle usage or respiratory distress.     Breath sounds: Normal breath sounds.  Abdominal:     General: Bowel sounds are normal. There is no distension.     Palpations: Abdomen is soft.     Tenderness: There is no abdominal tenderness.  Genitourinary:    Comments: No cva tenderness. Musculoskeletal:        General: No swelling.     Cervical back: Normal range of motion and neck supple. No rigidity.     Right lower leg: No edema.     Left lower leg: No edema.  Skin:    General: Skin is warm and dry.     Findings: No rash.  Neurological:     Mental Status: He is alert.     Comments: Alert, speech clear. Motor/sens grossly intact. Steady gait.   Psychiatric:        Mood and Affect: Mood normal.    ED Results / Procedures / Treatments   Labs (all labs ordered are listed, but only abnormal results are displayed) Results for orders placed or performed during the hospital encounter of [id number]  Basic metabolic panel  Result Value Ref Range   Sodium 142 135 - 145 mmol/L    Potassium 3.7 3.5 - 5.1 mmol/L   Chloride 105 98 - 111 mmol/L   CO2 29 22 - 32 mmol/L   Glucose, Bld 95 70 - 99 mg/dL   BUN 16 8 - 23 mg/dL   Creatinine, Ser 0.89 0.61 - 1.24 mg/dL   Calcium 9.1 8.9 - 10.3 mg/dL   GFR, Estimated >60 >60 mL/min   Anion gap 8 5 - 15  CBC  Result Value Ref Range   WBC 7.3 4.0 - 10.5 K/uL   RBC 4.98 4.22 - 5.81 MIL/uL   Hemoglobin 14.9 13.0 - 17.0 g/dL   HCT 43.3 39.0 - 52.0 %   MCV 86.9 80.0 - 100.0 fL   MCH 29.9 26.0 - 34.0 pg   MCHC 34.4 30.0 - 36.0 g/dL   RDW 13.5 11.5 - 15.5 %   Platelets 142 (L) 150 - 400 K/uL   nRBC 0.0 0.0 - 0.2 %  Troponin I (High Sensitivity)  Result Value Ref Range   Troponin I (High Sensitivity) 10 <18 ng/L   DG Chest 2 View  Result Date: 01/13/2021 CLINICAL DATA:  Hypertension dizzy EXAM: CHEST - 2 VIEW COMPARISON:  05/29/2013 FINDINGS: Cardiac size upper normal. No focal opacity, pleural effusion, or pneumothorax. IMPRESSION: No active disease Electronically Signed   By: Donavan Foil M.D.   On: 01/13/2021 18:14     EKG EKG Interpretation  Date/Time:  Monday January 13 2021 17:29:03 EST Ventricular Rate:  64 PR Interval:  208 QRS Duration: 116 QT Interval:  384 QTC Calculation: 396 R Axis:   -35 Text Interpretation: Normal sinus rhythm Left axis deviation Nonspecific ST and T wave abnormality Confirmed by Lajean Saver [id number]) on 01/13/2021 6:42:15 PM  Radiology DG Chest 2 View  Result Date: 01/13/2021 CLINICAL DATA:  Hypertension dizzy  EXAM: CHEST - 2 VIEW COMPARISON:  05/29/2013 FINDINGS: Cardiac size upper normal. No focal opacity, pleural effusion, or pneumothorax. IMPRESSION: No active disease Electronically Signed   By: Jasmine Pang M.D.   On: 01/13/2021 18:14    Procedures Procedures   Medications Ordered in ED Medications - No data to display  ED Course  I have reviewed the triage vital signs and the nursing notes.  Pertinent labs & imaging results that were available during my care of the  patient were reviewed by me and considered in my medical decision making (see chart for details).    MDM Rules/Calculators/A&P                         Labs and imaging ordered.   Reviewed nursing notes and prior charts for additional history. Reviewed pts avs outlining his docs changes in BP meds.   Labs reviewed/interpreted by me - chem normal. Hgb normal. Trop normal.  CXR reviewed/interpreted by me - no pna.   Recheck bp, 143/94.  Recommend/Discussed pcp f/u.     Final Clinical Impression(s) / ED Diagnoses Final diagnoses:  None    Rx / DC Orders ED Discharge Orders     None        Cathren Laine, MD 01/13/21 2024

## 2021-01-13 NOTE — Discharge Instructions (Signed)
It was our pleasure to provide your ER care today - we hope that you feel better.  Overall, your lab work looks good. Your blood pressure is mildly high, but does not require emergent lowering.   Continue your blood pressure meds as directed by your doctor. Limit salt intake, follow heart healthy meal plan.   Follow up with your doctor regarding your blood pressure in the next 1-2 weeks.  Return to ER if worse, new symptoms, chest pain, trouble breathing, or other concern.

## 2021-01-13 NOTE — ED Notes (Signed)
Pt verbalizes understanding of discharge instructions. Opportunity for questioning and answers were provided. Pt discharged from ED to home.   ? ?

## 2021-01-14 NOTE — Telephone Encounter (Signed)
Spoke with patient.  Advised that he increase olmesartan to 40 mg for 2 days then resume with 20 mg daily. Continue chlorthalidone 25 mg as well.  Continue to monitor home BP readings, will see him in office next Tuesday

## 2021-01-16 ENCOUNTER — Telehealth: Payer: Self-pay

## 2021-01-16 NOTE — Telephone Encounter (Signed)
Pt called and stated that he hadn't heard from kristin when he called earlier this morning. I advised the pt that she is in w/pts all day and that she would try to return the call asap. Routing to k. [place] rph

## 2021-01-16 NOTE — Telephone Encounter (Signed)
Patient continues to be concerned about elevated blood pressure readings.  Notes that the "brain fog" cleared up when he started cutting back on meds at the end of November, but feels that it returned yesterday after increasing the olmesartan to 40 mg.    Advised he re-start hydralazine at 50 mg bid, continue with olmesartan 20 and chlorthalidone 25.  He can take extra 50 mg hydralazine if systolic BP > 180 on 2 separate occasions.  Keep appointment set for Tuesday.

## 2021-01-21 ENCOUNTER — Other Ambulatory Visit: Payer: Self-pay

## 2021-01-21 ENCOUNTER — Encounter: Payer: Self-pay | Admitting: Pharmacist Clinician (PhC)/ Clinical Pharmacy Specialist

## 2021-01-21 ENCOUNTER — Ambulatory Visit (INDEPENDENT_AMBULATORY_CARE_PROVIDER_SITE_OTHER): Payer: Medicare Other | Admitting: Pharmacist Clinician (PhC)/ Clinical Pharmacy Specialist

## 2021-01-21 DIAGNOSIS — I10 Essential (primary) hypertension: Secondary | ICD-10-CM

## 2021-01-21 MED ORDER — VALSARTAN 320 MG PO TABS: 320.0000 mg | ORAL_TABLET | Freq: Every day | ORAL | 6 refills | Status: DC

## 2021-01-21 MED ORDER — HYDRALAZINE HCL 50 MG PO TABS: 50.0000 mg | ORAL_TABLET | Freq: Two times a day (BID) | ORAL | 6 refills | Status: DC

## 2021-01-21 NOTE — Progress Notes (Signed)
01/21/2021 Kevin [name] [birth year] [id number]   HPI:  Kevin [name] is a 76 y.o. male patient of Dr Gwenlyn Found, with a PMH below who presents today for hypertension clinic evaluation.  We have followed with Kevin [name] off and on over the past several years.  Today he notes that over the past 18 months his  pressure has slowly crept upwards, peaking about 0000000 systolic.  He had a telehealth visit with Almyra Deforest last December, at which time his hydralazine was increased to 100 mg three times daily.  Patient reports that this did little to improve blood pressure, and after a few months, the readings started to creep up further.  He saw Dr. Gwenlyn Found on Nov 1.  At that time his BP was 150/62 and amlodipine 2.5 mg daily was added.  He called in 2 weeks later, feeling poorly overall, with vertigo (even when seated) and dizziness.  His BP was up to 187/84.  He was advised to go to ED, as he noted the the symptoms had been worsening over the past week or two.  At that time the amlodipine was stopped and we started him on olmesartan 20 mg.  He then called a few days later stating that his symptoms were still present.  The information was reviewed with Dr. Gwenlyn Found and it was agreed that he needed to follow up with his PCP or ENT, as the symptoms did not seem to be cardiology related.  At his last visit with me, he was concerned about the "brain fog' and wanted to get off BP medications, to see if he could treat naturally with beet juice.  Agreed to a slow taper over 6 weeks to wean off medications and avoid rebound.  He called about 2 days after stopping last med (olmesartan), noting pressure back up to 155/70.  He restarted olmesartan and a few day later restarted the chlorthalidone.  A week later hydralazine was also restarted, 50 mg twice daily with extra 50 mg if systolic pressure > [id number].  He returns today for follow up.   Patient has been keeping notes on his BP and symptoms over the past few weeks  He believes the  olmesartan is the cause of his "brain fog".  Would like to switch off to something else.  Home BP readings are starting to improve, now mostly [id number] systolic.    Blood Pressure Goal:  130/80  Current Medications:  olmesartan 20 mg qd, chlorthalidone 25 mg qd, hydralazine 100 mg tid  Family Hx:  father had hypertension for many years, died after MI at age 2.  No cardiac history in mother.  One sister with hypertension.  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  Drinks 2-3 glasses of wine most evenings  Diet: mostly home cooked from scratch, no added salt  Exercise: limited due to vertigo   Home BP readings:  30 readings in past 5 weeks, average 155/73  (range 137-200/63-93)  Intolerances: nkda  Labs: 12/02/20:  Na 140, K 3.4, Glu 118, BUN 20, SCr 0.94 GFR >60   Wt Readings from Last 3 Encounters:  01/21/21 208 lb 6.4 oz (94.5 kg)  01/13/21 200 lb (90.7 kg)  12/02/20 200 lb (90.7 kg)   BP Readings from Last 3 Encounters:  01/21/21 (!) 146/68  01/13/21 (!) 177/62  12/17/20 (!) 128/52   Pulse Readings from Last 3 Encounters:  01/21/21 69  01/13/21 (!) 58  12/17/20 72  Current Outpatient Medications  Medication Sig Dispense Refill   amoxicillin (AMOXIL) 500 MG capsule Before dental procedures     budesonide (PULMICORT) 180 MCG/ACT inhaler Inhale 1 puff into the lungs as needed.      chlorthalidone (HYGROTON) 25 MG tablet Take 1 tablet (25 mg total) by mouth daily. **Keep upcoming appointment for future refills** 90 tablet 0   hydrALAZINE (APRESOLINE) 50 MG tablet Take 1 tablet (50 mg total) by mouth in the morning and at bedtime. 60 tablet 6   ipratropium (ATROVENT) 0.06 % nasal spray Place 1 spray into the nose as needed.     ketoconazole (NIZORAL) 2 % shampoo Apply 1 application topically as needed.     latanoprost (XALATAN) 0.005 % ophthalmic solution Place 1 drop into both eyes at bedtime.     levocetirizine (XYZAL) 5 MG tablet Take 1  tablet by mouth daily.     meloxicam (MOBIC) 7.5 MG tablet Take 1 tablet by mouth as needed.     Methylsulfonylmethane 1000 MG CAPS Take 2,000 mg by mouth daily.     Misc Natural Products (TART CHERRY ADVANCED PO) Take 2 capsules by mouth daily.     SYSTANE ULTRA 0.4-0.3 % SOLN Apply 1 drop to eye 3 (three) times daily.     traMADol (ULTRAM) 50 MG tablet Take 50 mg by mouth every 6 (six) hours as needed.     TURMERIC PO Take 2 capsules by mouth daily.     valsartan (DIOVAN) 320 MG tablet Take 1 tablet (320 mg total) by mouth daily. 30 tablet 6   White Petrolatum-Mineral Oil (SYSTANE NIGHTTIME) OINT SMARTSIG:1 Inch(es) In Eye(s) Every Night     No current facility-administered medications for this visit.    No Known Allergies  Past Medical History:  Diagnosis Date   Arthritis    osteoarthritis-hips/ shoulders- no issues at present   Complication of anesthesia    longer to wake up   Foley catheter in place 05/30/2013   PT HAD SURG 05/30/13 - CYSTO AND CLOT EVACUATION BLADDER / PROSTATE - UNABLE TO VOID AFTER DISCHARGE - CAME BACK TO ER SAME NIGHT AND HAD FOLEY INSERTED TO DRAINAGE BAG - URINE WAS BLOODY BUT PT REPORTS ON 06/07/13 THAT URINE IN BAG MOSTLY CLEAR NOW.   GERD (gastroesophageal reflux disease)    History of kidney stones    x1   Hypertension    Muscle tension dysphonia     Blood pressure (!) 146/68, pulse 69, resp. rate 16, height 5\' 9"  (1.753 m), weight 208 lb 6.4 oz (94.5 kg), SpO2 97 %.  Standing 130/54 (at standing), after 2 minutes 132/52  Essential hypertension Patient with essential hypertension, now back to baseline after having tried to cut back on medications.  Will have him discontinue olmesartan and start valsartan 320 mg once daily.  Continue with all other medications.  He will continue to monitor BP readings from home and we will see him back in a month for follow up.  If he tolerates this change we can consider switching hydralazine to amlodipine for easier  dosing.    Tommy Medal PharmD CPP [facility] [address] [place], [place] [address] [phone]

## 2021-01-21 NOTE — Patient Instructions (Signed)
Return for a a follow up appointment February 7 at 11 am   Check your blood pressure at home daily and keep record of the readings.  Take your BP meds as follows:  Stop olmesartan  Start valsartan 320 mg once daily  Continue with all other medications  Bring all of your meds, your BP cuff and your record of home blood pressures to your next appointment.  Exercise as youre able, try to walk approximately 30 minutes per day.  Keep salt intake to a minimum, especially watch canned and prepared boxed foods.  Eat more fresh fruits and vegetables and fewer canned items.  Avoid eating in fast food restaurants.    HOW TO TAKE YOUR BLOOD PRESSURE: Rest 5 minutes before taking your blood pressure.  Dont smoke or drink caffeinated beverages for at least 30 minutes before. Take your blood pressure before (not after) you eat. Sit comfortably with your back supported and both feet on the floor (dont cross your legs). Elevate your arm to heart level on a table or a desk. Use the proper sized cuff. It should fit smoothly and snugly around your bare upper arm. There should be enough room to slip a fingertip under the cuff. The bottom edge of the cuff should be 1 inch above the crease of the elbow. Ideally, take 3 measurements at one sitting and record the average.

## 2021-01-21 NOTE — Assessment & Plan Note (Addendum)
Patient with essential hypertension, now back to baseline after having tried to cut back on medications.  Will have him discontinue olmesartan and start valsartan 320 mg once daily.  Continue with all other medications.  He will continue to monitor BP readings from home and we will see him back in a month for follow up.  If he tolerates this change we can consider switching hydralazine to amlodipine for easier dosing.

## 2021-01-23 ENCOUNTER — Encounter: Payer: Self-pay | Admitting: Physical Therapy

## 2021-01-23 ENCOUNTER — Ambulatory Visit: Payer: Medicare Other | Admitting: Physical Therapy

## 2021-01-23 ENCOUNTER — Other Ambulatory Visit: Payer: Self-pay

## 2021-01-23 ENCOUNTER — Ambulatory Visit: Payer: Medicare Other | Attending: Otolaryngology | Admitting: Physical Therapy

## 2021-01-23 VITALS — BP 136/67 | HR 68

## 2021-01-23 DIAGNOSIS — R2681 Unsteadiness on feet: Secondary | ICD-10-CM | POA: Insufficient documentation

## 2021-01-23 DIAGNOSIS — R42 Dizziness and giddiness: Secondary | ICD-10-CM | POA: Diagnosis present

## 2021-01-23 DIAGNOSIS — H8112 Benign paroxysmal vertigo, left ear: Secondary | ICD-10-CM | POA: Insufficient documentation

## 2021-01-23 NOTE — Therapy (Signed)
[facility] [address] [place], [place], [address] Phone: [phone]   Fax:  [phone]  Physical Therapy Evaluation  Patient Details  Name: Kevin [name] MRN: [id number] Date of Birth: 11/18/1945 Referring Provider (PT): Raylene Miyamoto, MD   Encounter Date: 01/23/2021   PT End of Session - 01/23/21 1713     Visit Number 1    Number of Visits 17    Date for PT Re-Evaluation 03/20/21    Authorization Type Medicare    PT Start Time 1617    PT Stop Time 1705    PT Time Calculation (min) 48 min    Activity Tolerance Patient tolerated treatment well    Behavior During Therapy [facility] for tasks assessed/performed             Past Medical History:  Diagnosis Date   Arthritis    osteoarthritis-hips/ shoulders- no issues at present   Complication of anesthesia    longer to wake up   Foley catheter in place 05/30/2013   PT HAD SURG 05/30/13 - CYSTO AND CLOT EVACUATION BLADDER / PROSTATE - UNABLE TO VOID AFTER DISCHARGE - CAME BACK TO ER SAME NIGHT AND HAD FOLEY INSERTED TO DRAINAGE BAG - URINE WAS BLOODY BUT PT REPORTS ON 06/07/13 THAT URINE IN BAG MOSTLY CLEAR NOW.   GERD (gastroesophageal reflux disease)    History of kidney stones    x1   Hypertension    Muscle tension dysphonia     Past Surgical History:  Procedure Laterality Date   BACK SURGERY     lumbar    CYSTOSCOPY W/ URETEROSCOPY W/ LITHOTRIPSY     CYSTOSCOPY/RETROGRADE/URETEROSCOPY/STONE EXTRACTION WITH BASKET N/A 05/30/2013   Procedure: CYSTOSCOPY , BLADDER BIOPSY AND CLOT EVACUATION , CAUTERIZATIO OF LATERAL RIGHT AND LEFT LOBE OF PROSTATE;  Surgeon: Ailene Rud, MD;  Location: [facility];  Service: Urology;  Laterality: N/A;   HERNIA REPAIR     inguinal   INSERTION OF MESH N/A 11/24/2017   Procedure: INSERTION OF MESH;  Surgeon: Donnie Mesa, MD;  Location: [facility];  Service: General;  Laterality: N/A;   JOINT REPLACEMENT     '00-left/'08 RTHA   KNEE  ARTHROSCOPY     left knee meniscus surgery   PROSTATE SURGERY Bilateral    7- 8 yrs ago   SHOULDER ARTHROSCOPY Right    SHOULDER ARTHROTOMY Left    TRANSURETHRAL RESECTION OF PROSTATE N/A 06/09/2013   Procedure: TRANSURETHRAL RESECTION OF THE PROSTATE (TURP) WITH GYRUS;  Surgeon: Ailene Rud, MD;  Location: [facility];  Service: Urology;  Laterality: N/A;   UMBILICAL HERNIA REPAIR N/A 11/24/2017   Procedure: UMBILICAL HERNIA REPAIR WITH MESH;  Surgeon: Donnie Mesa, MD;  Location: [facility];  Service: General;  Laterality: N/A;    Vitals:   01/23/21 1635  BP: 136/67  Pulse: 68      Subjective Assessment - 01/23/21 1618     Subjective Patient reports dizziness for the past 5-6 months without known cause. Episodes last minutes, sometimes up to an hour. Describes as "vertigo, out of focus, imbalance." Worse with standing up quickly, getting out of bed. Has been having elevated BP and on meds for this. Denies head trauma, infection/illness, hearing loss, tinnitus, otalgia, migraines. Reports new onset of blurred vision. Reports going downstairs feels wavy and unsteady. Reports new glasses prescription in the past 6 months. Denies motion sickness until recently.    Pertinent History GERD, HTN, muscle tension dysphonia,  back surgery, R THA, L knee scope, R shoulder scope, prostate resection 2015    Limitations Standing;Walking;House hold activities    Diagnostic tests 12/02/20 head CT: No acute intracranial pathology. Chronic microvascular disease and cerebral atrophy.  12/02/20 brain MRI: No acute intracranial pathology. Mild chronic white matter microangiopathy and global parenchymal volume loss. Trace fluid in the left mastoid air cells    Patient Stated Goals decrease dizziness    Currently in Pain? No/denies   referral related to vertigo rather than pain               [facility] PT Assessment - 01/23/21 1623       Assessment   Medical Diagnosis Dizziness    Referring Provider (PT)  Raylene Miyamoto, MD    Onset Date/Surgical Date 07/23/20    Next MD Visit pt unsure    Prior Therapy yes      Precautions   Precautions None      Balance Screen   Has the patient fallen in the past 6 months No    Has the patient had a decrease in activity level because of a fear of falling?  No    Is the patient reluctant to leave their home because of a fear of falling?  No      Home Environment   Living Environment Private residence    Living Arrangements Spouse/significant other    Available Help at Discharge Family    Type of [place] to enter    Entrance Stairs-Number of Steps 4-5    Entrance Stairs-Rails Right    Home Layout Two level    Alternate Level Stairs-Rails Right;Left      Prior Function   Level of [place] Retired    Leisure none      Ambulation/Gait   Gait Pattern Step-through pattern    Ambulation Surface Level;Indoor    Gait velocity slightly decreased                    Vestibular Assessment - 01/23/21 1627       Vestibular Assessment   General Observation head turned slightly L at rest      Oculomotor Exam   Oculomotor Alignment Abnormal   L eye depressed   Ocular ROM WNL    Spontaneous Absent    Gaze-induced  Absent    Smooth Pursuits Intact   c/o dizziness vertically   Saccades Undershoots   mild undershooting vertically- normal for age   Comment convergence: blurriness ~1 ft away      Oculomotor Exam-Fixation Suppressed    Left Head Impulse positive    Right Head Impulse negative      Vestibulo-Ocular Reflex   VOR 1 Head Only (x 1 viewing) mild difficulty with gaze stabilization with L head turn and cervical flexion; c/o mild dizziness with vertical VOR    VOR Cancellation Normal   c/o mild dizziness     Positional Testing   Dix-Hallpike Dix-Hallpike Right;Dix-Hallpike Left    Sidelying Test Sidelying Right;Sidelying Left    Horizontal Canal Testing Horizontal Canal  Right;Horizontal Canal Left      Dix-Hallpike Left   Dix-Hallpike Left Duration >1 mi    Dix-Hallpike Left Symptoms Upbeat, left rotatory nystagmus   low amplitude; c/o dizziness     Sidelying Right   Sidelying Right Duration 0    Sidelying Right Symptoms No nystagmus   latent onset of dizziness,  nonfatiguing     Horizontal Canal Right   Horizontal Canal Right Duration 0    Horizontal Canal Right Symptoms Normal   transient dizziness upon rolling back to supine     Horizontal Canal Left   Horizontal Canal Left Duration 0    Horizontal Canal Left Symptoms Normal   transient dizziness upon rolling back to supine               Objective measurements completed on examination: See above findings.        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    Time 3    Period Weeks    Status New    Target Date 02/13/21               PT Long Term Goals - 01/23/21 1718       PT LONG TERM GOAL #1   Title Patient to be independent with advanced HEP.    Time 8    Period Weeks    Status New    Target Date 03/20/21      PT LONG TERM GOAL #2   Title Patient to report 0/10 dizziness with standing vertical and horizontal head movements at  variable speeds.    Time 8    Period Weeks    Status New    Target Date 03/20/21      PT LONG TERM GOAL #3   Title Patient will report 0/10 dizziness with bed mobility.    Time 8    Period Weeks    Status New    Target Date 03/20/21      PT LONG TERM GOAL #4   Title Patient to score at least 20/24 on DGI in order to decrease risk of falls.    Time 8    Period Weeks    Status New    Target Date 03/20/21      PT LONG TERM GOAL #5   Title Patient to report 70% improvement in dizziness.    Time 8    Period Weeks    Status New    Target Date 03/20/21                    Plan - 01/23/21 1713     Clinical Impression Statement Patient is a 76 y/o M presenting to OPPT with c/o dizziness for the past 5-6 months. Episodes last minutes, sometimes more, and aggravated by supine>sit and STS. Describes dizziness as "vertigo, out of focus, imbalance." Denies head trauma, infection/illness, hearing loss, tinnitus, otalgia, migraines.  Reports new onset of blurred vision, motion sickness, and symptoms of visual vertigo when walking downstairs. Reports getting a new glasses prescription in the past 6 months. 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 Positional testing was positive for dizziness with R/L rolling and R DH. L DH was positive for low amplitude nystagmus and dizziness. Treated with L Epley was tolerated well. Patient was thoroughly educated on etiology and anatomy of BPPV and vestibular hypofunction as well as HEP- patient reported understanding. Would benefit from skilled PT services 2x/week for 8 weeks to address aforementioned impairments in order to optimize level of function.    Personal Factors and Comorbidities Age;Comorbidity 3+;Time since onset of injury/illness/exacerbation;Past/Current Experience    Comorbidities GERD, HTN, muscle tension dysphonia, back  surgery, R THA, L knee scope, R shoulder scope, prostate resection 2015    Examination-Activity Limitations Bathing;Locomotion Level;Transfers;Bed Mobility;Reach Overhead;Bend;Sit;Carry;Sleep;Squat;Dressing;Stairs;Hygiene/Grooming;Stand;Lift;Toileting    Examination-Participation Restrictions Yard Work;Laundry;Driving;Community Activity;Cleaning;Church;Meal Prep    Stability/Clinical Decision Making Evolving/Moderate complexity    Clinical Decision Making Moderate    Rehab Potential Good    PT Frequency 2x / week    PT Duration 8 weeks    PT Treatment/Interventions ADLs/Self Care Home Management;Canalith Repostioning;Cryotherapy;DME Instruction;Moist Heat;Gait training;Stair training;Functional mobility training;Therapeutic activities;Therapeutic exercise;Balance training;Neuromuscular re-education;Manual techniques;Patient/family education;Passive range of motion;Dry needling;Vestibular;Taping    PT Next Visit Plan reassess HEP, R/L DH, assess orthostatics, progress VOR training, habituation, optokinetics    Consulted and Agree with Plan of Care Patient             Patient will benefit from skilled therapeutic intervention in order to improve the following deficits and impairments:  Dizziness, Decreased activity tolerance, Decreased balance  Visit Diagnosis: Dizziness and giddiness  Unsteadiness on feet  BPPV (benign paroxysmal positional vertigo), left     Problem List Patient Active Problem List   Diagnosis Date Noted   Arthritis 12/18/2020   Palpitations 04/27/2017   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, [place], [place], [address] Phone: [phone]   Fax:  [phone]  Name: ZESHAN WOODRUM MRN: [id number] Date of  Birth: [birth year]

## 2021-01-28 ENCOUNTER — Ambulatory Visit: Payer: Medicare Other | Admitting: Physical Therapy

## 2021-01-28 ENCOUNTER — Other Ambulatory Visit: Payer: Self-pay

## 2021-01-28 ENCOUNTER — Encounter: Payer: Self-pay | Admitting: Physical Therapy

## 2021-01-28 DIAGNOSIS — R2681 Unsteadiness on feet: Secondary | ICD-10-CM

## 2021-01-28 DIAGNOSIS — H8112 Benign paroxysmal vertigo, left ear: Secondary | ICD-10-CM

## 2021-01-28 DIAGNOSIS — R42 Dizziness and giddiness: Secondary | ICD-10-CM

## 2021-01-28 NOTE — Therapy (Signed)
[facility], [facility] [address] [place], [place], [address] Phone: [phone]   Fax:  [phone]  Physical Therapy Treatment  Patient Details  Name: Kevin [name] MRN: [id number] Date of Birth: [birth year] Referring Provider (PT): Karle Barr, MD   Encounter Date: 01/28/2021   PT End of Session - 01/28/21 1703     Visit Number 2    Number of Visits 17    Date for PT Re-Evaluation 03/20/21    Authorization Type Medicare    PT Start Time 1617    PT Stop Time 1701    PT Time Calculation (min) 44 min    Equipment Utilized During Treatment Gait belt    Activity Tolerance Patient tolerated treatment well    Behavior During Therapy [facility] for tasks assessed/performed             Past Medical History:  Diagnosis Date   Arthritis    osteoarthritis-hips/ shoulders- no issues at present   Complication of anesthesia    longer to wake up   Foley catheter in place 05/30/2013   PT HAD SURG 05/30/13 - CYSTO AND CLOT EVACUATION BLADDER / PROSTATE - UNABLE TO VOID AFTER DISCHARGE - CAME BACK TO ER SAME NIGHT AND HAD FOLEY INSERTED TO DRAINAGE BAG - URINE WAS BLOODY BUT PT REPORTS ON 06/07/13 THAT URINE IN BAG MOSTLY CLEAR NOW.   GERD (gastroesophageal reflux disease)    History of kidney stones    x1   Hypertension    Muscle tension dysphonia     Past Surgical History:  Procedure Laterality Date   BACK SURGERY     lumbar    CYSTOSCOPY W/ URETEROSCOPY W/ LITHOTRIPSY     CYSTOSCOPY/RETROGRADE/URETEROSCOPY/STONE EXTRACTION WITH BASKET N/A 05/30/2013   Procedure: CYSTOSCOPY , BLADDER BIOPSY AND CLOT EVACUATION , CAUTERIZATIO OF LATERAL RIGHT AND LEFT LOBE OF PROSTATE;  Surgeon: Kathi Ludwig, MD;  Location: [facility];  Service: Urology;  Laterality: N/A;   HERNIA REPAIR     inguinal   INSERTION OF MESH N/A 11/24/2017   Procedure: INSERTION OF MESH;  Surgeon: Manus Rudd, MD;  Location: [facility];  Service: General;  Laterality: N/A;    JOINT REPLACEMENT     '00-left/'08 RTHA   KNEE ARTHROSCOPY     left knee meniscus surgery   PROSTATE SURGERY Bilateral    7- 8 yrs ago   SHOULDER ARTHROSCOPY Right    SHOULDER ARTHROTOMY Left    TRANSURETHRAL RESECTION OF PROSTATE N/A 06/09/2013   Procedure: TRANSURETHRAL RESECTION OF THE PROSTATE (TURP) WITH GYRUS;  Surgeon: Kathi Ludwig, MD;  Location: [facility];  Service: Urology;  Laterality: N/A;   UMBILICAL HERNIA REPAIR N/A 11/24/2017   Procedure: UMBILICAL HERNIA REPAIR WITH MESH;  Surgeon: Manus Rudd, MD;  Location: [facility] OR;  Service: General;  Laterality: N/A;    There were no vitals filed for this visit.   Subjective Assessment - 01/28/21 1618     Subjective "I think there has been some improvement." still notes dizziness when getting out of bed in the AM, almost always.    Pertinent History GERD, HTN, muscle tension dysphonia, back surgery, R THA, L knee scope, R shoulder scope, prostate resection 2015    Diagnostic tests 12/02/20 head CT: No acute intracranial pathology. Chronic microvascular disease and cerebral atrophy.  12/02/20 brain MRI: No acute intracranial pathology. Mild chronic white matter microangiopathy and global parenchymal volume loss. Trace fluid in the left mastoid air cells  Patient Stated Goals decrease dizziness    Currently in Pain? No/denies                [facility] - 01/28/21 0001       Standardized Balance Assessment   Standardized Balance Assessment Dynamic Gait Index      Dynamic Gait Index   Level Surface Normal    Change in Gait Speed Normal    Gait with Horizontal Head Turns Mild Impairment    Gait with Vertical Head Turns Mild Impairment    Gait and Pivot Turn Normal    Step Over Obstacle Normal    Step Around Obstacles Mild Impairment    Steps Normal    Total Score 21    DGI comment: c/o L pulsion, especially with stairs and head turns                 Vestibular Assessment - 01/28/21 0001        Orthostatics   BP supine (x 5 minutes) 138/64    HR supine (x 5 minutes) 53    BP sitting 152/63    HR sitting 56    BP standing (after 1 minute) 142/65    HR standing (after 1 minute) 58    BP standing (after 3 minutes) 141/65    HR standing (after 3 minutes) 58    Orthostatics Comment negative                       Vestibular Treatment/Exercise - 01/28/21 0001       Vestibular Treatment/Exercise   Habituation Exercises Francee PiccoloBrandt Daroff   R/L rolling with EC 2x each with 5/10 dizziness   Gaze Exercises X1 Viewing Horizontal;X1 Viewing Vertical      Austin MilesBrandt Daroff   Number of Reps  2    Symptom Description  each side with EC; 4.5/10 dizziness      X1 Viewing Horizontal   Foot Position sitting, standing    Reps --   30"4   Comments 3/10      X1 Viewing Vertical   Foot Position sitting, standing    Reps --   30"   Comments cues to increase ROM; c/o 4/10                    PT Education - 01/28/21 1702     Education Details update to HEP-Access Code: [email or web address]; edu on today's tests and measures; answering patient's questions on his prognosis    Person(s) Educated Patient    Methods Explanation;Demonstration;Tactile cues;Verbal cues;Handout    Comprehension Verbalized understanding;Returned demonstration              PT Short Term Goals - 01/28/21 1708       PT SHORT TERM GOAL #1   Title Patient to be independent with initial HEP.    Time 3    Period Weeks    Status Achieved    Target Date 02/13/21               PT Long Term Goals - 01/28/21 1708       PT LONG TERM GOAL #1   Title Patient to be independent with advanced HEP.    Time 8    Period Weeks    Status On-going    Target Date 03/20/21      PT LONG TERM GOAL #2   Title Patient to report 0/10 dizziness with standing vertical and horizontal head movements  at variable speeds.    Time 8    Period Weeks    Status On-going    Target Date 03/20/21      PT LONG TERM GOAL  #3   Title Patient will report 0/10 dizziness with bed mobility.    Time 8    Period Weeks    Status On-going    Target Date 03/20/21      PT LONG TERM GOAL #4   Title Patient to score at least 20/24 on DGI in order to decrease risk of falls.    Time 8    Period Weeks    Status Achieved    Target Date 03/20/21      PT LONG TERM GOAL #5   Title Patient to report 70% improvement in dizziness.    Time 8    Period Weeks    Status On-going    Target Date 03/20/21                   Plan - 01/28/21 1703     Clinical Impression Statement Patient reporting improvement in dizziness since initial eval. However, still reports symptoms when getting up out of bed every morning. Assessed orthostatics, which were negative however patient with c/o mild dizziness during testing. Patient scored 21/24 on DGI, with c/o sensation of L pulsion, especially with stairs and head turns. Reviewed HEP and provide cues to adjust form slightly. Patient demonstrated good gaze stability and able to progress to standing. Updated HEP with new habituation activities that were practice today. Patient reported understanding and without complaints at end of session.    Personal Factors and Comorbidities Age;Comorbidity 3+;Time since onset of injury/illness/exacerbation;Past/Current Experience    Comorbidities GERD, HTN, muscle tension dysphonia, back surgery, R THA, L knee scope, R shoulder scope, prostate resection 2015    Examination-Activity Limitations Bathing;Locomotion Level;Transfers;Bed Mobility;Reach Overhead;Bend;Sit;Carry;Sleep;Squat;Dressing;Stairs;Hygiene/Grooming;Stand;Lift;Toileting    Examination-Participation Restrictions Yard Work;Laundry;Driving;Community Activity;Cleaning;Church;Meal Prep    Stability/Clinical Decision Making Evolving/Moderate complexity    Rehab Potential Good    PT Frequency 2x / week    PT Duration 8 weeks    PT Treatment/Interventions ADLs/Self Care Home  Management;Canalith Repostioning;Cryotherapy;DME Instruction;Moist Heat;Gait training;Stair training;Functional mobility training;Therapeutic activities;Therapeutic exercise;Balance training;Neuromuscular re-education;Manual techniques;Patient/family education;Passive range of motion;Dry needling;Vestibular;Taping    PT Next Visit Plan reassess HEP, R/L DH, progress VOR training, habituation, optokinetics    Consulted and Agree with Plan of Care Patient             Patient will benefit from skilled therapeutic intervention in order to improve the following deficits and impairments:  Dizziness, Decreased activity tolerance, Decreased balance  Visit Diagnosis: Dizziness and giddiness  Unsteadiness on feet  BPPV (benign paroxysmal positional vertigo), left     Problem List Patient Active Problem List   Diagnosis Date Noted   Arthritis 12/18/2020   Palpitations 04/27/2017   Moderate aortic insufficiency 04/27/2017   Blurred vision, bilateral 05/31/2014   Essential hypertension 01/25/2014   Benign prostatic hypertrophy 06/09/2013    Anette Guarneri, PT, DPT 01/28/21 5:09 PM   [facility] [address] [place], [place], [address] Phone: [phone]   Fax:  [phone]  Name: Kevin [name] MRN: [id number] Date of Birth: 1945-07-16

## 2021-01-30 ENCOUNTER — Ambulatory Visit: Payer: Medicare Other | Admitting: Physical Therapy

## 2021-01-30 ENCOUNTER — Other Ambulatory Visit: Payer: Self-pay

## 2021-01-30 ENCOUNTER — Encounter: Payer: Self-pay | Admitting: Physical Therapy

## 2021-01-30 DIAGNOSIS — R2681 Unsteadiness on feet: Secondary | ICD-10-CM

## 2021-01-30 DIAGNOSIS — R42 Dizziness and giddiness: Secondary | ICD-10-CM

## 2021-01-30 DIAGNOSIS — H8112 Benign paroxysmal vertigo, left ear: Secondary | ICD-10-CM

## 2021-01-30 NOTE — Therapy (Signed)
[facility] [address] [place], [place], [address] Phone: [phone]   Fax:  [phone]  Physical Therapy Treatment  Patient Details  Name: Kevin [name] MRN: [id number] Date of Birth: 1945-10-02 Referring Provider (PT): Raylene Miyamoto, MD   Encounter Date: 01/30/2021   PT End of Session - 01/30/21 1703     Visit Number 3    Number of Visits 17    Date for PT Re-Evaluation 03/20/21    Authorization Type Medicare    PT Start Time 1618    PT Stop Time 1656    PT Time Calculation (min) 38 min    Equipment Utilized During Treatment Gait belt    Activity Tolerance Patient tolerated treatment well    Behavior During Therapy [facility] for tasks assessed/performed             Past Medical History:  Diagnosis Date   Arthritis    osteoarthritis-hips/ shoulders- no issues at present   Complication of anesthesia    longer to wake up   Foley catheter in place 05/30/2013   PT HAD SURG 05/30/13 - CYSTO AND CLOT EVACUATION BLADDER / PROSTATE - UNABLE TO VOID AFTER DISCHARGE - CAME BACK TO ER SAME NIGHT AND HAD FOLEY INSERTED TO DRAINAGE BAG - URINE WAS BLOODY BUT PT REPORTS ON 06/07/13 THAT URINE IN BAG MOSTLY CLEAR NOW.   GERD (gastroesophageal reflux disease)    History of kidney stones    x1   Hypertension    Muscle tension dysphonia     Past Surgical History:  Procedure Laterality Date   BACK SURGERY     lumbar    CYSTOSCOPY W/ URETEROSCOPY W/ LITHOTRIPSY     CYSTOSCOPY/RETROGRADE/URETEROSCOPY/STONE EXTRACTION WITH BASKET N/A 05/30/2013   Procedure: CYSTOSCOPY , BLADDER BIOPSY AND CLOT EVACUATION , CAUTERIZATIO OF LATERAL RIGHT AND LEFT LOBE OF PROSTATE;  Surgeon: Ailene Rud, MD;  Location: [facility];  Service: Urology;  Laterality: N/A;   HERNIA REPAIR     inguinal   INSERTION OF MESH N/A 11/24/2017   Procedure: INSERTION OF MESH;  Surgeon: Donnie Mesa, MD;  Location: [place];  Service: General;  Laterality: N/A;    JOINT REPLACEMENT     '00-left/'08 RTHA   KNEE ARTHROSCOPY     left knee meniscus surgery   PROSTATE SURGERY Bilateral    7- 8 yrs ago   SHOULDER ARTHROSCOPY Right    SHOULDER ARTHROTOMY Left    TRANSURETHRAL RESECTION OF PROSTATE N/A 06/09/2013   Procedure: TRANSURETHRAL RESECTION OF THE PROSTATE (TURP) WITH GYRUS;  Surgeon: Ailene Rud, MD;  Location: [facility];  Service: Urology;  Laterality: N/A;   UMBILICAL HERNIA REPAIR N/A 11/24/2017   Procedure: UMBILICAL HERNIA REPAIR WITH MESH;  Surgeon: Donnie Mesa, MD;  Location: [place];  Service: General;  Laterality: N/A;    There were no vitals filed for this visit.   Subjective Assessment - 01/30/21 1619     Subjective Feeling okay. Not great but been worse. Feeling about the same as last time. Continues to perform fitness regimen including stretching, yoga, walking 3-4 miles a day, and sometims biking.    Pertinent History GERD, HTN, muscle tension dysphonia, back surgery, R THA, L knee scope, R shoulder scope, prostate resection 2015    Diagnostic tests 12/02/20 head CT: No acute intracranial pathology. Chronic microvascular disease and cerebral atrophy.  12/02/20 brain MRI: No acute intracranial pathology. Mild chronic white matter microangiopathy and global parenchymal volume  loss. Trace fluid in the left mastoid air cells    Patient Stated Goals decrease dizziness    Currently in Pain? No/denies                               [facility] Adult PT Treatment/Exercise - 01/30/21 0001       Neuro Re-ed    Neuro Re-ed Details  R/L forwd/back stepping + head nods to targets 10x each with CGA; 1/2 turns to targets + 4 alt toe tap on cone x10; R/L prop on elbow + gaze stabilization 10x   3-5/10 dizziness; mild instability            Vestibular Treatment/Exercise - 01/30/21 0001       Vestibular Treatment/Exercise   Habituation Exercises Standing Horizontal Head Turns;Standing Vertical Head Turns      Brandt  Daroff   Number of Reps  3    Symptom Description  R/L with c/o 3-4/10 dizziness   minor correction of position     Standing Horizontal Head Turns   Number of Reps  --   30" x2   Symptom Description  EC; mild-mod instability which improved with practice      Standing Vertical Head Turns   Number of Reps  --   30" x2   Symptom Description  EC; mild-mod instability which improved with practice                    PT Education - 01/30/21 1700     Education Details update to HEP-Access Code: [email or web address]; 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    Time 3    Period Weeks    Status Achieved    Target Date 02/13/21               PT Long Term Goals - 01/28/21 1708       PT LONG TERM GOAL #1   Title Patient to be independent with advanced HEP.    Time 8    Period Weeks    Status On-going    Target Date 03/20/21      PT LONG TERM GOAL #2   Title Patient to report 0/10 dizziness with standing vertical and horizontal head movements at variable speeds.    Time 8    Period Weeks    Status On-going    Target Date 03/20/21      PT LONG TERM GOAL #3   Title Patient will report 0/10 dizziness with bed mobility.    Time 8    Period Weeks    Status On-going    Target Date 03/20/21      PT LONG TERM GOAL #4   Title Patient to score at least 20/24 on DGI in order to decrease risk of falls.    Time 8    Period Weeks    Status Achieved    Target Date 03/20/21      PT LONG TERM GOAL #5   Title Patient to report 70% improvement in dizziness.    Time 8    Period Weeks  Status On-going    Target Date 03/20/21                   Plan - 01/30/21 1705     Clinical  Impression Statement Patient arrived to session without new complaints and reports continued compliance with his typical exercise regimen. 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 Reviewed Nestor Lewandowsky with minor correction of form required.  Initially with mild-mod instability with dynamic balance activities with EC, but quickly accommodated. Turning activities provoked most dizziness today; sit break required d/t symptoms. Updated HEP with turning at counter top for safety. Patient reported understanding and without complaints at end of session.    Personal Factors and Comorbidities Age;Comorbidity 3+;Time since onset of injury/illness/exacerbation;Past/Current Experience    Comorbidities GERD, HTN, muscle tension dysphonia, back surgery, R THA, L knee scope, R shoulder scope, prostate resection 2015    Examination-Activity Limitations Bathing;Locomotion Level;Transfers;Bed Mobility;Reach Overhead;Bend;Sit;Carry;Sleep;Squat;Dressing;Stairs;Hygiene/Grooming;Stand;Lift;Toileting    Examination-Participation Restrictions Yard Work;Laundry;Driving;Community Activity;Cleaning;Church;Meal Prep    Stability/Clinical Decision Making Evolving/Moderate complexity    Rehab Potential Good    PT Frequency 2x / week    PT Duration 8 weeks    PT Treatment/Interventions ADLs/Self Care Home Management;Canalith Repostioning;Cryotherapy;DME Instruction;Moist Heat;Gait training;Stair training;Functional mobility training;Therapeutic activities;Therapeutic exercise;Balance training;Neuromuscular re-education;Manual techniques;Patient/family education;Passive range of motion;Dry needling;Vestibular;Taping    PT Next Visit Plan reassess HEP, R/L DH, progress VOR training, habituation, optokinetics    Consulted and Agree with Plan of Care Patient             Patient will benefit from skilled therapeutic intervention in order to improve the  following deficits and impairments:  Dizziness, Decreased activity tolerance, Decreased balance  Visit Diagnosis: Dizziness and giddiness  Unsteadiness on feet  BPPV (benign paroxysmal positional vertigo), left     Problem List Patient Active Problem List   Diagnosis Date Noted   Arthritis 12/18/2020   Palpitations 04/27/2017   Moderate aortic insufficiency 04/27/2017   Blurred vision, bilateral 05/31/2014   Essential hypertension 01/25/2014   Benign prostatic hypertrophy 06/09/2013     Janene Harvey, PT, DPT 01/30/21 5:10 PM   [facility] [address] [place], [place], [address] Phone: [phone]   Fax:  [phone]  Name: Kevin [name] MRN: [id number] Date of Birth: 11/03/1945

## 2021-01-31 ENCOUNTER — Telehealth (HOSPITAL_BASED_OUTPATIENT_CLINIC_OR_DEPARTMENT_OTHER): Payer: Self-pay | Admitting: Pharmacist Clinician (PhC)/ Clinical Pharmacy Specialist

## 2021-01-31 NOTE — Telephone Encounter (Signed)
Returned call to patient.  He has noticed pattern in that he gets "foggy headed" about an hour after taking morning meds and it clears up around 4-5 pm.  Notes he has stopped tart cherry and turmeric to see if they were the cause.  Wants to know if he can switch BP medications to evening rather than morning to see if this will help.  Assured patient that he would be fine to switch meds to night.

## 2021-02-04 ENCOUNTER — Ambulatory Visit: Payer: Medicare Other | Admitting: Physical Therapy

## 2021-02-05 ENCOUNTER — Other Ambulatory Visit: Payer: Self-pay

## 2021-02-05 ENCOUNTER — Encounter: Payer: Self-pay | Admitting: Physical Therapy

## 2021-02-05 ENCOUNTER — Ambulatory Visit: Payer: Medicare Other | Admitting: Physical Therapy

## 2021-02-05 DIAGNOSIS — H8112 Benign paroxysmal vertigo, left ear: Secondary | ICD-10-CM

## 2021-02-05 DIAGNOSIS — R42 Dizziness and giddiness: Secondary | ICD-10-CM

## 2021-02-05 DIAGNOSIS — R2681 Unsteadiness on feet: Secondary | ICD-10-CM

## 2021-02-05 NOTE — Therapy (Signed)
[facility] [address], [address] [place], [place], [address] Phone: [phone]   Fax:  [phone]  Physical Therapy Treatment  Patient Details  Name: Kevin [name] MRN: [id number] Date of Birth: [birth year] Referring Provider (PT): Karle Barreoh, Su Wooi, MD   Encounter Date: 02/05/2021   PT End of Session - 02/05/21 1620     Visit Number 4    Number of Visits 17    Date for PT Re-Evaluation 03/20/21    Authorization Type Medicare    PT Start Time 1533    PT Stop Time 1611    PT Time Calculation (min) 38 min    Equipment Utilized During Treatment Gait belt    Activity Tolerance Patient tolerated treatment well    Behavior [facility] for tasks assessed/performed             Past Medical History:  Diagnosis Date   Arthritis    osteoarthritis-hips/ shoulders- no issues at present   Complication of anesthesia    longer to wake up   Foley catheter in place 05/30/2013   PT HAD SURG 05/30/13 - CYSTO AND CLOT EVACUATION BLADDER / PROSTATE - UNABLE TO VOID AFTER DISCHARGE - CAME BACK TO ER SAME NIGHT AND HAD FOLEY INSERTED TO DRAINAGE BAG - URINE WAS BLOODY BUT PT REPORTS ON 06/07/13 THAT URINE IN BAG MOSTLY CLEAR NOW.   GERD (gastroesophageal reflux disease)    History of kidney stones    x1   Hypertension    Muscle tension dysphonia     Past Surgical History:  Procedure Laterality Date   BACK SURGERY     lumbar    CYSTOSCOPY W/ URETEROSCOPY W/ LITHOTRIPSY     CYSTOSCOPY/RETROGRADE/URETEROSCOPY/STONE EXTRACTION WITH BASKET N/A 05/30/2013   Procedure: CYSTOSCOPY , BLADDER BIOPSY AND CLOT EVACUATION , CAUTERIZATIO OF LATERAL RIGHT AND LEFT LOBE OF PROSTATE;  Surgeon: Kathi LudwigSigmund I Tannenbaum, MD;  Location: [facility];  Service: Urology;  Laterality: N/A;   HERNIA REPAIR     inguinal   INSERTION OF MESH N/A 11/24/2017   Procedure: INSERTION OF MESH;  Surgeon: Manus Ruddsuei, Matthew, MD;  Location: [facility] OR;  Service: General;  Laterality: N/A;    JOINT REPLACEMENT     '00-left/'08 RTHA   KNEE ARTHROSCOPY     left knee meniscus surgery   PROSTATE SURGERY Bilateral    7- 8 yrs ago   SHOULDER ARTHROSCOPY Right    SHOULDER ARTHROTOMY Left    TRANSURETHRAL RESECTION OF PROSTATE N/A 06/09/2013   Procedure: TRANSURETHRAL RESECTION OF THE PROSTATE (TURP) WITH GYRUS;  Surgeon: Kathi LudwigSigmund I Tannenbaum, MD;  Location: [facility];  Service: Urology;  Laterality: N/A;   UMBILICAL HERNIA REPAIR N/A 11/24/2017   Procedure: UMBILICAL HERNIA REPAIR WITH MESH;  Surgeon: Manus Ruddsuei, Matthew, MD;  [facility];  Service: General;  Laterality: N/A;    There were no vitals filed for this visit.   Subjective Assessment - 02/05/21 1534     Subjective "I think it's getting better." First thing out of bed, he has to wait 30-45 sec and this is when it occurs the most severely.    Pertinent History GERD, HTN, muscle tension dysphonia, back surgery, R THA, L knee scope, R shoulder scope, prostate resection 2015    Diagnostic tests 12/02/20 head CT: No acute intracranial pathology. Chronic microvascular disease and cerebral atrophy.  12/02/20 brain MRI: No acute intracranial pathology. Mild chronic white matter microangiopathy and global parenchymal volume loss. Trace fluid in the  left mastoid air cells    Patient Stated Goals decrease dizziness    Currently in Pain? No/denies                     Vestibular Assessment - 02/05/21 0001       Dix-Hallpike Right   Dix-Hallpike Right Duration 20    Dix-Hallpike Right Symptoms Upbeat, right rotatory nystagmus   VERY small amplitude; no latency of dizziness- possible motion sensitivity     Dix-Hallpike Left   Dix-Hallpike Left Duration 0    Dix-Hallpike Left Symptoms No nystagmus   c/o mild dizziness     Horizontal Canal Right   Horizontal Canal Right Duration 0    Horizontal Canal Right Symptoms Normal      Horizontal Canal Left   Horizontal Canal Left Duration 20   possible L upbeating torsional  nystagmus but without dizziness   Horizontal Canal Left Symptoms Normal                      [facility] Adult PT Treatment/Exercise - 02/05/21 0001       Neuro Re-ed    Neuro Re-ed Details  standing wide on foam + EC head turn/nods 30" each   CGA-min A d/t imbalance            Vestibular Treatment/Exercise - 02/05/21 0001       Vestibular Treatment/Exercise   Canalith Repositioning Epley Manuever Right       EPLEY MANUEVER RIGHT   Number of Reps  1    Overall Response Improved Symptoms    Response Details  tolerated well      Austin Miles   Number of Reps  3    Symptom Description  R/L with c/o 4/10 dizziness      X1 Viewing Horizontal   Foot Position standing in front of blinds    Reps --   30" x2   Comments 4/10 + dizziness   cues to slow down     X1 Viewing Vertical   Foot Position standing in front of blinds    Reps --   30" x2   Comments 3/10 dizziness                    PT Education - 02/05/21 1620     Education Details update to HEP-Access Code: [email or web address]    Person(s) Educated Patient    Methods Explanation;Demonstration;Tactile cues;Verbal cues;Handout    Comprehension Returned demonstration;Verbalized understanding              PT Short Term Goals - 01/28/21 1708       PT SHORT TERM GOAL #1   Title Patient to be independent with initial HEP.    Time 3    Period Weeks    Status Achieved    Target Date 02/13/21               PT Long Term Goals - 01/28/21 1708       PT LONG TERM GOAL #1   Title Patient to be independent with advanced HEP.    Time 8    Period Weeks    Status On-going    Target Date 03/20/21      PT LONG TERM GOAL #2   Title Patient to report 0/10 dizziness with standing vertical and horizontal head movements at variable speeds.    Time 8    Period Weeks    Status On-going  Target Date 03/20/21      PT LONG TERM GOAL #3   Title Patient will report 0/10 dizziness with bed mobility.     Time 8    Period Weeks    Status On-going    Target Date 03/20/21      PT LONG TERM GOAL #4   Title Patient to score at least 20/24 on DGI in order to decrease risk of falls.    Time 8    Period Weeks    Status Achieved    Target Date 03/20/21      PT LONG TERM GOAL #5   Title Patient to report 70% improvement in dizziness.    Time 8    Period Weeks    Status On-going    Target Date 03/20/21                   Plan - 02/05/21 1620     Clinical Impression Statement Patient arrived to session with report of improving dizziness. Still notes that sitting up in the AM still brings on the most dizziness. Able to progress habituation to [facility] today, which patient reports is more challenging than EO. Also able to progress VOR with optokinetic background; patient required cues to slow down to allow for improved visual fixation and decrease in symptoms. Updated HEP with these changes- patient reported understanding. Reassessed positional testing, which revealed possible L upbeating torsional nystagmus without dizziness upon L roll test and R upbeating torsional nystagmus with R Epley with c/o mild dizziness. Unclear if truly BPPV vs. motion sensitivity, however treated patient with R DH per symptoms. Patient tolerated session well and without complaints upon leaving.    Personal Factors and Comorbidities Age;Comorbidity 3+;Time since onset of injury/illness/exacerbation;Past/Current Experience    Comorbidities GERD, HTN, muscle tension dysphonia, back surgery, R THA, L knee scope, R shoulder scope, prostate resection 2015    Examination-Activity Limitations Bathing;Locomotion Level;Transfers;Bed Mobility;Reach Overhead;Bend;Sit;Carry;Sleep;Squat;Dressing;Stairs;Hygiene/Grooming;Stand;Lift;Toileting    Examination-Participation Restrictions Yard Work;Laundry;Driving;Community Activity;Cleaning;Church;Meal Prep    Stability/Clinical Decision Making Evolving/Moderate complexity    Rehab  Potential Good    PT Frequency 2x / week    PT Duration 8 weeks    PT Treatment/Interventions ADLs/Self Care Home Management;Canalith Repostioning;Cryotherapy;DME Instruction;Moist Heat;Gait training;Stair training;Functional mobility training;Therapeutic activities;Therapeutic exercise;Balance training;Neuromuscular re-education;Manual techniques;Patient/family education;Passive range of motion;Dry needling;Vestibular;Taping    PT Next Visit Plan reassess HEP, R/L DH, progress VOR training, habituation, optokinetics    Consulted and Agree with Plan of Care Patient             Patient will benefit from skilled therapeutic intervention in order to improve the following deficits and impairments:  Dizziness, Decreased activity tolerance, Decreased balance  Visit Diagnosis: Dizziness and giddiness  Unsteadiness on feet  BPPV (benign paroxysmal positional vertigo), left     Problem List Patient Active Problem List   Diagnosis Date Noted   Arthritis 12/18/2020   Palpitations 04/27/2017   Moderate aortic insufficiency 04/27/2017   Blurred vision, bilateral 05/31/2014   Essential hypertension 01/25/2014   Benign prostatic hypertrophy 06/09/2013    Anette Guarneri, PT, DPT 02/05/21 4:22 PM   [facility] [address], [address] [place], [place], [address] Phone: [phone]   Fax:  [phone]  Name: Kevin [name] MRN: [id number] Date of Birth: 05/06/1945

## 2021-02-06 ENCOUNTER — Ambulatory Visit: Payer: Medicare Other | Admitting: Rehabilitative and Restorative Service Providers"

## 2021-02-11 ENCOUNTER — Ambulatory Visit: Payer: Medicare Other | Admitting: Physical Therapy

## 2021-02-11 ENCOUNTER — Other Ambulatory Visit: Payer: Self-pay

## 2021-02-11 ENCOUNTER — Encounter: Payer: Self-pay | Admitting: Physical Therapy

## 2021-02-11 DIAGNOSIS — R42 Dizziness and giddiness: Secondary | ICD-10-CM

## 2021-02-11 DIAGNOSIS — R2681 Unsteadiness on feet: Secondary | ICD-10-CM

## 2021-02-11 DIAGNOSIS — H8112 Benign paroxysmal vertigo, left ear: Secondary | ICD-10-CM

## 2021-02-11 NOTE — Therapy (Signed)
[facility] [address] [place], [place], [address] Phone: [phone]   Fax:  [phone]  Physical Therapy Treatment  Patient Details  Name: Kevin [name] MRN: [id number] Date of Birth: October 06, 1945 Referring Provider (PT): Karle Barr, MD   Encounter Date: 02/11/2021   PT End of Session - 02/11/21 1703     Visit Number 5    Number of Visits 17    Date for PT Re-Evaluation 03/20/21    Authorization Type Medicare    PT Start Time 1617    PT Stop Time 1655    PT Time Calculation (min) 38 min    Equipment Utilized During Treatment Gait belt    Activity Tolerance Patient tolerated treatment well    Behavior During Therapy [facility] for tasks assessed/performed             Past Medical History:  Diagnosis Date   Arthritis    osteoarthritis-hips/ shoulders- no issues at present   Complication of anesthesia    longer to wake up   Foley catheter in place 05/30/2013   PT HAD SURG 05/30/13 - CYSTO AND CLOT EVACUATION BLADDER / PROSTATE - UNABLE TO VOID AFTER DISCHARGE - CAME BACK TO ER SAME NIGHT AND HAD FOLEY INSERTED TO DRAINAGE BAG - URINE WAS BLOODY BUT PT REPORTS ON 06/07/13 THAT URINE IN BAG MOSTLY CLEAR NOW.   GERD (gastroesophageal reflux disease)    History of kidney stones    x1   Hypertension    Muscle tension dysphonia     Past Surgical History:  Procedure Laterality Date   BACK SURGERY     lumbar    CYSTOSCOPY W/ URETEROSCOPY W/ LITHOTRIPSY     CYSTOSCOPY/RETROGRADE/URETEROSCOPY/STONE EXTRACTION WITH BASKET N/A 05/30/2013   Procedure: CYSTOSCOPY , BLADDER BIOPSY AND CLOT EVACUATION , CAUTERIZATIO OF LATERAL RIGHT AND LEFT LOBE OF PROSTATE;  Surgeon: Kathi Ludwig, MD;  Location: [facility];  Service: Urology;  Laterality: N/A;   HERNIA REPAIR     inguinal   INSERTION OF MESH N/A 11/24/2017   Procedure: INSERTION OF MESH;  Surgeon: Manus Rudd, MD;  Location: [facility];  Service: General;  Laterality: N/A;    JOINT REPLACEMENT     '00-left/'08 RTHA   KNEE ARTHROSCOPY     left knee meniscus surgery   PROSTATE SURGERY Bilateral    7- 8 yrs ago   SHOULDER ARTHROSCOPY Right    SHOULDER ARTHROTOMY Left    TRANSURETHRAL RESECTION OF PROSTATE N/A 06/09/2013   Procedure: TRANSURETHRAL RESECTION OF THE PROSTATE (TURP) WITH GYRUS;  Surgeon: Kathi Ludwig, MD;  Location: [facility];  Service: Urology;  Laterality: N/A;   UMBILICAL HERNIA REPAIR N/A 11/24/2017   Procedure: UMBILICAL HERNIA REPAIR WITH MESH;  Surgeon: Manus Rudd, MD;  Location: [facility];  Service: General;  Laterality: N/A;    There were no vitals filed for this visit.   Subjective Assessment - 02/11/21 1618     Subjective Continues to report decreased dizziness and good progress. Getting out of bed in the AM and walking in the dark is getting better.    Pertinent History GERD, HTN, muscle tension dysphonia, back surgery, R THA, L knee scope, R shoulder scope, prostate resection 2015    Diagnostic tests 12/02/20 head CT: No acute intracranial pathology. Chronic microvascular disease and cerebral atrophy.  12/02/20 brain MRI: No acute intracranial pathology. Mild chronic white matter microangiopathy and global parenchymal volume loss. Trace fluid in the left mastoid  air cells    Patient Stated Goals decrease dizziness    Currently in Pain? No/denies                               [facility] Adult PT Treatment/Exercise - 02/11/21 0001       Neuro Re-ed    Neuro Re-ed Details  D2 flexion with green medball to cone on floor 10x each- c/o 3-4/10 dizziness; R/L lateral step downs on foam 10x, 10x + optokinetic backgroud             Vestibular Treatment/Exercise - 02/11/21 0001       Vestibular Treatment/Exercise   Habituation Exercises Horizontal Roll;Brandt Daroff      Francee PiccoloBrandt Daroff   Number of Reps  2    Symptom Description  2-3/10 dizziness with EC   cues to increase speed     Horizontal Roll   Number of  Reps  2    Symptom Description  EC; 2x each; 1/10 dizziness      X1 Viewing Horizontal   Foot Position standing wide on foam    Reps --   30" x2   Comments 2/10 dizziness      X1 Viewing Vertical   Foot Position standing wide on foam    Reps --   30" x2   Comments 1/10 dizziness                Balance Exercises - 02/11/21 0001       Balance Exercises: Standing   Standing Eyes Closed Foam/compliant surface;2 reps;30 secs;Limitations    Standing Eyes Closed Limitations EC; mild sway    Other Standing Exercises Comments romberg EC + head turns/nods 30"                PT Education - 02/11/21 1703     Education Details update to HEP-Access Code: [email or web address]    Person(s) Educated Patient    Methods Explanation;Demonstration;Tactile cues;Verbal cues;Handout    Comprehension Verbalized understanding;Returned demonstration              PT Short Term Goals - 01/28/21 1708       PT SHORT TERM GOAL #1   Title Patient to be independent with initial HEP.    Time 3    Period Weeks    Status Achieved    Target Date 02/13/21               PT Long Term Goals - 01/28/21 1708       PT LONG TERM GOAL #1   Title Patient to be independent with advanced HEP.    Time 8    Period Weeks    Status On-going    Target Date 03/20/21      PT LONG TERM GOAL #2   Title Patient to report 0/10 dizziness with standing vertical and horizontal head movements at variable speeds.    Time 8    Period Weeks    Status On-going    Target Date 03/20/21      PT LONG TERM GOAL #3   Title Patient will report 0/10 dizziness with bed mobility.    Time 8    Period Weeks    Status On-going    Target Date 03/20/21      PT LONG TERM GOAL #4   Title Patient to score at least 20/24 on DGI in order to decrease risk of falls.  Time 8    Period Weeks    Status Achieved    Target Date 03/20/21      PT LONG TERM GOAL #5   Title Patient to report 70% improvement in dizziness.     Time 8    Period Weeks    Status On-going    Target Date 03/20/21                   Plan - 02/11/21 1704     Clinical Impression Statement Patient arrived to session with report of continued improvement in dizziness and good progress. Getting out of bed in the AM and walking in the dark is getting better. Patient continues to report mild dizziness with habituation with EC, however improving. Good improvement in tolerance for VOR activities on foam today. Still some unsteadiness with EC and foam and EC with head turn activities. Provided optokinetic challenge wit balance activities which patient tolerated fairly well. Patient reported understanding of HEP update and without complaints at end of session.    Personal Factors and Comorbidities Age;Comorbidity 3+;Time since onset of injury/illness/exacerbation;Past/Current Experience    Comorbidities GERD, HTN, muscle tension dysphonia, back surgery, R THA, L knee scope, R shoulder scope, prostate resection 2015    Examination-Activity Limitations Bathing;Locomotion Level;Transfers;Bed Mobility;Reach Overhead;Bend;Sit;Carry;Sleep;Squat;Dressing;Stairs;Hygiene/Grooming;Stand;Lift;Toileting    Examination-Participation Restrictions Yard Work;Laundry;Driving;Community Activity;Cleaning;Church;Meal Prep    Stability/Clinical Decision Making Evolving/Moderate complexity    Rehab Potential Good    PT Frequency 2x / week    PT Duration 8 weeks    PT Treatment/Interventions ADLs/Self Care Home Management;Canalith Repostioning;Cryotherapy;DME Instruction;Moist Heat;Gait training;Stair training;Functional mobility training;Therapeutic activities;Therapeutic exercise;Balance training;Neuromuscular re-education;Manual techniques;Patient/family education;Passive range of motion;Dry needling;Vestibular;Taping    PT Next Visit Plan progress VOR training, habituation, optokinetics    Consulted and Agree with Plan of Care Patient             Patient  will benefit from skilled therapeutic intervention in order to improve the following deficits and impairments:  Dizziness, Decreased activity tolerance, Decreased balance  Visit Diagnosis: Dizziness and giddiness  Unsteadiness on feet  BPPV (benign paroxysmal positional vertigo), left     Problem List Patient Active Problem List   Diagnosis Date Noted   Arthritis 12/18/2020   Palpitations 04/27/2017   Moderate aortic insufficiency 04/27/2017   Blurred vision, bilateral 05/31/2014   Essential hypertension 01/25/2014   Benign prostatic hypertrophy 06/09/2013    Anette Guarneri, PT, DPT 02/11/21 5:05 PM   [facility] [address] [place], [place], [address] Phone: [phone]   Fax:  [phone]  Name: Kevin [name] MRN: [id number] Date of Birth: May 17, 1945

## 2021-02-13 ENCOUNTER — Ambulatory Visit: Payer: Medicare Other | Admitting: Physical Therapy

## 2021-02-16 ENCOUNTER — Other Ambulatory Visit: Payer: Self-pay

## 2021-02-16 ENCOUNTER — Encounter (HOSPITAL_BASED_OUTPATIENT_CLINIC_OR_DEPARTMENT_OTHER): Payer: Self-pay

## 2021-02-16 ENCOUNTER — Emergency Department (HOSPITAL_BASED_OUTPATIENT_CLINIC_OR_DEPARTMENT_OTHER)
Admission: EM | Admit: 2021-02-16 | Discharge: 2021-02-16 | Disposition: A | Discharge status: Home / Self Care | Payer: Medicare Other | Attending: Emergency Medicine | Admitting: Emergency Medicine

## 2021-02-16 DIAGNOSIS — D696 Thrombocytopenia, unspecified: Secondary | ICD-10-CM | POA: Diagnosis not present

## 2021-02-16 DIAGNOSIS — R519 Headache, unspecified: Secondary | ICD-10-CM | POA: Diagnosis not present

## 2021-02-16 DIAGNOSIS — I10 Essential (primary) hypertension: Secondary | ICD-10-CM | POA: Insufficient documentation

## 2021-02-16 DIAGNOSIS — Z79899 Other long term (current) drug therapy: Secondary | ICD-10-CM | POA: Diagnosis not present

## 2021-02-16 DIAGNOSIS — F1010 Alcohol abuse, uncomplicated: Secondary | ICD-10-CM | POA: Diagnosis not present

## 2021-02-16 DIAGNOSIS — F419 Anxiety disorder, unspecified: Secondary | ICD-10-CM | POA: Diagnosis not present

## 2021-02-16 DIAGNOSIS — R030 Elevated blood-pressure reading, without diagnosis of hypertension: Secondary | ICD-10-CM

## 2021-02-16 LAB — CBC
HCT: 40.6 % (ref 39.0–52.0)
Hemoglobin: 14.6 g/dL (ref 13.0–17.0)
MCH: 31 pg (ref 26.0–34.0)
MCHC: 36 g/dL (ref 30.0–36.0)
MCV: 86.2 fL (ref 80.0–100.0)
Platelets: 123 10*3/uL — ABNORMAL LOW (ref 150–400)
RBC: 4.71 MIL/uL (ref 4.22–5.81)
RDW: 12.9 % (ref 11.5–15.5)
WBC: 5.4 10*3/uL (ref 4.0–10.5)
nRBC: 0 % (ref 0.0–0.2)

## 2021-02-16 LAB — BASIC METABOLIC PANEL
Anion gap: 7 (ref 5–15)
BUN: 23 mg/dL (ref 8–23)
CO2: 29 mmol/L (ref 22–32)
Calcium: 8.9 mg/dL (ref 8.9–10.3)
Chloride: 103 mmol/L (ref 98–111)
Creatinine, Ser: 0.98 mg/dL (ref 0.61–1.24)
GFR, Estimated: >60 mL/min (ref >60)
Glucose, Bld: 118 mg/dL — ABNORMAL HIGH (ref 70–99)
Potassium: 3.4 mmol/L — ABNORMAL LOW (ref 3.5–5.1)
Sodium: 139 mmol/L (ref 135–145)

## 2021-02-16 LAB — MAGNESIUM: Magnesium: 1.9 mg/dL (ref 1.7–2.4)

## 2021-02-16 MED ORDER — SODIUM CHLORIDE 0.9 % IV BOLUS
1000.0000 mL | Freq: Once | INTRAVENOUS | Status: AC
  Administered 2021-02-16: 1000 mL via INTRAVENOUS

## 2021-02-16 MED ORDER — POTASSIUM CHLORIDE CRYS ER 20 MEQ PO TBCR
40.0000 meq | EXTENDED_RELEASE_TABLET | Freq: Once | ORAL | Status: AC
Start: 2021-02-16 — End: 2021-02-16
  Administered 2021-02-16: 40 meq via ORAL
  Filled 2021-02-16: qty 2

## 2021-02-16 MED ORDER — DIPHENHYDRAMINE HCL 50 MG/ML IJ SOLN
25.0000 mg | Freq: Once | INTRAMUSCULAR | Status: AC
Start: 2021-02-16 — End: 2021-02-16
  Administered 2021-02-16: 25 mg via INTRAVENOUS
  Filled 2021-02-16: qty 1

## 2021-02-16 MED ORDER — HYDRALAZINE HCL 25 MG PO TABS
100.0000 mg | ORAL_TABLET | Freq: Once | ORAL | Status: AC
  Administered 2021-02-16: 100 mg via ORAL
  Filled 2021-02-16: qty 4

## 2021-02-16 MED ORDER — CENTRUM PO CHEW: 1.0000 | CHEWABLE_TABLET | Freq: Every day | ORAL | 0 refills | Status: AC

## 2021-02-16 MED ORDER — HYDROXYZINE HCL 25 MG PO TABS: 25.0000 mg | ORAL_TABLET | Freq: Four times a day (QID) | ORAL | 0 refills | Status: DC | PRN

## 2021-02-16 MED ORDER — METOCLOPRAMIDE HCL 5 MG/ML IJ SOLN
5.0000 mg | Freq: Once | INTRAMUSCULAR | Status: AC
  Administered 2021-02-16: 5 mg via INTRAVENOUS
  Filled 2021-02-16: qty 2

## 2021-02-16 NOTE — Discharge Instructions (Addendum)
It was a pleasure caring for you today in the emergency department. ° °Please return to the emergency department for any worsening or worrisome symptoms. ° ° °

## 2021-02-16 NOTE — ED Provider Notes (Signed)
[facility] EMERGENCY DEPT Provider Note   CSN: [id number] Arrival date & time: 02/16/21  0732     History  Chief Complaint  Patient presents with   Hypertension    Kevin [name] is a 76 y.o. male.  This is a 76 y.o. male with significant medical history as below, including HTN, aortic regurgitation, GERD who presents to the ED with complaint of elevated BP. Per chart review this appears to be an ongoing problem for the patient, his BP medications have been adjusted recently by his cardiology office. Elevated bp this morning around [id number] systolic; a/w mild bitemporal HA. No vision or hearing changes, no numbness or tingling. No dizziness. Pt received MRI brain 11/14 a/w prior headache a/w HTN/aortic regurg which was reviewed by myself and shows some mild chronic changes but no acute findings. Follows w/ Dr Gwenlyn Found cardiology.   Reports compliance with anti-hypertensive medications.  He took them this morning.  Patient denies recent stimulant use.  No tobacco use.  Patient does report drinking daily, 2+ glasses of wine daily.  Reports last night he "drank a little more than normal."  Did not take any medications up with his headaches morning.  No nausea, vomiting, chest pain, dyspnea, abdominal pain, nausea or vomiting.  No diaphoresis.  No palpitations.  No gait disturbance.  Symptoms worsen with anxiousness.  Somewhat improved with rest, sitting still with eyes closed  Accompanied by spouse    Past Medical History: No date: Arthritis     Comment:  osteoarthritis-hips/ shoulders- no issues at present No date: Complication of anesthesia     Comment:  longer to wake up 05/30/2013: Foley catheter in place     Comment:  PT HAD SURG 05/30/13 - CYSTO AND CLOT EVACUATION BLADDER               / PROSTATE - UNABLE TO VOID AFTER DISCHARGE - CAME BACK               TO ER SAME NIGHT AND HAD FOLEY INSERTED TO DRAINAGE BAG -              URINE WAS BLOODY BUT PT REPORTS ON 06/07/13  THAT URINE IN               BAG MOSTLY CLEAR NOW. 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  Past Surgical History: No date: BACK SURGERY     Comment:  lumbar  No date: CYSTOSCOPY W/ URETEROSCOPY W/ LITHOTRIPSY 05/30/2013: CYSTOSCOPY/RETROGRADE/URETEROSCOPY/STONE EXTRACTION WITH  BASKET; N/A     Comment:  Procedure: CYSTOSCOPY , BLADDER BIOPSY AND CLOT               EVACUATION , CAUTERIZATIO OF LATERAL RIGHT AND LEFT LOBE               OF PROSTATE;  Surgeon: Ailene Rud, MD;                Location: [facility];  Service: Urology;  Laterality: N/A; No date: HERNIA REPAIR     Comment:  inguinal 11/24/2017: INSERTION OF MESH; N/A     Comment:  Procedure: INSERTION OF MESH;  Surgeon: Donnie Mesa,               MD;  Location: [facility];  Service: General;  Laterality:  N/A; No date: JOINT REPLACEMENT     Comment:  '00-left/'08 RTHA No date: KNEE ARTHROSCOPY     Comment:  left knee meniscus surgery No date: PROSTATE SURGERY; Bilateral     Comment:  7- 8 yrs ago No date: SHOULDER ARTHROSCOPY; Right No date: SHOULDER ARTHROTOMY; Left 06/09/2013: TRANSURETHRAL RESECTION OF PROSTATE; N/A     Comment:  Procedure: TRANSURETHRAL RESECTION OF THE PROSTATE               (TURP) WITH GYRUS;  Surgeon: Ailene Rud, MD;                Location: [facility];  Service: Urology;  Laterality: N/A; [id number]: UMBILICAL HERNIA REPAIR; N/A     Comment:  Procedure: UMBILICAL HERNIA REPAIR WITH MESH;  Surgeon:               Donnie Mesa, MD;  Location: [facility];  Service: General;               Laterality: N/A;    The history is provided by the patient. No language interpreter was used.      Home Medications Prior to Admission medications   Medication Sig Start Date End Date Taking? Authorizing Provider  hydrOXYzine (ATARAX) 25 MG tablet Take 1 tablet (25 mg total) by  mouth every 6 (six) hours as needed for anxiety. 02/16/21  Yes Jeanell Sparrow, DO  multivitamin-iron-minerals-folic acid (CENTRUM) chewable tablet Chew 1 tablet by mouth daily. 02/16/21 03/18/21 Yes Jeanell Sparrow, DO  amoxicillin (AMOXIL) 500 MG capsule Before dental procedures 09/06/19   [provider]  budesonide (PULMICORT) 180 MCG/ACT inhaler Inhale 1 puff into the lungs as needed.     [provider]  chlorthalidone (HYGROTON) 25 MG tablet Take 1 tablet (25 mg total) by mouth daily. **Keep upcoming appointment for future refills** 01/07/21   Lorretta Harp, MD  hydrALAZINE (APRESOLINE) 50 MG tablet Take 1 tablet (50 mg total) by mouth in the morning and at bedtime. 01/21/21   Lorretta Harp, MD  ipratropium (ATROVENT) 0.06 % nasal spray Place 1 spray into the nose as needed. 12/24/20   [provider]  ketoconazole (NIZORAL) 2 % shampoo Apply 1 application topically as needed. 08/15/20   [provider]  latanoprost (XALATAN) 0.005 % ophthalmic solution Place 1 drop into both eyes at bedtime.    [provider]  levocetirizine (XYZAL) 5 MG tablet Take 1 tablet by mouth daily. 12/29/20   [provider]  meloxicam (MOBIC) 7.5 MG tablet Take 1 tablet by mouth as needed. 09/26/20   [provider]  Methylsulfonylmethane 1000 MG CAPS Take 2,000 mg by mouth daily.    [provider]  Misc Natural Products (TART CHERRY ADVANCED PO) Take 2 capsules by mouth daily.    [provider]  SYSTANE ULTRA 0.4-0.3 % SOLN Apply 1 drop to eye 3 (three) times daily. 05/31/19   [provider]  traMADol (ULTRAM) 50 MG tablet Take 50 mg by mouth every 6 (six) hours as needed. 06/26/19   [provider]  TURMERIC PO Take 2 capsules by mouth daily.    [provider]  valsartan (DIOVAN) 320 MG tablet Take 1 tablet (320 mg total) by mouth daily. 01/21/21   Lorretta Harp, MD  White Petrolatum-Mineral Oil (SYSTANE  NIGHTTIME) OINT SMARTSIG:1 Inch(es) In Eye(s) Every Night 05/31/19   [provider]      Allergies    Patient has no  known allergies.    Review of Systems   Review of Systems  Constitutional:  Negative for chills and fever.  HENT:  Negative for facial swelling and trouble swallowing.   Eyes:  Negative for photophobia and visual disturbance.  Respiratory:  Negative for cough and shortness of breath.   Cardiovascular:  Negative for chest pain and palpitations.  Gastrointestinal:  Negative for abdominal pain, nausea and vomiting.  Endocrine: Negative for polydipsia and polyuria.  Genitourinary:  Negative for difficulty urinating and hematuria.  Musculoskeletal:  Negative for gait problem and joint swelling.  Skin:  Negative for pallor and rash.  Neurological:  Positive for headaches. Negative for syncope.  Psychiatric/Behavioral:  Negative for agitation and confusion. The patient is nervous/anxious.    Physical Exam Updated Vital Signs BP (!) 144/59    Pulse (!) 57    Temp 98.5 F (36.9 C) (Oral)    Resp 13    Ht 5\' 9"  (1.753 m)    Wt 92.5 kg    SpO2 97%    BMI 30.13 kg/m  Physical Exam Vitals and nursing note reviewed.  Constitutional:      General: He is not in acute distress.    Appearance: Normal appearance. He is well-developed. He is obese. He is not ill-appearing.  HENT:     Head: Normocephalic and atraumatic.     Right Ear: External ear normal.     Left Ear: External ear normal.     Mouth/Throat:     Mouth: Mucous membranes are moist.  Eyes:     General: No scleral icterus.    Extraocular Movements: Extraocular movements intact.     Pupils: Pupils are equal, round, and reactive to light.  Cardiovascular:     Rate and Rhythm: Normal rate and regular rhythm.     Pulses: Normal pulses.     Heart sounds: Normal heart sounds.  Pulmonary:     Effort: Pulmonary effort is normal. No respiratory distress.     Breath sounds: Normal breath sounds.  Abdominal:      General: Abdomen is flat.     Palpations: Abdomen is soft.     Tenderness: There is no abdominal tenderness.  Musculoskeletal:        General: Normal range of motion.     Cervical back: Normal range of motion.     Right lower leg: No edema.     Left lower leg: No edema.  Skin:    General: Skin is warm and dry.     Capillary Refill: Capillary refill takes less than 2 seconds.  Neurological:     General: No focal deficit present.     Mental Status: He is alert and oriented to person, place, and time.     GCS: GCS eye subscore is 4. GCS verbal subscore is 5. GCS motor subscore is 6.     Cranial Nerves: Cranial nerves 2-12 are intact.     Sensory: Sensation is intact.     Motor: Motor function is intact.     Coordination: Coordination is intact.     Gait: Gait is intact.  Psychiatric:        Mood and Affect: Mood normal.        Behavior: Behavior normal.    ED Results / Procedures / Treatments   Labs (all labs ordered are listed, but only abnormal results are displayed) Labs Reviewed  CBC - Abnormal; Notable for the following components:      Result Value   Platelets  123 (*)    All other components within normal limits  BASIC METABOLIC PANEL - Abnormal; Notable for the following components:   Potassium 3.4 (*)    Glucose, Bld 118 (*)    All other components within normal limits  MAGNESIUM    EKG EKG Interpretation  Date/Time:  Sunday February 16 2021 07:50:12 EST Ventricular Rate:  60 PR Interval:  248 QRS Duration: 129 QT Interval:  431 QTC Calculation: 431 R Axis:   -49 Text Interpretation: Sinus rhythm Prolonged PR interval Baseline wander similar to prior no stemi Confirmed by Wynona Dove ([id number]) on 02/16/2021 8:04:58 AM  Radiology No results found.  Procedures Procedures    Medications Ordered in ED Medications  hydrALAZINE (APRESOLINE) tablet 100 mg (100 mg Oral Given 02/16/21 0828)  sodium chloride 0.9 % bolus 1,000 mL (0 mLs Intravenous Stopped 02/16/21  0940)  metoCLOPramide (REGLAN) injection 5 mg (5 mg Intravenous Given 02/16/21 0829)  diphenhydrAMINE (BENADRYL) injection 25 mg (25 mg Intravenous Given 02/16/21 0830)  potassium chloride SA (KLOR-CON M) CR tablet 40 mEq (40 mEq Oral Given 02/16/21 [id number])    ED Course/ Medical Decision Making/ A&P                           Medical Decision Making Amount and/or Complexity of Data Reviewed Labs: ordered.  Risk OTC drugs. Prescription drug management.    CC: Headache, anxiousness, elevated blood pressure  This patient presents to the Emergency Department for the above complaint. This involves an extensive number of treatment options and is a complaint that carries with it a high risk of complications and morbidity. Vital signs were reviewed. Serious etiologies considered.  Record review:  Previous records obtained and reviewed   Additional history obtained from spouse  Medical and surgical history as noted above.   Work up as above, notable for:  Lab results that were available during my care of the patient were reviewed by me and considered in my medical decision making.   Cardiac monitoring reviewed and interpreted personally which shows normal sinus rhythm  Social determinants of health include -daily alcohol use.  Advised patient to cut back on his alcohol consumption.  No history of alcohol withdrawal previously.  We will add multivitamin  Management: IV fluids, headache cocktail, hydralazine  Reassessment:  Patient reports symptoms have resolved.  Headache resolved.  Blood pressure has improved following oral hydralazine.  Potassium mildly depleted, replaced orally.  Labs also show mild thrombocytopenia consistent with chronic alcohol abuse.  Patient has no chest pain, dyspnea, palpitations.  He is tolerating p.o.  Advised patient follow-up with PCP and cardiology regarding chronic blood pressure medication management.  Advised him to cut back on his alcohol use.   Discussed dietary changes.  Start patient on multivitamin with folate.  The patient improved significantly and was discharged in stable condition. Detailed discussions were had with the patient regarding current findings, and need for close f/u with PCP or on call doctor. The patient has been instructed to return immediately if the symptoms worsen in any way for re-evaluation. Patient verbalized understanding and is in agreement with current care plan. All questions answered prior to discharge.           This chart was dictated using voice recognition software.  Despite best efforts to proofread,  errors can occur which can change the documentation meaning.         Final Clinical Impression(s) / ED Diagnoses Final diagnoses:  Elevated blood pressure reading  Alcohol abuse  Anxiousness    Rx / DC Orders ED Discharge Orders          Ordered    multivitamin-iron-minerals-folic acid (CENTRUM) chewable tablet  Daily        02/16/21 0822    hydrOXYzine (ATARAX) 25 MG tablet  Every 6 hours PRN        02/16/21 0822              Jeanell Sparrow, DO 02/16/21 [phone]

## 2021-02-16 NOTE — ED Notes (Signed)
Patient denies chest pain or SOB.

## 2021-02-16 NOTE — ED Triage Notes (Signed)
States BP has been treading high for last week.  Associated with headache in temples.

## 2021-02-17 ENCOUNTER — Telehealth: Payer: Self-pay

## 2021-02-17 MED ORDER — AMLODIPINE BESYLATE 5 MG PO TABS: 5.0000 mg | ORAL_TABLET | Freq: Every day | ORAL | 6 refills | Status: DC

## 2021-02-17 NOTE — Telephone Encounter (Signed)
Patient went to ED yesterday again for BP at 203/88.  Was given IV fluids and hydralazine 100 mg po.    Reviewed information with him.  He is caught between believing one of the medications is causing him to have a "foggy brain" and worries about BP spikes.  States no stress in his life, eats very low sodium diet, exercises daily.    Will have him add amlodipine 5 mg daily and increase hydralazine to 50 mg tid.  He is scheduled to come in next Tuesday for OV.

## 2021-02-17 NOTE — Telephone Encounter (Signed)
Pt called requesting to speak w/kristin rph regarding recent trip to er w/htn concerns and he requests asap callback. Will route to her.

## 2021-02-18 ENCOUNTER — Ambulatory Visit: Payer: Medicare Other | Admitting: Physical Therapy

## 2021-02-18 ENCOUNTER — Encounter: Payer: Self-pay | Admitting: Physical Therapy

## 2021-02-18 ENCOUNTER — Other Ambulatory Visit: Payer: Self-pay

## 2021-02-18 VITALS — BP 154/73 | HR 74

## 2021-02-18 DIAGNOSIS — H8112 Benign paroxysmal vertigo, left ear: Secondary | ICD-10-CM

## 2021-02-18 DIAGNOSIS — R42 Dizziness and giddiness: Secondary | ICD-10-CM

## 2021-02-18 DIAGNOSIS — R2681 Unsteadiness on feet: Secondary | ICD-10-CM

## 2021-02-18 NOTE — Therapy (Signed)
[facility] [address] [place], [place], [address] Phone: [phone]   Fax:  [phone]  Physical Therapy Treatment  Patient Details  Name: Kevin [name] MRN: [id number] Date of Birth: 1945-07-31 Referring Provider (PT): Raylene Miyamoto, MD   Encounter Date: 02/18/2021   PT End of Session - 02/18/21 1401     Visit Number 6    Number of Visits 17    Date for PT Re-Evaluation 03/20/21    Authorization Type Medicare    PT Start Time 1312    PT Stop Time 1400    PT Time Calculation (min) 48 min    Equipment Utilized During Treatment Gait belt    Activity Tolerance Patient tolerated treatment well;Patient limited by pain    Behavior During Therapy [facility] for tasks assessed/performed             Past Medical History:  Diagnosis Date   Arthritis    osteoarthritis-hips/ shoulders- no issues at present   Complication of anesthesia    longer to wake up   Foley catheter in place 05/30/2013   PT HAD SURG 05/30/13 - CYSTO AND CLOT EVACUATION BLADDER / PROSTATE - UNABLE TO VOID AFTER DISCHARGE - CAME BACK TO ER SAME NIGHT AND HAD FOLEY INSERTED TO DRAINAGE BAG - URINE WAS BLOODY BUT PT REPORTS ON 06/07/13 THAT URINE IN BAG MOSTLY CLEAR NOW.   GERD (gastroesophageal reflux disease)    History of kidney stones    x1   Hypertension    Muscle tension dysphonia     Past Surgical History:  Procedure Laterality Date   BACK SURGERY     lumbar    CYSTOSCOPY W/ URETEROSCOPY W/ LITHOTRIPSY     CYSTOSCOPY/RETROGRADE/URETEROSCOPY/STONE EXTRACTION WITH BASKET N/A 05/30/2013   Procedure: CYSTOSCOPY , BLADDER BIOPSY AND CLOT EVACUATION , CAUTERIZATIO OF LATERAL RIGHT AND LEFT LOBE OF PROSTATE;  Surgeon: Ailene Rud, MD;  Location: [facility];  Service: Urology;  Laterality: N/A;   HERNIA REPAIR     inguinal   INSERTION OF MESH N/A 11/24/2017   Procedure: INSERTION OF MESH;  Surgeon: Donnie Mesa, MD;  Location: [facility];  Service: General;   Laterality: N/A;   JOINT REPLACEMENT     '00-left/'08 RTHA   KNEE ARTHROSCOPY     left knee meniscus surgery   PROSTATE SURGERY Bilateral    7- 8 yrs ago   SHOULDER ARTHROSCOPY Right    SHOULDER ARTHROTOMY Left    TRANSURETHRAL RESECTION OF PROSTATE N/A 06/09/2013   Procedure: TRANSURETHRAL RESECTION OF THE PROSTATE (TURP) WITH GYRUS;  Surgeon: Ailene Rud, MD;  Location: [facility];  Service: Urology;  Laterality: N/A;   UMBILICAL HERNIA REPAIR N/A 11/24/2017   Procedure: UMBILICAL HERNIA REPAIR WITH MESH;  Surgeon: Donnie Mesa, MD;  Location: [facility];  Service: General;  Laterality: N/A;    Vitals:   02/18/21 1315 02/18/21 1320  BP: (!) 164/70 (!) 154/73  Pulse: 72 74  SpO2: 93%      Subjective Assessment - 02/18/21 1313     Subjective Had some changes to his medications d/t trouble with elevated BP recently. New meds are making him feel "off center" and "a little brain fog."    Pertinent History GERD, HTN, muscle tension dysphonia, back surgery, R THA, L knee scope, R shoulder scope, prostate resection 2015    Diagnostic tests 12/02/20 head CT: No acute intracranial pathology. Chronic microvascular disease and cerebral atrophy.  12/02/20 brain MRI:  No acute intracranial pathology. Mild chronic white matter microangiopathy and global parenchymal volume loss. Trace fluid in the left mastoid air cells    Patient Stated Goals decrease dizziness    Currently in Pain? No/denies                               [facility] Adult PT Treatment/Exercise - 02/18/21 0001       Neuro Re-ed    Neuro Re-ed Details  R/L forearm prop sitting o dynadisc + EC- discontinued d/t LBP; turning to set up feet properly, then stepping back over cone and bending to pick back up with CGA; fwd/back stepping + head turns/nods to targets 10x each with CGA   c/o moderate dizziness            Vestibular Treatment/Exercise - 02/18/21 0001       Vestibular Treatment/Exercise   Gaze  Exercises --   marching + horizontal and vertical VOR 30" c/o mild dizziness and trouble coordinating movement     X1 Viewing Horizontal   Foot Position romberg on foam    Reps --   2x30"   Comments slow speed; no dizziness      X1 Viewing Vertical   Foot Position romberg on foam    Reps --   2x30"   Comments slow speed; no dizziness                Balance Exercises - 02/18/21 0001       Balance Exercises: Standing   Gait with Head Turns Forward;2 reps   2x66ft; mild instability   Other Standing Exercises gait + tossing ball 2x7ft                PT Education - 02/18/21 1401     Education Details update/consolidation of HEP-Access Code: [email or web address]    Person(s) Educated Patient    Methods Explanation;Demonstration;Tactile cues;Verbal cues;Handout    Comprehension Returned demonstration;Verbalized understanding              PT Short Term Goals - 01/28/21 1708       PT SHORT TERM GOAL #1   Title Patient to be independent with initial HEP.    Time 3    Period Weeks    Status Achieved    Target Date 02/13/21               PT Long Term Goals - 01/28/21 1708       PT LONG TERM GOAL #1   Title Patient to be independent with advanced HEP.    Time 8    Period Weeks    Status On-going    Target Date 03/20/21      PT LONG TERM GOAL #2   Title Patient to report 0/10 dizziness with standing vertical and horizontal head movements at variable speeds.    Time 8    Period Weeks    Status On-going    Target Date 03/20/21      PT LONG TERM GOAL #3   Title Patient will report 0/10 dizziness with bed mobility.    Time 8    Period Weeks    Status On-going    Target Date 03/20/21      PT LONG TERM GOAL #4   Title Patient to score at least 20/24 on DGI in order to decrease risk of falls.    Time 8    Period Weeks  Status Achieved    Target Date 03/20/21      PT LONG TERM GOAL #5   Title Patient to report 70% improvement in dizziness.    Time 8     Period Weeks    Status On-going    Target Date 03/20/21                   Plan - 02/18/21 1401     Clinical Impression Statement Patient arrived to session with report of feeling off center and  a little brain fog since recent changes to his medications for elevated BP s/p ED visit on 02/16/21 for HTN. Vitals at start of session with elevated systolic BP, otherwise normal and patient otherwise asymptomatic. Worked on progressing VOR and habituation activities today. More LOB to the R side today, particularly with horizontal VOR.  Initiated VOR with addition of marching; patient with c/o mild dizziness and trouble coordinating movement. Habituation was limited today d/t LBP. Patient with c/o mild-moderate dizziness with higher level balance and gaze stabilization activities today. Patient is tolerating sessions well and continues to be consistent with HEP. No complaints at end of session.    Personal Factors and Comorbidities Age;Comorbidity 3+;Time since onset of injury/illness/exacerbation;Past/Current Experience    Comorbidities GERD, HTN, muscle tension dysphonia, back surgery, R THA, L knee scope, R shoulder scope, prostate resection 2015    Examination-Activity Limitations Bathing;Locomotion Level;Transfers;Bed Mobility;Reach Overhead;Bend;Sit;Carry;Sleep;Squat;Dressing;Stairs;Hygiene/Grooming;Stand;Lift;Toileting    Examination-Participation Restrictions Yard Work;Laundry;Driving;Community Activity;Cleaning;Church;Meal Prep    Stability/Clinical Decision Making Evolving/Moderate complexity    Rehab Potential Good    PT Frequency 2x / week    PT Duration 8 weeks    PT Treatment/Interventions ADLs/Self Care Home Management;Canalith Repostioning;Cryotherapy;DME Instruction;Moist Heat;Gait training;Stair training;Functional mobility training;Therapeutic activities;Therapeutic exercise;Balance training;Neuromuscular re-education;Manual techniques;Patient/family education;Passive  range of motion;Dry needling;Vestibular;Taping    PT Next Visit Plan progress VOR training, habituation, optokinetics    Consulted and Agree with Plan of Care Patient             Patient will benefit from skilled therapeutic intervention in order to improve the following deficits and impairments:  Dizziness, Decreased activity tolerance, Decreased balance  Visit Diagnosis: Dizziness and giddiness  Unsteadiness on feet  BPPV (benign paroxysmal positional vertigo), left     Problem List Patient Active Problem List   Diagnosis Date Noted   Arthritis 12/18/2020   Palpitations 04/27/2017   Moderate aortic insufficiency 04/27/2017   Blurred vision, bilateral 05/31/2014   Essential hypertension 01/25/2014   Benign prostatic hypertrophy 06/09/2013    Janene Harvey, PT, DPT 02/18/21 2:41 PM   [facility] [address] [place], [place], [address] Phone: [phone]   Fax:  [phone]  Name: Kevin [name] MRN: [id number] Date of Birth: 1945/08/11

## 2021-02-20 ENCOUNTER — Ambulatory Visit: Payer: Medicare Other | Admitting: Rehabilitative and Restorative Service Providers"

## 2021-02-25 ENCOUNTER — Ambulatory Visit (INDEPENDENT_AMBULATORY_CARE_PROVIDER_SITE_OTHER): Payer: Medicare Other | Admitting: Pharmacist Clinician (PhC)/ Clinical Pharmacy Specialist

## 2021-02-25 ENCOUNTER — Encounter: Payer: Self-pay | Admitting: Pharmacist Clinician (PhC)/ Clinical Pharmacy Specialist

## 2021-02-25 ENCOUNTER — Ambulatory Visit: Payer: Medicare Other | Attending: Otolaryngology | Admitting: Physical Therapy

## 2021-02-25 ENCOUNTER — Ambulatory Visit: Payer: Medicare Other | Admitting: Physical Therapy

## 2021-02-25 ENCOUNTER — Encounter: Payer: Self-pay | Admitting: Physical Therapy

## 2021-02-25 ENCOUNTER — Other Ambulatory Visit: Payer: Self-pay

## 2021-02-25 VITALS — BP 130/68

## 2021-02-25 DIAGNOSIS — H8112 Benign paroxysmal vertigo, left ear: Secondary | ICD-10-CM | POA: Diagnosis present

## 2021-02-25 DIAGNOSIS — I10 Essential (primary) hypertension: Secondary | ICD-10-CM | POA: Diagnosis not present

## 2021-02-25 DIAGNOSIS — R2681 Unsteadiness on feet: Secondary | ICD-10-CM | POA: Diagnosis present

## 2021-02-25 DIAGNOSIS — R42 Dizziness and giddiness: Secondary | ICD-10-CM | POA: Diagnosis not present

## 2021-02-25 NOTE — Patient Instructions (Signed)
Please reach out to Dr. Docia Chuck to discuss options to help with anxiety.  Once you have a new mediation, reach out to me and we will set up appointment for follow up to check blood pressure.    Consider escitalopram (Lexapro) or sertraline (Zoloft) as options.    Do NOT check your blood pressure at home until I tell you differently.    Take your BP meds as follows:  Continue with your current medications  Bring all of your meds, your BP cuff and your record of home blood pressures to your next appointment.  Exercise as youre able, try to walk approximately 30 minutes per day.  Keep salt intake to a minimum, especially watch canned and prepared boxed foods.  Eat more fresh fruits and vegetables and fewer canned items.  Avoid eating in fast food restaurants.    HOW TO TAKE YOUR BLOOD PRESSURE: Rest 5 minutes before taking your blood pressure.  Dont smoke or drink caffeinated beverages for at least 30 minutes before. Take your blood pressure before (not after) you eat. Sit comfortably with your back supported and both feet on the floor (dont cross your legs). Elevate your arm to heart level on a table or a desk. Use the proper sized cuff. It should fit smoothly and snugly around your bare upper arm. There should be enough room to slip a fingertip under the cuff. The bottom edge of the cuff should be 1 inch above the crease of the elbow. Ideally, take 3 measurements at one sitting and record the average.

## 2021-02-25 NOTE — Therapy (Signed)
[facility] [address], [place], [place], [address] Phone: [phone]   Fax:  [phone]  Physical Therapy Treatment  Patient Details  Name: Kevin [name] MRN: [id number] Date of Birth: 1945-09-05 Referring Provider (PT): Raylene Miyamoto, MD   Encounter Date: 02/25/2021   PT End of Session - 02/25/21 1444     Visit Number 7    Number of Visits 17    Date for PT Re-Evaluation 03/20/21    Authorization Type Medicare    PT Start Time 1402    PT Stop Time 1442    PT Time Calculation (min) 40 min    Equipment Utilized During Treatment Gait belt    Activity Tolerance Patient tolerated treatment well    Behavior During Therapy [facility] for tasks assessed/performed             Past Medical History:  Diagnosis Date   Arthritis    osteoarthritis-hips/ shoulders- no issues at present   Complication of anesthesia    longer to wake up   Foley catheter in place 05/30/2013   PT HAD SURG 05/30/13 - CYSTO AND CLOT EVACUATION BLADDER / PROSTATE - UNABLE TO VOID AFTER DISCHARGE - CAME BACK TO ER SAME NIGHT AND HAD FOLEY INSERTED TO DRAINAGE BAG - URINE WAS BLOODY BUT PT REPORTS ON 06/07/13 THAT URINE IN BAG MOSTLY CLEAR NOW.   GERD (gastroesophageal reflux disease)    History of kidney stones    x1   Hypertension    Muscle tension dysphonia     Past Surgical History:  Procedure Laterality Date   BACK SURGERY     lumbar    CYSTOSCOPY W/ URETEROSCOPY W/ LITHOTRIPSY     CYSTOSCOPY/RETROGRADE/URETEROSCOPY/STONE EXTRACTION WITH BASKET N/A 05/30/2013   Procedure: CYSTOSCOPY , BLADDER BIOPSY AND CLOT EVACUATION , CAUTERIZATIO OF LATERAL RIGHT AND LEFT LOBE OF PROSTATE;  Surgeon: Ailene Rud, MD;  Location: [facility];  Service: Urology;  Laterality: N/A;   HERNIA REPAIR     inguinal   INSERTION OF MESH N/A 11/24/2017   Procedure: INSERTION OF MESH;  Surgeon: Donnie Mesa, MD;  Location: [place];  Service: General;  Laterality: N/A;    JOINT REPLACEMENT     '00-left/'08 RTHA   KNEE ARTHROSCOPY     left knee meniscus surgery   PROSTATE SURGERY Bilateral    7- 8 yrs ago   SHOULDER ARTHROSCOPY Right    SHOULDER ARTHROTOMY Left    TRANSURETHRAL RESECTION OF PROSTATE N/A 06/09/2013   Procedure: TRANSURETHRAL RESECTION OF THE PROSTATE (TURP) WITH GYRUS;  Surgeon: Ailene Rud, MD;  Location: [facility];  Service: Urology;  Laterality: N/A;   UMBILICAL HERNIA REPAIR N/A 11/24/2017   Procedure: UMBILICAL HERNIA REPAIR WITH MESH;  Surgeon: Donnie Mesa, MD;  Location: [place];  Service: General;  Laterality: N/A;    Vitals:   02/25/21 1404  BP: 130/68     Subjective Assessment - 02/25/21 1401     Subjective Still having BP problems. Saw Cardiology today and denies medication changes. Dizziness continues to improve. Balance pad is tough. Still not performing Nestor Lewandowsky to avoid bothering the back.    Pertinent History GERD, HTN, muscle tension dysphonia, back surgery, R THA, L knee scope, R shoulder scope, prostate resection 2015    Diagnostic tests 12/02/20 head CT: No acute intracranial pathology. Chronic microvascular disease and cerebral atrophy.  12/02/20 brain MRI: No acute intracranial pathology. Mild chronic white matter microangiopathy and global parenchymal  volume loss. Trace fluid in the left mastoid air cells    Patient Stated Goals decrease dizziness    Currently in Pain? No/denies                               [facility] Adult PT Treatment/Exercise - 02/25/21 0001       Neuro Re-ed    Neuro Re-ed Details  quick 1/2 turns to targets- 0/10 dizziness; gait + vertical/horizontal VOR 252ft, R/L lateral step over cone, bending to pikc back up 10x each; R/L fwd/back stepping with 1 foot on foam 10x each             Vestibular Treatment/Exercise - 02/25/21 0001       Standing Horizontal Head Turns   Number of Reps  --   30"   Symptom Description  EC; mild instability      Standing  Vertical Head Turns   Number of Reps  --   30'   Symptom Description  EC; mild instability      X1 Viewing Horizontal   Foot Position wide, romberg on foam    Reps --   30"   Comments 1/10 dizziness both versions, moderate sway in romberg      X1 Viewing Vertical   Foot Position wide, romberg on foam    Reps --   30"   Comments 1/10 dizziness both versions, moderate sway in romberg                Balance Exercises - 02/25/21 0001       Balance Exercises: Standing   Standing Eyes Opened Narrow base of support (BOS);Foam/compliant surface;1 rep;30 secs    Standing Eyes Closed Wide (BOA);Narrow base of support (BOS);2 reps;30 secs                PT Education - 02/25/21 1443     Education Details update/consolidation of HEP-Access Code: [email or web address]    Person(s) Educated Patient    Methods Explanation;Demonstration;Tactile cues;Verbal cues;Handout    Comprehension Verbalized understanding;Returned demonstration              PT Short Term Goals - 01/28/21 1708       PT SHORT TERM GOAL #1   Title Patient to be independent with initial HEP.    Time 3    Period Weeks    Status Achieved    Target Date 02/13/21               PT Long Term Goals - 01/28/21 1708       PT LONG TERM GOAL #1   Title Patient to be independent with advanced HEP.    Time 8    Period Weeks    Status On-going    Target Date 03/20/21      PT LONG TERM GOAL #2   Title Patient to report 0/10 dizziness with standing vertical and horizontal head movements at variable speeds.    Time 8    Period Weeks    Status On-going    Target Date 03/20/21      PT LONG TERM GOAL #3   Title Patient will report 0/10 dizziness with bed mobility.    Time 8    Period Weeks    Status On-going    Target Date 03/20/21      PT LONG TERM GOAL #4   Title Patient to score at least 20/24 on DGI in order  to decrease risk of falls.    Time 8    Period Weeks    Status Achieved    Target Date  03/20/21      PT LONG TERM GOAL #5   Title Patient to report 70% improvement in dizziness.    Time 8    Period Weeks    Status On-going    Target Date 03/20/21                   Plan - 02/25/21 1444     Clinical Impression Statement Patient arrived to session with report of continued problems with BP. Note that he continues to avoid performing Nestor Lewandowsky to avoid bothering his back. BP at start of session was elevated for systolic BP. Reviewed HEP and consolidated exercises as patient is having only very slight remaining dizziness with exercises and now only mild-moderate imbalance remaining with most challenging activities. Patient reported understanding of HEP review/update and without complaints at end of session.    Personal Factors and Comorbidities Age;Comorbidity 3+;Time since onset of injury/illness/exacerbation;Past/Current Experience    Comorbidities GERD, HTN, muscle tension dysphonia, back surgery, R THA, L knee scope, R shoulder scope, prostate resection 2015    Examination-Activity Limitations Bathing;Locomotion Level;Transfers;Bed Mobility;Reach Overhead;Bend;Sit;Carry;Sleep;Squat;Dressing;Stairs;Hygiene/Grooming;Stand;Lift;Toileting    Examination-Participation Restrictions Yard Work;Laundry;Driving;Community Activity;Cleaning;Church;Meal Prep    Stability/Clinical Decision Making Evolving/Moderate complexity    Rehab Potential Good    PT Frequency 2x / week    PT Duration 8 weeks    PT Treatment/Interventions ADLs/Self Care Home Management;Canalith Repostioning;Cryotherapy;DME Instruction;Moist Heat;Gait training;Stair training;Functional mobility training;Therapeutic activities;Therapeutic exercise;Balance training;Neuromuscular re-education;Manual techniques;Patient/family education;Passive range of motion;Dry needling;Vestibular;Taping    PT Next Visit Plan progress VOR training, habituation, optokinetics    Consulted and Agree with Plan of Care Patient              Patient will benefit from skilled therapeutic intervention in order to improve the following deficits and impairments:  Dizziness, Decreased activity tolerance, Decreased balance  Visit Diagnosis: Dizziness and giddiness  Unsteadiness on feet  BPPV (benign paroxysmal positional vertigo), left     Problem List Patient Active Problem List   Diagnosis Date Noted   Arthritis 12/18/2020   Palpitations 04/27/2017   Moderate aortic insufficiency 04/27/2017   Blurred vision, bilateral 05/31/2014   Essential hypertension 01/25/2014   Benign prostatic hypertrophy 06/09/2013    Janene Harvey, PT, DPT 02/25/21 2:45 PM   [facility] [address] [place], [place], [address] Phone: [phone]   Fax:  [phone]  Name: ADALBERT DUMITRESCU MRN: [id number] Date of Birth: [birth year]

## 2021-02-25 NOTE — Progress Notes (Signed)
02/28/2021 Kevin [name] 09/17/1945 [id number]   HPI:  Kevin [name] is a 76 y.o. male patient of Dr Allyson Sabal, with a PMH below who presents today for hypertension clinic follow up.  See previous notes for changes that have been made over the past years.  Today he comes in one month after previous visit.  In that time he has again gone to ED because of elevated home BP readings.  He records readings each morning consistently, but finds he spends most of the day worrying about the results.    We had a long discussion about his almost obsession with blood pressure readings.  His wife is with him this morning.  Patient reports that he wakes up every morning wondering if this is the day his elevated BP readings will cause him to have a stroke.  He will then take his BP and his mood for the day is based on the reading he gets.  His wife agrees with this information, stating that if his pressure is elevated, he is anxious and stressed the remainder of the day.  He agrees that it is consuming much of his thought process, and endorses that he has recently had what he can only describe as a panic attack.  Last November he had asked to come off BP medications because of "brain fog", and instead treat with natural products (beet juice).  This did not work, causing his BP to become more elevated, which then led to further anxieties and stress.  Getting him back on his previous routine did not stop these feelings.  His most recent medication change was to add amlodipine 5 mg, just 1 week ago.    Blood Pressure Goal:  130/80  Current Medications:  valsartan 320 mg qhs chlorthalidone 25 mg qhs, hydralazine 50 mg tid, amlodipine 5 mg qam  Family Hx:  father had hypertension for many years, died after MI at age 38.  No cardiac history in mother.  One sister with hypertension.  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  Drinks 2-3 glasses of wine most evenings  Diet: mostly  home cooked from scratch, no added salt  Exercise: limited due to vertigo (goes to PT for this)  Home BP readings:  has readings with him today, still elevated 139-165 systolic - many of the elevated readings probably due in part to anxiety  Intolerances: nkda  Labs: 12/02/20:  Na 140, K 3.4, Glu 118, BUN 20, SCr 0.94 GFR >60   Wt Readings from Last 3 Encounters:  02/25/21 201 lb (91.2 kg)  02/16/21 204 lb (92.5 kg)  01/21/21 208 lb 6.4 oz (94.5 kg)   BP Readings from Last 3 Encounters:  02/25/21 130/68  02/25/21 (!) 162/76  02/18/21 (!) 154/73   Pulse Readings from Last 3 Encounters:  02/25/21 71  02/18/21 74  02/16/21 66    Current Outpatient Medications  Medication Sig Dispense Refill   amLODipine (NORVASC) 5 MG tablet Take 1 tablet (5 mg total) by mouth daily. 30 tablet 6   amoxicillin (AMOXIL) 500 MG capsule Before dental procedures     budesonide (PULMICORT) 180 MCG/ACT inhaler Inhale 1 puff into the lungs as needed.      chlorthalidone (HYGROTON) 25 MG tablet Take 1 tablet (25 mg total) by mouth daily. **Keep upcoming appointment for future refills** 90 tablet 0   hydrALAZINE (APRESOLINE) 50 MG tablet Take 1 tablet (50 mg total) by mouth in the  morning and at bedtime. (Patient taking differently: Take 50 mg by mouth 3 (three) times daily.) 60 tablet 6   ipratropium (ATROVENT) 0.06 % nasal spray Place 1 spray into the nose as needed.     ketoconazole (NIZORAL) 2 % shampoo Apply 1 application topically as needed.     latanoprost (XALATAN) 0.005 % ophthalmic solution Place 1 drop into both eyes at bedtime.     meloxicam (MOBIC) 7.5 MG tablet Take 1 tablet by mouth as needed.     Methylsulfonylmethane 1000 MG CAPS Take 2,000 mg by mouth daily.     Misc Natural Products (TART CHERRY ADVANCED PO) Take 2 capsules by mouth daily.     mupirocin ointment (BACTROBAN) 2 % Apply 1 application topically 2 (two) times daily.     omeprazole (PRILOSEC) 40 MG capsule Take 40 mg by mouth  daily.     SYSTANE ULTRA 0.4-0.3 % SOLN Apply 1 drop to eye 3 (three) times daily.     traMADol (ULTRAM) 50 MG tablet Take 50 mg by mouth every 6 (six) hours as needed.     TURMERIC PO Take 2 capsules by mouth daily.     valsartan (DIOVAN) 320 MG tablet Take 1 tablet (320 mg total) by mouth daily. 30 tablet 6   White Petrolatum-Mineral Oil (SYSTANE NIGHTTIME) OINT SMARTSIG:1 Inch(es) In Eye(s) Every Night     hydrOXYzine (ATARAX) 25 MG tablet Take 1 tablet (25 mg total) by mouth every 6 (six) hours as needed for anxiety. (Patient not taking: Reported on 02/25/2021) 10 tablet 0   levocetirizine (XYZAL) 5 MG tablet Take 1 tablet by mouth daily. (Patient not taking: Reported on 02/25/2021)     multivitamin-iron-minerals-folic acid (CENTRUM) chewable tablet Chew 1 tablet by mouth daily. (Patient not taking: Reported on 02/25/2021) 30 tablet 0   No current facility-administered medications for this visit.    No Known Allergies  Past Medical History:  Diagnosis Date   Arthritis    osteoarthritis-hips/ shoulders- no issues at present   Complication of anesthesia    longer to wake up   Foley catheter in place 05/30/2013   PT HAD SURG 05/30/13 - CYSTO AND CLOT EVACUATION BLADDER / PROSTATE - UNABLE TO VOID AFTER DISCHARGE - CAME BACK TO ER SAME NIGHT AND HAD FOLEY INSERTED TO DRAINAGE BAG - URINE WAS BLOODY BUT PT REPORTS ON 06/07/13 THAT URINE IN BAG MOSTLY CLEAR NOW.   GERD (gastroesophageal reflux disease)    History of kidney stones    x1   Hypertension    Muscle tension dysphonia     Blood pressure (!) 162/76, pulse 71, resp. rate 16, height 5\' 9"  (1.753 m), weight 201 lb (91.2 kg), SpO2 98 %.  Standing 130/54 (at standing), after 2 minutes 132/52  Essential hypertension Patient with longstanding hypertension, still not at goal despite compliance with medications.  We had a long discussion in the office, and at this point I think much of his BP elevation is due to anxiety and stress over what  might happen.  I have asked that his wife put the BP cuff away and he no longer check home readings.  He needs to reach out to his PCP for a discussion on depression/anxiety and treat this accordingly.  His medication physician recommended a few medication and we went through that list.  I encouraged him to avoid benzodiazepines and instead look to sertraline or escitalopram and we discussed the pros/cons of these types of medicine.  He realizes that  he needs to deal with this issue and will reach out to his PCP today to schedule an appointment.  I asked that he reach out to me when he starts treatment, and we can determine how long to expect a response and therefor when would be best to follow up on what his true blood pressure readings are.  Patient and wife are both agreeable with this plan.    [name] PharmD CPP [facility] [address] [place], [place] [address] [phone]

## 2021-02-27 ENCOUNTER — Ambulatory Visit: Payer: Medicare Other | Admitting: Physical Therapy

## 2021-02-27 ENCOUNTER — Telehealth: Payer: Self-pay

## 2021-02-27 NOTE — Telephone Encounter (Signed)
Pt called and left [place] a msg stating that kristin alvstad requested he notify her when he restarted zoloft. Routing phone call to her.

## 2021-02-27 NOTE — Telephone Encounter (Signed)
Returned call - patient talked to PCP yesterday and was started on sertraline 50 mg once daily.  Will have him continue with this for 6 weeks before we bring him back for BP monitoring.  Patient agreeable with plan.  Appointment set

## 2021-02-28 ENCOUNTER — Encounter: Payer: Self-pay | Admitting: Pharmacist Clinician (PhC)/ Clinical Pharmacy Specialist

## 2021-02-28 NOTE — Assessment & Plan Note (Signed)
Patient with longstanding hypertension, still not at goal despite compliance with medications.  We had a long discussion in the office, and at this point I think much of his BP elevation is due to anxiety and stress over what might happen.  I have asked that his wife put the BP cuff away and he no longer check home readings.  He needs to reach out to his PCP for a discussion on depression/anxiety and treat this accordingly.  His [place] medication physician recommended a few medication and we went through that list.  I encouraged him to avoid benzodiazepines and instead look to sertraline or escitalopram and we discussed the pros/cons of these types of medicine.  He realizes that he needs to deal with this issue and will reach out to his PCP today to schedule an appointment.  I asked that he reach out to me when he starts treatment, and we can determine how long to expect a response and therefor when would be best to follow up on what his true blood pressure readings are.  Patient and wife are both agreeable with this plan.

## 2021-03-04 ENCOUNTER — Other Ambulatory Visit: Payer: Self-pay

## 2021-03-04 ENCOUNTER — Encounter: Payer: Self-pay | Admitting: Physical Therapy

## 2021-03-04 ENCOUNTER — Ambulatory Visit: Payer: Medicare Other | Admitting: Physical Therapy

## 2021-03-04 VITALS — BP 155/65 | HR 61

## 2021-03-04 DIAGNOSIS — R42 Dizziness and giddiness: Secondary | ICD-10-CM

## 2021-03-04 DIAGNOSIS — R2681 Unsteadiness on feet: Secondary | ICD-10-CM

## 2021-03-04 DIAGNOSIS — H8112 Benign paroxysmal vertigo, left ear: Secondary | ICD-10-CM

## 2021-03-04 NOTE — Therapy (Signed)
[facility] [address] [place], [place], [address] Phone: [phone]   Fax:  [phone]  Physical Therapy Discharge Summary  Patient Details  Name: Kevin [name] MRN: [id number] Date of Birth: September 04, 1945 Referring Provider (PT): Raylene Miyamoto, MD   Progress Note Reporting Period 01/23/21 to 03/04/21  See note below for Objective Data and Assessment of Progress/Goals.    Encounter Date: 03/04/2021   PT End of Session - 03/04/21 1218     Visit Number 8    Number of Visits 17    Date for PT Re-Evaluation 03/20/21    Authorization Type Medicare    PT Start Time 1147    PT Stop Time 1212    PT Time Calculation (min) 25 min    Equipment Utilized During Treatment Gait belt    Activity Tolerance Patient tolerated treatment well    Behavior During Therapy WFL for tasks assessed/performed             Past Medical History:  Diagnosis Date   Arthritis    osteoarthritis-hips/ shoulders- no issues at present   Complication of anesthesia    longer to wake up   Foley catheter in place 05/30/2013   PT HAD SURG 05/30/13 - CYSTO AND CLOT EVACUATION BLADDER / PROSTATE - UNABLE TO VOID AFTER DISCHARGE - CAME BACK TO ER SAME NIGHT AND HAD FOLEY INSERTED TO DRAINAGE BAG - URINE WAS BLOODY BUT PT REPORTS ON 06/07/13 THAT URINE IN BAG MOSTLY CLEAR NOW.   GERD (gastroesophageal reflux disease)    History of kidney stones    x1   Hypertension    Muscle tension dysphonia     Past Surgical History:  Procedure Laterality Date   BACK SURGERY     lumbar    CYSTOSCOPY W/ URETEROSCOPY W/ LITHOTRIPSY     CYSTOSCOPY/RETROGRADE/URETEROSCOPY/STONE EXTRACTION WITH BASKET N/A 05/30/2013   Procedure: CYSTOSCOPY , BLADDER BIOPSY AND CLOT EVACUATION , CAUTERIZATIO OF LATERAL RIGHT AND LEFT LOBE OF PROSTATE;  Surgeon: Ailene Rud, MD;  Location: [facility];  Service: Urology;  Laterality: N/A;   HERNIA REPAIR     inguinal   INSERTION OF MESH  N/A 11/24/2017   Procedure: INSERTION OF MESH;  Surgeon: Donnie Mesa, MD;  Location: [facility];  Service: General;  Laterality: N/A;   JOINT REPLACEMENT     '00-left/'08 RTHA   KNEE ARTHROSCOPY     left knee meniscus surgery   PROSTATE SURGERY Bilateral    7- 8 yrs ago   SHOULDER ARTHROSCOPY Right    SHOULDER ARTHROTOMY Left    TRANSURETHRAL RESECTION OF PROSTATE N/A 06/09/2013   Procedure: TRANSURETHRAL RESECTION OF THE PROSTATE (TURP) WITH GYRUS;  Surgeon: Ailene Rud, MD;  Location: [facility];  Service: Urology;  Laterality: N/A;   UMBILICAL HERNIA REPAIR N/A 11/24/2017   Procedure: UMBILICAL HERNIA REPAIR WITH MESH;  Surgeon: Donnie Mesa, MD;  Location: [facility];  Service: General;  Laterality: N/A;    Vitals:   03/04/21 1152  BP: (!) 155/65  Pulse: 61  SpO2: 97%     Subjective Assessment - 03/04/21 1148     Subjective Not muhc new. Feeling good. Still feels like recent HEP update is a good challenge. Reports 88.6% improvement in dizziness.    Pertinent History GERD, HTN, muscle tension dysphonia, back surgery, R THA, L knee scope, R shoulder scope, prostate resection 2015    Diagnostic tests 12/02/20 head CT: No acute intracranial pathology. Chronic microvascular  disease and cerebral atrophy.  12/02/20 brain MRI: No acute intracranial pathology. Mild chronic white matter microangiopathy and global parenchymal volume loss. Trace fluid in the left mastoid air cells    Patient Stated Goals decrease dizziness    Currently in Pain? No/denies                [facility] PT Assessment - 03/04/21 0001       Functional Gait  Assessment   Gait assessed  Yes    Gait Level Surface Walks 20 ft in less than 5.5 sec, no assistive devices, good speed, no evidence for imbalance, normal gait pattern, deviates no more than 6 in outside of the 12 in walkway width.    Change in Gait Speed Able to smoothly change walking speed without loss of balance or gait deviation. Deviate no more than 6 in  outside of the 12 in walkway width.    Gait with Horizontal Head Turns Performs head turns smoothly with slight change in gait velocity (eg, minor disruption to smooth gait path), deviates 6-10 in outside 12 in walkway width, or uses an assistive device.    Gait with Vertical Head Turns Performs task with slight change in gait velocity (eg, minor disruption to smooth gait path), deviates 6 - 10 in outside 12 in walkway width or uses assistive device    Gait and Pivot Turn Pivot turns safely within 3 sec and stops quickly with no loss of balance.    Step Over Obstacle Is able to step over 2 stacked shoe boxes taped together (9 in total height) without changing gait speed. No evidence of imbalance.    Gait with Narrow Base of Support Ambulates less than 4 steps heel to toe or cannot perform without assistance.    Gait with Eyes Closed Walks 20 ft, no assistive devices, good speed, no evidence of imbalance, normal gait pattern, deviates no more than 6 in outside 12 in walkway width. Ambulates 20 ft in less than 7 sec.    Ambulating Backwards Walks 20 ft, no assistive devices, good speed, no evidence for imbalance, normal gait    Steps Alternating feet, no rail.    Total Score 25                            Vestibular Treatment/Exercise - 03/04/21 0001       Vestibular Treatment/Exercise   Gaze Exercises --   simulating bed mobility insupine sit<>supine and R/L rolling; reporting 1/10 dizziness upon sitting up from supine     X1 Viewing Horizontal   Foot Position standing    Reps --   30"   Comments 1/10 dizziness      X1 Viewing Vertical   Foot Position standing    Reps --   30"   Comments 1/10 dizziness                    PT Education - 03/04/21 1216     Education Details update to HEP-Access Code: [email or web address], discussion on objective progress and remaining deficits; answered patient's questions    Person(s) Educated Patient    Methods  Explanation;Demonstration;Tactile cues;Verbal cues;Handout    Comprehension Verbalized understanding;Returned demonstration              PT Short Term Goals - 03/04/21 1221       PT SHORT TERM GOAL #1   Title Patient to be independent with initial HEP.  Time 3    Period Weeks    Status Achieved    Target Date 02/13/21               PT Long Term Goals - 03/04/21 1221       PT LONG TERM GOAL #1   Title Patient to be independent with advanced HEP.    Time 8    Period Weeks    Status Achieved    Target Date 03/20/21      PT LONG TERM GOAL #2   Title Patient to report 0/10 dizziness with standing vertical and horizontal head movements at variable speeds.    Time 8    Period Weeks    Status Partially Met   1/10 dizziness   Target Date 03/20/21      PT LONG TERM GOAL #3   Title Patient will report 0/10 dizziness with bed mobility.    Time 8    Period Weeks    Status Partially Met   1/10 dizziness   Target Date 03/20/21      PT LONG TERM GOAL #4   Title Patient to score at least 20/24 on DGI in order to decrease risk of falls.    Time 8    Period Weeks    Status Achieved    Target Date 03/20/21      PT LONG TERM GOAL #5   Title Patient to report 70% improvement in dizziness.    Time 8    Period Weeks    Status Achieved   88.6%   Target Date 03/20/21                   Plan - 03/04/21 1218     Clinical Impression Statement Patient arrived to session with report of 88.6% improvement in dizziness. Systolic BP elevated at start of session but patient asymptomatic. Patient tolerated bed mobility and VOR with 1/10 dizziness remaining. Patient has previously met DGI goal. Assessed FGA, which patient scored 25/30 on, indicating a decreased risk of falls. Reviewed HEP and updated it with tandem walk for continued balance practice. Patient has met or partially met all goals and is ready for D/C with transition to HEP.    Personal Factors and Comorbidities  Age;Comorbidity 3+;Time since onset of injury/illness/exacerbation;Past/Current Experience    Comorbidities GERD, HTN, muscle tension dysphonia, back surgery, R THA, L knee scope, R shoulder scope, prostate resection 2015    Examination-Activity Limitations Bathing;Locomotion Level;Transfers;Bed Mobility;Reach Overhead;Bend;Sit;Carry;Sleep;Squat;Dressing;Stairs;Hygiene/Grooming;Stand;Lift;Toileting    Examination-Participation Restrictions Yard Work;Laundry;Driving;Community Activity;Cleaning;Church;Meal Prep    Stability/Clinical Decision Making Evolving/Moderate complexity    Rehab Potential Good    PT Frequency 2x / week    PT Duration 8 weeks    PT Treatment/Interventions ADLs/Self Care Home Management;Canalith Repostioning;Cryotherapy;DME Instruction;Moist Heat;Gait training;Stair training;Functional mobility training;Therapeutic activities;Therapeutic exercise;Balance training;Neuromuscular re-education;Manual techniques;Patient/family education;Passive range of motion;Dry needling;Vestibular;Taping    PT Next Visit Plan DC at this time    Consulted and Agree with Plan of Care Patient             Patient will benefit from skilled therapeutic intervention in order to improve the following deficits and impairments:  Dizziness, Decreased activity tolerance, Decreased balance  Visit Diagnosis: Dizziness and giddiness  Unsteadiness on feet  BPPV (benign paroxysmal positional vertigo), left     Problem List Patient Active Problem List   Diagnosis Date Noted   Arthritis 12/18/2020   Palpitations 04/27/2017   Moderate aortic insufficiency 04/27/2017   Blurred vision, bilateral 05/31/2014  Essential hypertension 01/25/2014   Benign prostatic hypertrophy 06/09/2013    PHYSICAL THERAPY DISCHARGE SUMMARY  Visits from Start of Care: 8  Current functional level related to goals / functional outcomes: See above clinical impression   Remaining deficits: Mild dizziness with bed  mobility and VOR exercises   Education / Equipment: HEP  Plan: Patient agrees to discharge.  Patient goals were partially met. Patient is being discharged due to meeting the stated rehab goals.      Janene Harvey, PT, DPT 03/04/21 12:23 PM   [facility] [address], [place], [place], [address] Phone: [phone]   Fax:  [phone]  Name: Kevin [name] MRN: [id number] Date of Birth: [birth year]

## 2021-03-06 ENCOUNTER — Ambulatory Visit: Payer: Medicare Other | Admitting: Physical Therapy

## 2021-04-14 ENCOUNTER — Encounter: Payer: Self-pay | Admitting: Pharmacist Clinician (PhC)/ Clinical Pharmacy Specialist

## 2021-04-14 ENCOUNTER — Other Ambulatory Visit: Payer: Self-pay

## 2021-04-14 ENCOUNTER — Ambulatory Visit (INDEPENDENT_AMBULATORY_CARE_PROVIDER_SITE_OTHER): Payer: Medicare Other | Admitting: Pharmacist Clinician (PhC)/ Clinical Pharmacy Specialist

## 2021-04-14 VITALS — BP 166/52 | HR 64 | Resp 15 | Ht 69.0 in | Wt 203.4 lb

## 2021-04-14 DIAGNOSIS — I10 Essential (primary) hypertension: Secondary | ICD-10-CM | POA: Diagnosis not present

## 2021-04-14 MED ORDER — ROSUVASTATIN CALCIUM 5 MG PO TABS: 5.0000 mg | ORAL_TABLET | Freq: Every day | ORAL | 3 refills | Status: DC

## 2021-04-14 MED ORDER — SPIRONOLACTONE 25 MG PO TABS: 25.0000 mg | ORAL_TABLET | Freq: Every day | ORAL | 3 refills | Status: DC

## 2021-04-14 NOTE — Progress Notes (Signed)
? ? ? ?04/15/2021 ?Kevin [name] ?August 19, 1945 ?[id number] ? ? ?HPI:  Kevin ComberRobert E [name] is a 76 y.o. male patient of Dr Allyson SabalBerry, with a PMH below who presents today for hypertension clinic follow up.  See previous notes for changes that have been made over the past years.  Today he comes in one month after previous visit.  In that time he has again gone to ED because of elevated home BP readings.  He records readings each morning consistently, but finds he spends most of the day worrying about the results.   ? ?We had a long discussion about his almost obsession with blood pressure readings.  His wife is with him this morning.  Patient reports that he wakes up every morning wondering if this is the day his elevated BP readings will cause him to have a stroke.  He will then take his BP and his mood for the day is based on the reading he gets.  His wife agrees with this information, stating that if his pressure is elevated, he is anxious and stressed the remainder of the day.  He agrees that it is consuming much of his thought process, and endorses that he has recently had what he can only describe as a panic attack.  Last November he had asked to come off BP medications because of "brain fog", and instead treat with natural products (beet juice).  This did not work, causing his BP to become more elevated, which then led to further anxieties and stress.  Getting him back on his previous routine did not stop these feelings.  His most recent medication change was to add amlodipine 5 mg, just 1 week ago.  After that visit he reached out to his PCP and was started on sertraline 25 mg daily.  He was agreeable to not checking his pressure at home until he had been on this for 2 months and returned for a visit here.   ? ?Today he returns for follow up.  He has now been on sertraline 25 mg for 2 months and notes that after about 2 weeks his "brain fog" was cleared up and he started feeling much better.  He has also completed PT  for inner ear problems.  Last week he went to his PCP and his blood pressure was 136 systolic.   ? ? ?Blood Pressure Goal:  130/80 ? ?Current Medications:  valsartan 320 mg qhs chlorthalidone 25 mg qhs, hydralazine 50 mg tid, amlodipine 5 mg qam ? ?Family Hx:  father had hypertension for many years, died after MI at age 75.  No cardiac history in mother.  One sister with hypertension. ? ?Social Hx:  No tobacco, drinks 2-3 cups of coffee/day, [employer] home brew.  Drinks 2-3 glasses of wine most evenings ? ?Diet: mostly home cooked from scratch, no added salt ? ?Exercise: limited due to vertigo (goes to PT for this) ? ?Home BP readings:  no home readings (he was asked NOT to check), but notes at PCP last week was 136 systolic ? ?Intolerances: nkda ? ?Labs: 12/02/20:  Na 140, K 3.4, Glu 118, BUN 20, SCr 0.94 GFR >60 ? ? ?Wt Readings from Last 3 Encounters:  ?04/14/21 203 lb 6.4 oz (92.3 kg)  ?02/25/21 201 lb (91.2 kg)  ?02/16/21 204 lb (92.5 kg)  ? ?BP Readings from Last 3 Encounters:  ?04/14/21 (!) 166/52  ?03/04/21 (!) 155/65  ?02/25/21 130/68  ? ?Pulse Readings from Last 3 Encounters:  ?04/14/21 64  ?  03/04/21 61  ?02/25/21 71  ? ? ?Current Outpatient Medications  ?Medication Sig Dispense Refill  ? amLODipine (NORVASC) 5 MG tablet Take 1 tablet (5 mg total) by mouth daily. 30 tablet 6  ? amoxicillin (AMOXIL) 500 MG capsule Before dental procedures    ? budesonide (PULMICORT) 180 MCG/ACT inhaler Inhale 1 puff into the lungs as needed.     ? chlorthalidone (HYGROTON) 25 MG tablet Take 1 tablet (25 mg total) by mouth daily. **Keep upcoming appointment for future refills** 90 tablet 0  ? hydrALAZINE (APRESOLINE) 50 MG tablet Take 1 tablet (50 mg total) by mouth in the morning and at bedtime. (Patient taking differently: Take 50 mg by mouth 3 (three) times daily.) 60 tablet 6  ? ipratropium (ATROVENT) 0.06 % nasal spray Place 1 spray into the nose as needed.    ? ketoconazole (NIZORAL) 2 % shampoo Apply 1 application  topically as needed.    ? latanoprost (XALATAN) 0.005 % ophthalmic solution Place 1 drop into both eyes at bedtime.    ? meloxicam (MOBIC) 7.5 MG tablet Take 1 tablet by mouth as needed.    ? Methylsulfonylmethane 1000 MG CAPS Take 2,000 mg by mouth daily.    ? Misc Natural Products (TART CHERRY ADVANCED PO) Take 2 capsules by mouth daily.    ? mupirocin ointment (BACTROBAN) 2 % Apply 1 application topically 2 (two) times daily.    ? omeprazole (PRILOSEC) 40 MG capsule Take 40 mg by mouth daily.    ? rosuvastatin (CRESTOR) 5 MG tablet Take 1 tablet (5 mg total) by mouth daily. 90 tablet 3  ? sertraline (ZOLOFT) 50 MG tablet Take 25 mg by mouth daily. Take 1/2 tablet by mouth daily    ? spironolactone (ALDACTONE) 25 MG tablet Take 1 tablet (25 mg total) by mouth daily. 90 tablet 3  ? SYSTANE ULTRA 0.4-0.3 % SOLN Apply 1 drop to eye 3 (three) times daily.    ? traMADol (ULTRAM) 50 MG tablet Take 50 mg by mouth every 6 (six) hours as needed.    ? TURMERIC PO Take 2 capsules by mouth daily.    ? valsartan (DIOVAN) 320 MG tablet Take 1 tablet (320 mg total) by mouth daily. 30 tablet 6  ? White Petrolatum-Mineral Oil (SYSTANE NIGHTTIME) OINT SMARTSIG:1 Inch(es) In Eye(s) Every Night    ? ?No current facility-administered medications for this visit.  ? ? ?No Known Allergies ? ?Past Medical History:  ?Diagnosis Date  ? Arthritis   ? 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 Complication of anesthesia   ? longer to wake up  ? Foley catheter in place 05/30/2013  ? PT HAD SURG 05/30/13 - CYSTO AND CLOT EVACUATION BLADDER / PROSTATE - UNABLE TO VOID AFTER DISCHARGE - CAME BACK TO ER SAME NIGHT AND HAD FOLEY INSERTED TO DRAINAGE BAG - URINE WAS BLOODY BUT PT REPORTS ON 06/07/13 THAT URINE IN BAG MOSTLY CLEAR NOW.  ? GERD (gastroesophageal reflux disease)   ? History of kidney stones   ? x1  ? Hypertension   ? Muscle tension dysphonia   ? ? ?Blood pressure (!) 166/52, pulse 64, resp. rate 15, height 5\' 9"  (1.753 m),  weight 203 lb 6.4 oz (92.3 kg), SpO2 93 %.   ? ?Essential hypertension ?Patient with resistant hypertension and anxiety, now doing much better since starting sertraline.  For the last 2 months he has not checked his BP at home and admits that he is feeling much better about  it.  Today his pressure is still slightly elevated.  Will add spironolactone 25 mg daily and ask that he get BMET in 2 weeks.  He would ideally like to get off the hydralazine, or at least the mid-day dose.  Once we see how he does with spironolactone, we can consider tapering off mid-day hydralazine dose.  I will see him back in 6 weeks for follow up.   ? ? ?[name] PharmD CPP [facility] ?[facility] ?[address] ?[place], [place] [address] ?[phone] ? ? ? ? ? ? ?

## 2021-04-14 NOTE — Patient Instructions (Signed)
Return for a a follow up appointment Monday May 8 at 2:00 pm ? ?Go to the lab in 2 weeks to check kidney function and potassium  ? ?Check your blood pressure at home three times each week (Monday, Wednesday and Friday), and keep record of the readings. ? ?Take your meds as follows: ? Start spironolactone 25 mg once daily in the mornings ? Start rosuvastatin 5 mg once daily in the evenings ? Continue with all other medications ? ?Bring all of your meds, your BP cuff and your record of home blood pressures to your next appointment.  Exercise as you?re able, try to walk approximately 30 minutes per day.  Keep salt intake to a minimum, especially watch canned and prepared boxed foods.  Eat more fresh fruits and vegetables and fewer canned items.  Avoid eating in fast food restaurants.  ? ? HOW TO TAKE YOUR BLOOD PRESSURE: ?Rest 5 minutes before taking your blood pressure. ? Don?t smoke or drink caffeinated beverages for at least 30 minutes before. ?Take your blood pressure before (not after) you eat. ?Sit comfortably with your back supported and both feet on the floor (don?t cross your legs). ?Elevate your arm to heart level on a table or a desk. ?Use the proper sized cuff. It should fit smoothly and snugly around your bare upper arm. There should be enough room to slip a fingertip under the cuff. The bottom edge of the cuff should be 1 inch above the crease of the elbow. ?Ideally, take 3 measurements at one sitting and record the average. ? ? ?

## 2021-04-15 ENCOUNTER — Encounter: Payer: Self-pay | Admitting: Pharmacist Clinician (PhC)/ Clinical Pharmacy Specialist

## 2021-04-15 NOTE — Assessment & Plan Note (Signed)
Patient with resistant hypertension and anxiety, now doing much better since starting sertraline.  For the last 2 months he has not checked his BP at home and admits that he is feeling much better about it.  Today his pressure is still slightly elevated.  Will add spironolactone 25 mg daily and ask that he get BMET in 2 weeks.  He would ideally like to get off the hydralazine, or at least the mid-day dose.  Once we see how he does with spironolactone, we can consider tapering off mid-day hydralazine dose.  I will see him back in 6 weeks for follow up.   ?

## 2021-04-16 ENCOUNTER — Other Ambulatory Visit (HOSPITAL_COMMUNITY): Payer: Self-pay | Admitting: Family Medicine

## 2021-04-16 DIAGNOSIS — E7800 Pure hypercholesterolemia, unspecified: Secondary | ICD-10-CM

## 2021-04-17 ENCOUNTER — Other Ambulatory Visit: Payer: Self-pay | Admitting: Cardiovascular Disease

## 2021-04-22 ENCOUNTER — Ambulatory Visit (HOSPITAL_COMMUNITY): Payer: Medicare Other | Attending: Cardiovascular Disease

## 2021-04-22 ENCOUNTER — Other Ambulatory Visit: Payer: Self-pay

## 2021-04-22 DIAGNOSIS — I351 Nonrheumatic aortic (valve) insufficiency: Secondary | ICD-10-CM | POA: Diagnosis present

## 2021-04-22 DIAGNOSIS — I350 Nonrheumatic aortic (valve) stenosis: Secondary | ICD-10-CM

## 2021-04-22 LAB — ECHOCARDIOGRAM COMPLETE
Area-P 1/2: 2.73 cm2
P 1/2 time: 522 msec
S' Lateral: 2.6 cm

## 2021-05-02 LAB — BASIC METABOLIC PANEL
BUN/Creatinine Ratio: 21 (ref 10–24)
BUN: 23 mg/dL (ref 8–27)
CO2: 24 mmol/L (ref 20–29)
Calcium: 9.2 mg/dL (ref 8.6–10.2)
Chloride: 103 mmol/L (ref 96–106)
Creatinine, Ser: 1.1 mg/dL (ref 0.76–1.27)
Glucose: 103 mg/dL — ABNORMAL HIGH (ref 70–99)
Potassium: 4.1 mmol/L (ref 3.5–5.2)
Sodium: 142 mmol/L (ref 134–144)
eGFR: 70 mL/min/{1.73_m2} (ref >59)

## 2021-05-26 ENCOUNTER — Ambulatory Visit (INDEPENDENT_AMBULATORY_CARE_PROVIDER_SITE_OTHER): Payer: Medicare Other | Admitting: Pharmacist Clinician (PhC)/ Clinical Pharmacy Specialist

## 2021-05-26 ENCOUNTER — Encounter: Payer: Self-pay | Admitting: Pharmacist Clinician (PhC)/ Clinical Pharmacy Specialist

## 2021-05-26 DIAGNOSIS — E785 Hyperlipidemia, unspecified: Secondary | ICD-10-CM | POA: Diagnosis not present

## 2021-05-26 DIAGNOSIS — I10 Essential (primary) hypertension: Secondary | ICD-10-CM | POA: Diagnosis not present

## 2021-05-26 MED ORDER — ROSUVASTATIN CALCIUM 5 MG PO TABS: ORAL_TABLET | ORAL | 3 refills | Status: DC

## 2021-05-26 NOTE — Patient Instructions (Addendum)
Return for a a follow up appointment August 8 at 2 pm ? ?Take your BP meds as follows: ? Decrease hydralazine to 50 mg twice daily (morning and evening) ? Start rosuvastatin 5 mg twice weekly ? ?Bring all of your meds, your BP cuff and your record of home blood pressures to your next appointment.  Exercise as you?re able, try to walk approximately 30 minutes per day.  Keep salt intake to a minimum, especially watch canned and prepared boxed foods.  Eat more fresh fruits and vegetables and fewer canned items.  Avoid eating in fast food restaurants.  ? ?  ? ?

## 2021-05-26 NOTE — Progress Notes (Signed)
? ? ? ?05/26/2021 ?Kevin [name] ?1945/04/25 ?[id number] ? ? ?HPI:  Kevin [name] is a 76 y.o. male patient of Dr Allyson SabalBerry, with a PMH below who presents today for hypertension clinic follow up.  See previous notes for changes that have been made over the past years.  Today he comes in one month after previous visit.  In that time he has again gone to ED because of elevated home BP readings.  He records readings each morning consistently, but finds he spends most of the day worrying about the results.   ? ?We had a long discussion about his almost obsession with blood pressure readings.  His wife is with him this morning.  Patient reports that he wakes up every morning wondering if this is the day his elevated BP readings will cause him to have a stroke.  He will then take his BP and his mood for the day is based on the reading he gets.  His wife agrees with this information, stating that if his pressure is elevated, he is anxious and stressed the remainder of the day.  He agrees that it is consuming much of his thought process, and endorses that he has recently had what he can only describe as a panic attack.  Last November he had asked to come off BP medications because of "brain fog", and instead treat with natural products (beet juice).  This did not work, causing his BP to become more elevated, which then led to further anxieties and stress.  Getting him back on his previous routine did not stop these feelings.  His most recent medication change was to add amlodipine 5 mg, just 1 week ago.  After that visit he reached out to his PCP and was started on sertraline 25 mg daily.  He was agreeable to not checking his pressure at home until he had been on this for 2 months and returned for a visit here.  At his last visit pressure was still elevated, so spironolactone 25 mg was added.  BMET two weeks later was WNL. ? ?Since that visit, he had a reaction to the sertraline and had to discontinue.  When the dose was  pushed to 75 mg, he developed panic attacks that occurred daily for 3 days.  He cut back to 25 mg for a week then discontinued.  He has decided to work with his [place] and alternative medication providers to try some natural remedies for ongoing anxiety issues.  Has only checked BP once since last visit - at PCP office and was 136 systolic.   ? ?He also notes that he was given statin to help with cholesterol, but that it caused his hands and lower back to ache.  He isn't sure of the name, but would like to try something different.   ? ?Blood Pressure Goal:  130/80 ? ?Current Medications:  valsartan 320 mg qhs chlorthalidone 25 mg qhs, hydralazine 50 mg tid, amlodipine 5 mg qam, spironolactone 25 mg qd ? ?Family Hx:  father had hypertension for many years, died after MI at age 75.  No cardiac history in mother.  One sister with hypertension. ? ?Social Hx:  No tobacco, drinks 2-3 cups of coffee/day, [employer] home brew.  Drinks 2-3 glasses of wine most evenings ? ?Diet: mostly home cooked from scratch, no added salt ? ?Exercise: limited due to vertigo (goes to PT for this) ? ?Home BP readings:  no home readings (he was asked NOT to check), but notes  at PCP last week was 136 systolic ? ?Intolerances: nkda ? ?Labs: 12/02/20:  Na 140, K 3.4, Glu 118, BUN 20, SCr 0.94 GFR >60 ? 3/23:  TC 171, TG 171, HDL 36, LDL 105 ? ? ?Wt Readings from Last 3 Encounters:  ?05/26/21 198 lb 9.6 oz (90.1 kg)  ?04/14/21 203 lb 6.4 oz (92.3 kg)  ?02/25/21 201 lb (91.2 kg)  ? ?BP Readings from Last 3 Encounters:  ?05/26/21 (!) 130/58  ?04/14/21 (!) 166/52  ?03/04/21 (!) 155/65  ? ?Pulse Readings from Last 3 Encounters:  ?05/26/21 62  ?04/14/21 64  ?03/04/21 61  ? ? ?Current Outpatient Medications  ?Medication Sig Dispense Refill  ? amLODipine (NORVASC) 5 MG tablet Take 1 tablet (5 mg total) by mouth daily. 30 tablet 6  ? amoxicillin (AMOXIL) 500 MG capsule Before dental procedures    ? azelastine (ASTELIN) 0.1 % nasal spray Place 1 spray  into both nostrils as needed.    ? budesonide (PULMICORT) 180 MCG/ACT inhaler Inhale 1 puff into the lungs as needed.     ? chlorthalidone (HYGROTON) 25 MG tablet TAKE 1 TABLET BY MOUTH EVERY DAY 90 tablet 2  ? hydrALAZINE (APRESOLINE) 50 MG tablet Take 1 tablet (50 mg total) by mouth in the morning and at bedtime. (Patient taking differently: Take 50 mg by mouth 3 (three) times daily.) 60 tablet 6  ? ipratropium (ATROVENT) 0.06 % nasal spray Place 1 spray into the nose as needed.    ? ketoconazole (NIZORAL) 2 % shampoo Apply 1 application topically as needed.    ? latanoprost (XALATAN) 0.005 % ophthalmic solution Place 1 drop into both eyes at bedtime.    ? meloxicam (MOBIC) 7.5 MG tablet Take 1 tablet by mouth as needed.    ? Methylsulfonylmethane 1000 MG CAPS Take 2,000 mg by mouth daily.    ? Misc Natural Products (TART CHERRY ADVANCED PO) Take 2 capsules by mouth daily.    ? mupirocin ointment (BACTROBAN) 2 % Apply 1 application topically 2 (two) times daily.    ? omeprazole (PRILOSEC) 40 MG capsule Take 40 mg by mouth daily.    ? rosuvastatin (CRESTOR) 5 MG tablet Take 1 tablet by mouth twice weekly. 24 tablet 3  ? spironolactone (ALDACTONE) 25 MG tablet Take 1 tablet (25 mg total) by mouth daily. 90 tablet 3  ? SYSTANE ULTRA 0.4-0.3 % SOLN Apply 1 drop to eye 3 (three) times daily.    ? traMADol (ULTRAM) 50 MG tablet Take 50 mg by mouth every 6 (six) hours as needed.    ? TURMERIC PO Take 2 capsules by mouth daily.    ? valsartan (DIOVAN) 320 MG tablet Take 1 tablet (320 mg total) by mouth daily. 30 tablet 6  ? White Petrolatum-Mineral Oil (SYSTANE NIGHTTIME) OINT SMARTSIG:1 Inch(es) In Eye(s) Every Night    ? ?No current facility-administered medications for this visit.  ? ? ?Allergies  ?Allergen Reactions  ? Crestor [Rosuvastatin]   ?  Myalgias  ? Zoloft [Sertraline]   ?  Panic attacks  ? ? ?Past Medical History:  ?Diagnosis Date  ? Arthritis   ? 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  Complication of anesthesia   ? longer to wake up  ? Foley catheter in place 05/30/2013  ? PT HAD SURG 05/30/13 - CYSTO AND CLOT EVACUATION BLADDER / PROSTATE - UNABLE TO VOID AFTER DISCHARGE - CAME BACK TO ER SAME NIGHT AND HAD FOLEY INSERTED TO DRAINAGE BAG - URINE WAS  BLOODY BUT PT REPORTS ON 06/07/13 THAT URINE IN BAG MOSTLY CLEAR NOW.  ? GERD (gastroesophageal reflux disease)   ? History of kidney stones   ? x1  ? Hypertension   ? Muscle tension dysphonia   ? ? ?Blood pressure (!) 130/58, pulse 62, resp. rate 17, height 5\' 9"  (1.753 m), weight 198 lb 9.6 oz (90.1 kg), SpO2 96 %.   ? ?Essential hypertension ?Patient with resistant hypertension, currently doing well on regimen of 5 medications.  Will have him cut out mid-day dose of hydralazine, so can now take all meds AM/PM.  He will continue to avoid home BP checks.  I will see him back in 3 months for follow up.   ? ?Hyperlipidemia ?Patient notes PCP suggested low dose statin to bring LDL down for primary prevention.  Cannot recall name, but thinks simvastatin.  Reviewed statin function, dosing, side effects.  Will prescribe rosuvastatin 5 mg to take just twice weekly for now.   If patient determines that he was initially on rosuvastatin, can consider switch to atorvastatin 10 mg. ? ? ? PharmD CPP [facility] ?[facility] ?[address] ?[place], [place] ?[phone] ? ? ? ? ? ? ?

## 2021-05-26 NOTE — Assessment & Plan Note (Signed)
Patient notes PCP suggested low dose statin to bring LDL down for primary prevention.  Cannot recall name, but thinks simvastatin.  Reviewed statin function, dosing, side effects.  Will prescribe rosuvastatin 5 mg to take just twice weekly for now.   If patient determines that he was initially on rosuvastatin, can consider switch to atorvastatin 10 mg. ?

## 2021-05-26 NOTE — Assessment & Plan Note (Signed)
Patient with resistant hypertension, currently doing well on regimen of 5 medications.  Will have him cut out mid-day dose of hydralazine, so can now take all meds AM/PM.  He will continue to avoid home BP checks.  I will see him back in 3 months for follow up.   ?

## 2021-05-27 ENCOUNTER — Encounter: Payer: Self-pay | Admitting: Pharmacist Clinician (PhC)/ Clinical Pharmacy Specialist

## 2021-05-27 MED ORDER — ATORVASTATIN CALCIUM 10 MG PO TABS: ORAL_TABLET | ORAL | 3 refills | Status: DC

## 2021-06-09 ENCOUNTER — Telehealth: Payer: Self-pay

## 2021-06-09 NOTE — Telephone Encounter (Signed)
Pt called and stated that they had questions regarding htn concerns with vertigo. Routing to dr. Johnanna Schneiders as the pt requested

## 2021-06-10 NOTE — Telephone Encounter (Signed)
Returned call.  Patient reporting dizziness for past few weeks.  He is unsure if due to BP meds, pain meds or d/c sertraline.  Notes that often occurs with positional changes, but will last longer than what we usually see with orthostatic hypotension (10 minutes or more).  He is seeing PCP today, so I advised he get seated and standing BP readings to see if this is the issue.  Patient voiced understanding.

## 2021-06-18 ENCOUNTER — Other Ambulatory Visit: Payer: Self-pay | Admitting: Pharmacist Clinician (PhC)/ Clinical Pharmacy Specialist

## 2021-06-18 MED ORDER — HYDRALAZINE HCL 50 MG PO TABS: 50.0000 mg | ORAL_TABLET | Freq: Two times a day (BID) | ORAL | 3 refills | Status: DC

## 2021-07-08 ENCOUNTER — Telehealth: Payer: Self-pay

## 2021-07-08 NOTE — Telephone Encounter (Signed)
Patient called, concerned about how easily his hands bruise with just the slightest provocation.  Would like to know if he really needs to take ASA 81 mg daily.  Will forward to Dr. Allyson Sabal for determination.

## 2021-07-08 NOTE — Telephone Encounter (Signed)
Patient left message and has a non-urgent question.  Please call.  Thank you

## 2021-07-16 ENCOUNTER — Other Ambulatory Visit: Payer: Self-pay | Admitting: Cardiovascular Disease

## 2021-07-18 NOTE — Progress Notes (Unsigned)
NEUROLOGY FOLLOW UP OFFICE NOTE  Kevin [name] [id number]  Assessment/Plan:   Uncontrolled blood pressure related to anxeity, now controlled (except elevated today - thinks it is white coat syndrome) Anxiety with panic attacks, aggravated by elevated blood pressure and side effect of sertraline, now controlled Medication side effect to sertraline - now off and feeling better Left benign paroxysmal positional vertigo, resolved   Follow up as needed. Follow up with blood pressure recheck.  If elevated, contact cardiology  Subjective:  Kevin [name] is a 76 year old right-handed male with HTN who follows up for dizziness.  Last seen in October 2021.  At that time he was scheduled for septoplasty for worsening deviated septum and wanted to see if that would improve his dizziness.  Dizziness improved.  Blood pressure has been well-controlled for years with medication.  Then about 18 months ago, he started having uncontrolled blood pressure.  He ad adjustments to his medication but his blood pressure would continue to spike.  About 6 months later, it started spiking to 170s-200 systolic.  He started having worsening anxiety with panic attacks.  He also started having recurrence of vertigo.  He felt trouble concentrating and had memory difficulty.  He had trouble with coordination.  He was seen in the ED on several occasions for the elevated blood pressure and other symptoms.  He was seen in the ED on 12/02/2020 where MRI of brain without contrast personally reviewed revealed chronic small vessel ischemic changes within the periventricular and subcortical cerebral white matter as well as mild global parenchymal volume loss but no acute abnormalities and no small vessel disease involving the cerebellum or brainstem. His PCP started sertraline in early Feb -felt edgy and agitated, trouble opening pillbox, forget why opened refrigerator - side effects persisted after 3 months - weaned off  sertraline - off sertraline for 7 weeks now - 2 weeks ago, started feeling better.  Blood pressure is now controlled.  Went to Dr. Suszanne Conners of ENT and was diagnosed with left BPPV - underwent treatment and now resolved.      HISTORY: He began having episodes of dizziness in 2020.  He describes it as both a spinning and lightheaded sensation.  Sometimes he may feel briefly confused for a moment.  Onset is acute and gradually dissipates.  It may last up to an hour and seems to occur only if he has been wearing his face mask for Covid-19 for an extended period of time or talking with the mask on.  It also may occur if he is playing his trumpet.  At first it was sporadic and would occur every couple of weeks.  By the next year, it has been occurring daily. No nausea, visual disturbance, numbness or weakness.  He underwent a cardiac evaluation.  EKG was unremarkable.  Echocardiogram showed EF 60-65% with Grade II diastolic dysfunction, moderate AR and trivial MR.  He is currently wearing a cardiac event monitor.  In 2019, he saw ENT and was told he had a deviated septum.  He recently followed up with ENT and was told that the deviated septum has significant worsened.  He is scheduled for septoplasty early next month.     Remote MRI of the brain on 05/03/2014 personally reviewed was normal.  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  Extracranial arteries were unremarkable.  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   GERD (gastroesophageal reflux disease)    History of kidney stones    x1   Hypertension    Muscle tension dysphonia     MEDICATIONS: Current Outpatient Medications on File Prior to Visit  Medication Sig Dispense Refill   amLODipine (NORVASC) 5 MG tablet Take 1 tablet (5 mg total) by mouth daily. 30 tablet 6   amoxicillin (AMOXIL) 500 MG capsule Before dental procedures     atorvastatin (LIPITOR) 10 MG tablet Take 1 tablet by mouth twice weekly 24 tablet 3   azelastine (ASTELIN) 0.1 % nasal spray Place 1 spray into both nostrils as needed.     budesonide (PULMICORT) 180 MCG/ACT inhaler Inhale 1 puff into the lungs as needed.      chlorthalidone (HYGROTON) 25 MG tablet TAKE 1 TABLET BY MOUTH EVERY DAY 90 tablet 2   hydrALAZINE (APRESOLINE) 50 MG tablet Take 1 tablet (50 mg total) by mouth in the morning and at bedtime. 180 tablet 3   ipratropium (ATROVENT) 0.06 % nasal spray Place 1 spray into the nose as needed.     ketoconazole (NIZORAL) 2 % shampoo Apply 1 application topically as needed.     latanoprost (XALATAN) 0.005 % ophthalmic solution Place 1 drop into both eyes at bedtime.     meloxicam (MOBIC) 7.5 MG tablet Take 1 tablet by mouth as needed.     Methylsulfonylmethane 1000 MG CAPS Take 2,000 mg by mouth daily.     Misc Natural Products (TART CHERRY ADVANCED PO) Take 2 capsules by mouth daily.     mupirocin ointment (BACTROBAN) 2 % Apply 1 application topically 2 (two) times daily.     omeprazole (PRILOSEC) 40 MG capsule Take 40 mg by mouth daily.     spironolactone (ALDACTONE) 25 MG tablet Take 1 tablet (25 mg total) by mouth daily. 90 tablet 3   SYSTANE ULTRA 0.4-0.3 % SOLN Apply 1 drop to eye 3 (three) times daily.     traMADol (ULTRAM) 50 MG tablet Take 50 mg by mouth  every 6 (six) hours as needed.     TURMERIC PO Take 2 capsules by mouth daily.     valsartan (DIOVAN) 320 MG tablet TAKE 1 TABLET(320 MG) BY MOUTH DAILY 30 tablet 6   White Petrolatum-Mineral Oil (SYSTANE NIGHTTIME) OINT SMARTSIG:1 Inch(es) In Eye(s) Every Night     No current facility-administered medications on file prior to visit.    ALLERGIES: Allergies  Allergen Reactions   Crestor [Rosuvastatin]     Myalgias   Zoloft [Sertraline]     Panic attacks    FAMILY HISTORY: Family History  Problem Relation Age of Onset   Lung cancer Mother    Heart attack Father       Objective:  Blood pressure (!) 168/71, pulse 71, height 5\' 9"  (1.753 m), weight 199 lb (90.3 kg), SpO2 97 %.  General: No acute distress.  Patient appears well-groomed.   Head:  Normocephalic/atraumatic Eyes:  Fundi examined but not visualized Neck: supple, no paraspinal tenderness, full range of motion Heart:  Regular rate and rhythm Lungs:  Clear to auscultation bilaterally Back: No paraspinal tenderness Neurological Exam: alert and oriented to person, place, and time.  Speech fluent and not dysarthric, language intact.  CN II-XII intact. Bulk and tone normal, muscle strength 5/5 throughout.  Sensation to light touch intact.  Deep tendon reflexes 2+ throughout, toes downgoing.  Finger to nose testing intact.  Gait normal, Romberg negative.   Shon Millet, DO  CC: Darrow Bussing, MD

## 2021-07-23 ENCOUNTER — Encounter: Payer: Self-pay | Admitting: Neurology

## 2021-07-23 ENCOUNTER — Ambulatory Visit (INDEPENDENT_AMBULATORY_CARE_PROVIDER_SITE_OTHER): Payer: Medicare Other | Admitting: Neurology

## 2021-07-23 VITALS — BP 168/71 | HR 71 | Ht 69.0 in | Wt 199.0 lb

## 2021-07-23 DIAGNOSIS — H8112 Benign paroxysmal vertigo, left ear: Secondary | ICD-10-CM | POA: Diagnosis not present

## 2021-07-23 DIAGNOSIS — T887XXA Unspecified adverse effect of drug or medicament, initial encounter: Secondary | ICD-10-CM | POA: Diagnosis not present

## 2021-07-23 DIAGNOSIS — F419 Anxiety disorder, unspecified: Secondary | ICD-10-CM

## 2021-07-23 DIAGNOSIS — I998 Other disorder of circulatory system: Secondary | ICD-10-CM | POA: Diagnosis not present

## 2021-07-23 NOTE — Patient Instructions (Signed)
Seems like symptoms related to anxiety and high blood pressure.  Also benign positional vertigo.  Follow up as needed.

## 2021-08-12 ENCOUNTER — Other Ambulatory Visit: Payer: Self-pay | Admitting: Cardiovascular Disease

## 2021-08-26 ENCOUNTER — Ambulatory Visit (INDEPENDENT_AMBULATORY_CARE_PROVIDER_SITE_OTHER): Payer: Medicare Other | Admitting: Pharmacist Clinician (PhC)/ Clinical Pharmacy Specialist

## 2021-08-26 ENCOUNTER — Encounter: Payer: Self-pay | Admitting: Pharmacist Clinician (PhC)/ Clinical Pharmacy Specialist

## 2021-08-26 DIAGNOSIS — I10 Essential (primary) hypertension: Secondary | ICD-10-CM

## 2021-08-26 NOTE — Patient Instructions (Signed)
Return for a a follow up appointment with Dr. Allyson Sabal in the next few months  Check your blood pressure at home daily (if able) and keep record of the readings.  Take your BP meds as follows:  Continue with your current medications  Bring all of your meds, your BP cuff and your record of home blood pressures to your next appointment.  Exercise as you're able, try to walk approximately 30 minutes per day.  Keep salt intake to a minimum, especially watch canned and prepared boxed foods.  Eat more fresh fruits and vegetables and fewer canned items.  Avoid eating in fast food restaurants.    HOW TO TAKE YOUR BLOOD PRESSURE: Rest 5 minutes before taking your blood pressure.  Don't smoke or drink caffeinated beverages for at least 30 minutes before. Take your blood pressure before (not after) you eat. Sit comfortably with your back supported and both feet on the floor (don't cross your legs). Elevate your arm to heart level on a table or a desk. Use the proper sized cuff. It should fit smoothly and snugly around your bare upper arm. There should be enough room to slip a fingertip under the cuff. The bottom edge of the cuff should be 1 inch above the crease of the elbow. Ideally, take 3 measurements at one sitting and record the average.

## 2021-08-26 NOTE — Progress Notes (Signed)
08/26/2021 ZIYON SOLTAU 1945/01/31 [id number]   HPI:  Kevin [name] is a 76 y.o. male patient of Dr Allyson Sabal, with a PMH below who presents today for hypertension clinic follow up.  We have followed him for some time now, as he dealt with anxieties about checking his BP.  At one visit finally had a long discussion about his almost obsession with blood pressure readings.  His wife was with him that morning.  Patient reports that he wakes up every morning wondering if this is the day his elevated BP readings will cause him to have a stroke.  He will then take his BP and his mood for the day is based on the reading he gets.  His wife agrees with this information, stating that if his pressure is elevated, he is anxious and stressed the remainder of the day.  He agrees that it is consuming much of his thought process, and endorses that he has recently had what he can only describe as a panic attack.  Last November he had asked to come off BP medications because of "brain fog", and instead treat with natural products (beet juice).  This did not work, causing his BP to become more elevated, which then led to further anxieties and stress.  He tried sertraline, but caused him side effects, and now he is currently taking ashwaganda twice daily.  He feels that this has been quite helpful.    Today he returns for follow up.  He reports feeling well overall, and has avoided home BP checks since he was last here.    Blood Pressure Goal:  130/80  Current Medications:  valsartan 320 mg qhs chlorthalidone 25 mg qhs, hydralazine 50 mg tid, amlodipine 5 mg qam, spironolactone 25 mg qd  Family Hx:  father had hypertension for many years, died after MI at age 37.  No cardiac history in mother.  One sister with hypertension.  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  has started limiting wine to 1 glass/day  Diet: mostly home cooked from scratch, no added salt;  Exercise:  walking most days, hiking when able. Still some vertigo/imbalance in am only  Home BP readings:  no home readings (he was asked NOT to check),   Intolerances: nkda  Labs:  04/2021:  Na 142, K 4.1, Glu 103, BUN 23, SCr 1.10, GFR 70  12/02/20:  Na 140, K 3.4, Glu 118, BUN 20, SCr 0.94 GFR >60  Wt Readings from Last 3 Encounters:  07/23/21 199 lb (90.3 kg)  05/26/21 198 lb 9.6 oz (90.1 kg)  04/14/21 203 lb 6.4 oz (92.3 kg)   BP Readings from Last 3 Encounters:  08/26/21 (!) 132/58  07/23/21 (!) 168/71  05/26/21 (!) 130/58   Pulse Readings from Last 3 Encounters:  08/26/21 64  07/23/21 71  05/26/21 62    Current Outpatient Medications  Medication Sig Dispense Refill   amLODipine (NORVASC) 5 MG tablet TAKE 1 TABLET(5 MG) BY MOUTH DAILY 30 tablet 6   amoxicillin (AMOXIL) 500 MG capsule Before dental procedures     ASHWAGANDHA PO Take 1 capsule by mouth in the morning and at bedtime.     azelastine (ASTELIN) 0.1 % nasal spray Place 1 spray into both nostrils as needed.     chlorthalidone (HYGROTON) 25 MG tablet TAKE 1 TABLET BY MOUTH EVERY DAY 90 tablet 2   hydrALAZINE (APRESOLINE) 50 MG tablet Take 1 tablet (50 mg  total) by mouth in the morning and at bedtime. 180 tablet 3   ipratropium (ATROVENT) 0.06 % nasal spray Place 1 spray into the nose as needed.     ketoconazole (NIZORAL) 2 % shampoo Apply 1 application topically as needed.     latanoprost (XALATAN) 0.005 % ophthalmic solution Place 1 drop into both eyes at bedtime.     Methylsulfonylmethane 1000 MG CAPS Take 2,000 mg by mouth daily.     Misc Natural Products (TART CHERRY ADVANCED PO) Take 2 capsules by mouth daily.     omeprazole (PRILOSEC) 40 MG capsule Take 40 mg by mouth daily.     spironolactone (ALDACTONE) 25 MG tablet Take 1 tablet (25 mg total) by mouth daily. 90 tablet 3   SYSTANE ULTRA 0.4-0.3 % SOLN Apply 1 drop to eye 3 (three) times daily.     traMADol (ULTRAM) 50 MG tablet Take 50 mg by mouth every 6 (six)  hours as needed. Takes 1/2 pill     TURMERIC PO Take 2 capsules by mouth daily.     valsartan (DIOVAN) 320 MG tablet TAKE 1 TABLET(320 MG) BY MOUTH DAILY 30 tablet 6   White Petrolatum-Mineral Oil (SYSTANE NIGHTTIME) OINT SMARTSIG:1 Inch(es) In Eye(s) Every Night     No current facility-administered medications for this visit.    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   GERD (gastroesophageal reflux disease)    History of kidney stones    x1   Hypertension    Muscle tension dysphonia     Blood pressure (!) 132/58, pulse 64.    Essential hypertension Patient with resistant hypertension, now doing well on combination of 5 medications.  First reading in the office was elevated, but when rechecked manually, it was much improved.  Will have him continue with current medications and no home BP checks. He is due to see Dr. Allyson Sabal and will get that scheduled in the near future.  I can see him back as needed.     [name] PharmD CPP [facility] [address] [place], [place] [address] [phone]

## 2021-08-26 NOTE — Assessment & Plan Note (Signed)
Patient with resistant hypertension, now doing well on combination of 5 medications.  First reading in the office was elevated, but when rechecked manually, it was much improved.  Will have him continue with current medications and no home BP checks. He is due to see Dr. Allyson Sabal and will get that scheduled in the near future.  I can see him back as needed.

## 2021-08-27 ENCOUNTER — Encounter (INDEPENDENT_AMBULATORY_CARE_PROVIDER_SITE_OTHER): Payer: Self-pay

## 2021-09-16 ENCOUNTER — Encounter: Payer: Self-pay | Admitting: Pharmacist Clinician (PhC)/ Clinical Pharmacy Specialist

## 2021-09-17 MED ORDER — VALSARTAN 320 MG PO TABS: ORAL_TABLET | ORAL | 3 refills | Status: DC

## 2021-10-28 ENCOUNTER — Ambulatory Visit: Payer: Medicare Other | Attending: Cardiovascular Disease | Admitting: Cardiovascular Disease

## 2021-10-28 ENCOUNTER — Encounter: Payer: Self-pay | Admitting: Cardiovascular Disease

## 2021-10-28 VITALS — BP 150/60 | HR 57 | Ht 69.0 in | Wt 201.6 lb

## 2021-10-28 DIAGNOSIS — R931 Abnormal findings on diagnostic imaging of heart and coronary circulation: Secondary | ICD-10-CM | POA: Insufficient documentation

## 2021-10-28 DIAGNOSIS — I10 Essential (primary) hypertension: Secondary | ICD-10-CM | POA: Insufficient documentation

## 2021-10-28 DIAGNOSIS — I351 Nonrheumatic aortic (valve) insufficiency: Secondary | ICD-10-CM | POA: Diagnosis not present

## 2021-10-28 DIAGNOSIS — E785 Hyperlipidemia, unspecified: Secondary | ICD-10-CM | POA: Insufficient documentation

## 2021-10-28 NOTE — Assessment & Plan Note (Signed)
Coronary calcium score of 537 measured 05/07/2017.  He is very active, walks 3 to 5 miles a day, bicycles and does yoga and is completely asymptomatic.  He did have a Myoview stress test performed after that on 06/22/2017 which was low risk and nonischemic.

## 2021-10-28 NOTE — Assessment & Plan Note (Signed)
History of essential hypertension with a whitecoat hypertension component.  Blood pressure today was 150/60, usually much lower than this.  He is on amlodipine, chlorthalidone, hydralazine once a day, spironolactone and valsartan.

## 2021-10-28 NOTE — Assessment & Plan Note (Signed)
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  His aortic root is not dilated and he has no other valvular abnormalities.  He is very active and denies any symptoms.  We will continue to follow him noninvasively on annual basis.

## 2021-10-28 NOTE — Patient Instructions (Signed)
Medication Instructions:  Your physician recommends that you continue on your current medications as directed. Please refer to the Current Medication list given to you today.  *If you need a refill on your cardiac medications before your next appointment, please call your pharmacy*  Testing/Procedures: Your physician has requested that you have an echocardiogram (April 2024). Echocardiography is a painless test that uses sound waves to create images of your heart. It provides your doctor with information about the size and shape of your heart and how well your heart's chambers and valves are working. This procedure takes approximately one hour. There are no restrictions for this procedure.  Follow-Up: At [facility], you and your health needs are our priority.  As part of our continuing mission to provide you with exceptional heart care, we have created designated Provider Care Teams.  These Care Teams include your primary Cardiologist (physician) and Advanced Practice Providers (APPs -  Physician Assistants and Nurse Practitioners) who all work together to provide you with the care you need, when you need it.  We recommend signing up for the patient portal called "MyChart".  Sign up information is provided on this After Visit Summary.  MyChart is used to connect with patients for Virtual Visits (Telemedicine).  Patients are able to view lab/test results, encounter notes, upcoming appointments, etc.  Non-urgent messages can be sent to your provider as well.   To learn more about what you can do with MyChart, go to [email or web address].    Your next appointment:   12 month(s)  The format for your next appointment:   In Person  Provider:   Quay Burow, MD     Other Instructions You have been referred to: Lipid clinic with our pharmacist

## 2021-10-28 NOTE — Progress Notes (Signed)
10/28/2021 Kevin [name]   [birth year]  [id number]  Primary Physician Darrow Bussing, MD Primary Cardiologist: Runell Gess MD Nicholes Calamity, [place]  HPI:  Kevin [name] is a 76 y.o.  mildly overweight married Caucasian male who works at [employer]. I last saw him in the office 11/19/2020.  He has a history of hypertension on ACE inhibitor as in the past. I last saw him in the office 09/26/14. His history otherwise is remarkable for multiple joint replacements as well as prostate resection because of prostate surgery, with ureteral complications. His ACE inhibitor was recently changed to an ARB (losartan) because of a hoarse voice. He probably needs vocal cord surgery next week. He was seen at [facility] today for preoperative evaluation with blood pressure was measured multiple times in the 190/90 range. Since I saw him in the office we have been titrating his blood pressure medicines to the point now that his blood pressure is much better controlled in the 130/6070 range. He currently is on hydralazine, Benicar andamlodipine. He denies chest pain or shortness of breath. He did have some vague ocular phenomenon for which he saw his ophthalmologist. 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 I referred him to Dr. Pearlean Brownie for neurologic evaluation. An MRI/MRA was unremarkable. His blood pressure has been under much better control on his current medical regimen. He follows his blood pressure as an outpatient at home. He has had issues with morning mucus production and coughing/gagging for the last 12 months or so. This has been evaluated by ENT, gastric neurology and allergy. There has been no uniform consensus on the etiology.  This mucus production has somewhat improved over last several months. He is otherwise asymptomatic with excellent blood pressure measurements at home on minimal medications.   Since I saw him in the office a year ago he continues to do well.  His  hoarseness has improved.  His blood pressures under better control.  He is very active and walks 3 to 5 miles a day, does yoga and bikes.  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  He recently got back last month from visiting [place] and [place] with his wife.  He is completely asymptomatic.  His most recent 2D echo performed/4/23 revealed normal LV systolic function.  He has moderate to severe aortic insufficiency.  His aortic root is not dilated.  His end-diastolic dimension is unchanged at 53 mm.     Current Meds  Medication Sig   amLODipine (NORVASC) 5 MG tablet TAKE 1 TABLET(5 MG) BY MOUTH DAILY   amoxicillin (AMOXIL) 500 MG capsule Before dental procedures   ASHWAGANDHA PO Take 1 capsule by mouth in the morning and at bedtime.   azelastine (ASTELIN) 0.1 % nasal spray Place 1 spray into both nostrils as needed.   chlorthalidone (HYGROTON) 25 MG tablet TAKE 1 TABLET BY MOUTH EVERY DAY   hydrALAZINE (APRESOLINE) 50 MG tablet Take 1 tablet (50 mg total) by mouth in the morning and at bedtime.   ipratropium (ATROVENT) 0.06 % nasal spray Place 1 spray into the nose as needed.   ketoconazole (NIZORAL) 2 % shampoo Apply 1 application topically as needed.   latanoprost (XALATAN) 0.005 % ophthalmic solution Place 1 drop into both eyes at bedtime.   meloxicam (MOBIC) 7.5 MG tablet Take 7.5 mg by mouth daily as needed.   Methylsulfonylmethane 1000 MG CAPS Take 2,000 mg by mouth daily.   Misc Natural Products (TART  CHERRY ADVANCED PO) Take 2 capsules by mouth daily.   omeprazole (PRILOSEC) 40 MG capsule Take 40 mg by mouth daily.   spironolactone (ALDACTONE) 25 MG tablet Take 1 tablet (25 mg total) by mouth daily.   SYSTANE ULTRA 0.4-0.3 % SOLN Apply 1 drop to eye 3 (three) times daily.   traMADol (ULTRAM) 50 MG tablet Take 50 mg by mouth every 6 (six) hours as needed. Takes 1/2 pill   TURMERIC PO Take 2 capsules by mouth daily.   valsartan (DIOVAN) 320 MG tablet TAKE 1 TABLET(320 MG) BY MOUTH  DAILY     Allergies  Allergen Reactions   Crestor [Rosuvastatin]     Myalgias   Zoloft [Sertraline]     Panic attacks    Social History   Socioeconomic History   Marital status: Married    Spouse name: Not on file   Number of children: 2   Years of education: College gr   Highest education level: Not on file  Occupational History   Occupation: Lobbyist  Tobacco Use   Smoking status: Never   Smokeless tobacco: Never  Vaping Use   Vaping Use: Never used  Substance and Sexual Activity   Alcohol use: Yes    Comment: social 1-2 drinks weekly   Drug use: No   Sexual activity: Yes  Other Topics Concern   Not on file  Social History Narrative   Right handed   Lives with wife two story home bedroom is on first level   Caffeine 2 cups of half and half               Social Determinants of Health   Financial Resource Strain: Not on file  Food Insecurity: Not on file  Transportation Needs: Not on file  Physical Activity: Not on file  Stress: Not on file  Social Connections: Not on file  Intimate Partner Violence: Not on file     Review of Systems: General: negative for chills, fever, night sweats or weight changes.  Cardiovascular: negative for chest pain, dyspnea on exertion, edema, orthopnea, palpitations, paroxysmal nocturnal dyspnea or shortness of breath Dermatological: negative for rash Respiratory: negative for cough or wheezing Urologic: negative for hematuria Abdominal: negative for nausea, vomiting, diarrhea, bright red blood per rectum, melena, or hematemesis Neurologic: negative for visual changes, syncope, or dizziness All other systems reviewed and are otherwise negative except as noted above.    Blood pressure (!) 150/60, pulse (!) 57, height 5\' 9"  (1.753 m), weight 201 lb 9.6 oz (91.4 kg), SpO2 97 %.  General appearance: alert and no distress Neck: no adenopathy, no carotid bruit, no JVD, supple, symmetrical, trachea midline, and thyroid  not enlarged, symmetric, no tenderness/mass/nodules Lungs: clear to auscultation bilaterally Heart: regular rate and rhythm, S1, S2 normal, no murmur, click, rub or gallop Extremities: extremities normal, atraumatic, no cyanosis or edema Pulses: 2+ and symmetric Skin: Skin color, texture, turgor normal. No rashes or lesions Neurologic: Grossly normal  EKG sinus bradycardia 57 with borderline evidence of LVH and nonspecific ST and T wave changes.  Personally reviewed this EKG.  ASSESSMENT AND PLAN:   Essential hypertension History of essential hypertension with a whitecoat hypertension component.  Blood pressure today was 150/60, usually much lower than this.  He is on amlodipine, chlorthalidone, hydralazine once a day, spironolactone and valsartan.  Moderate aortic insufficiency History of moderate to severe aortic insufficiency by 2D echo most recently checked 4//23 with a left ventricular diastolic dimension of 53 mm stable  from prior transthoracic echo.  His aortic root is not dilated and he has no other valvular abnormalities.  He is very active and denies any symptoms.  We will continue to follow him noninvasively on annual basis.  Hyperlipidemia History of hyperlipidemia intolerant to statin therapy with lipid profile performed 04/07/2021 revealing total cholesterol 171, LDL 105 and HDL of 38.  Given his elevated coronary calcium score above 500 I would like his LDL to be less than 70 for secondary prevention.  I am going to refer him to our Pharm.DBaxter Hire to discuss initiation of a PCSK9.  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  He is very active, walks 3 to 5 miles a day, bicycles and does yoga and is completely asymptomatic.  He did have a Myoview stress test performed after that on 06/22/2017 which was low risk and nonischemic.     Runell Gess MD FACP,FACC,FAHA, [facility] 10/28/2021 4:08 PM

## 2021-10-28 NOTE — Assessment & Plan Note (Signed)
History of hyperlipidemia intolerant to statin therapy with lipid profile performed 04/07/2021 revealing total cholesterol 171, LDL 105 and HDL of 38.  Given his elevated coronary calcium score above 500 I would like his LDL to be less than 70 for secondary prevention.  I am going to refer him to our Pharm.DCyril Mourning to discuss initiation of a PCSK9.

## 2021-12-01 ENCOUNTER — Ambulatory Visit: Payer: Medicare Other

## 2021-12-04 ENCOUNTER — Encounter: Payer: Self-pay | Admitting: Physical Therapy

## 2021-12-04 ENCOUNTER — Other Ambulatory Visit: Payer: Self-pay

## 2021-12-04 ENCOUNTER — Ambulatory Visit: Payer: Medicare Other | Attending: Otolaryngology | Admitting: Physical Therapy

## 2021-12-04 DIAGNOSIS — R42 Dizziness and giddiness: Secondary | ICD-10-CM | POA: Insufficient documentation

## 2021-12-04 DIAGNOSIS — R2681 Unsteadiness on feet: Secondary | ICD-10-CM | POA: Insufficient documentation

## 2021-12-04 NOTE — Therapy (Signed)
OUTPATIENT PHYSICAL THERAPY VESTIBULAR EVALUATION     Patient Name: Kevin [name] MRN: [id number] DOB:[birth year], 76 y.o., male Today's Date: 12/04/2021  END OF SESSION:  PT End of Session - 12/04/21 1623     Visit Number 1    Number of Visits 8    Date for PT Re-Evaluation 01/02/22    Authorization Type Medicare/AARP-may need to check KX as pt was seen earlier this year for PT    PT Start Time 1618    PT Stop Time 1705    PT Time Calculation (min) 47 min    Activity Tolerance Patient tolerated treatment well    Behavior During Therapy [facility] for tasks assessed/performed             Past Medical History:  Diagnosis Date   Arthritis    osteoarthritis-hips/ shoulders- no issues at present   Complication of anesthesia    longer to wake up   Foley catheter in place 05/30/2013   PT HAD SURG 05/30/13 - CYSTO AND CLOT EVACUATION BLADDER / PROSTATE - UNABLE TO VOID AFTER DISCHARGE - CAME BACK TO ER SAME NIGHT AND HAD FOLEY INSERTED TO DRAINAGE BAG - URINE WAS BLOODY BUT PT REPORTS ON 06/07/13 THAT URINE IN BAG MOSTLY CLEAR NOW.   GERD (gastroesophageal reflux disease)    History of kidney stones    x1   Hypertension    Muscle tension dysphonia    Past Surgical History:  Procedure Laterality Date   BACK SURGERY     lumbar    CYSTOSCOPY W/ URETEROSCOPY W/ LITHOTRIPSY     CYSTOSCOPY/RETROGRADE/URETEROSCOPY/STONE EXTRACTION WITH BASKET N/A 05/30/2013   Procedure: CYSTOSCOPY , BLADDER BIOPSY AND CLOT EVACUATION , CAUTERIZATIO OF LATERAL RIGHT AND LEFT LOBE OF PROSTATE;  Surgeon: Ailene Rud, MD;  Location: [facility];  Service: Urology;  Laterality: N/A;   HERNIA REPAIR     inguinal   INSERTION OF MESH N/A 11/24/2017   Procedure: INSERTION OF MESH;  Surgeon: Donnie Mesa, MD;  Location: [place];  Service: General;  Laterality: N/A;   JOINT REPLACEMENT     '00-left/'08 [facility]   KNEE ARTHROSCOPY     left knee meniscus surgery   PROSTATE SURGERY Bilateral    7- 8 yrs ago    SHOULDER ARTHROSCOPY Right    SHOULDER ARTHROTOMY Left    TRANSURETHRAL RESECTION OF PROSTATE N/A 06/09/2013   Procedure: TRANSURETHRAL RESECTION OF THE PROSTATE (TURP) WITH GYRUS;  Surgeon: Ailene Rud, MD;  Location: [facility];  Service: Urology;  Laterality: N/A;   UMBILICAL HERNIA REPAIR N/A 11/24/2017   Procedure: UMBILICAL HERNIA REPAIR WITH MESH;  Surgeon: Donnie Mesa, MD;  Location: [place];  Service: General;  Laterality: N/A;   Patient Active Problem List   Diagnosis Date Noted   Elevated coronary artery calcium score 10/28/2021   Hyperlipidemia 05/26/2021   Arthritis 12/18/2020   Palpitations 04/27/2017   Moderate aortic insufficiency 04/27/2017   Blurred vision, bilateral 05/31/2014   Essential hypertension 01/25/2014   Benign prostatic hypertrophy 06/09/2013    PCP: Lujean Amel, MD  REFERRING PROVIDER:   Raylene Miyamoto, MD    REFERRING DIAG:  R42 (ICD-10-CM) - Dizziness   THERAPY DIAG:  Dizziness and giddiness  Unsteadiness on feet  ONSET DATE:  11/19/2021 (MD referral)  Rationale for Evaluation and Treatment: Rehabilitation  SUBJECTIVE:   SUBJECTIVE STATEMENT: The vertigo lightheadedness and dizziness got a lot better after bout of therapy in early 2023.  Over the past 4-6 weeks, it  has gotten worse.  Recently stopped the Tramadol, and had some troubling side effects.  Still doing the exercises from previous bout of therapy. Pt accompanied by: self  PERTINENT HISTORY: He began having episodes of dizziness in 2020.  He describes it as both a spinning and lightheaded sensation.  Sometimes he may feel briefly confused for a moment.  Onset is acute and gradually dissipates.  (Per neurologist notes)  PAIN:  Are you having pain? Yes: NPRS scale: 4-5/10 Pain location: back Pain description: troublesome, hx of back pain Aggravating factors: "old age" Relieving factors: stretching, yoga, tai chi  PRECAUTIONS: Fall  WEIGHT BEARING RESTRICTIONS:  No  FALLS: Has patient fallen in last 6 months? No  LIVING ENVIRONMENT: Lives with: lives with their spouse Lives in: House/apartment Stairs: Yes: Internal: 2 steps; on right going up and External: 8>8 steps; on right going up and on left going up Has following equipment at home: None  PLOF: Independent, Independent with gait, and Leisure: yoga, stretch, tai chi, walks daily  PATIENT GOALS: "To lessen the vertigo and dizziness"  OBJECTIVE:   DIAGNOSTIC FINDINGS: NA for this episode of care  COGNITION: Overall cognitive status: Within functional limits for tasks assessed    Cervical ROM:  WFL  Active A/PROM (deg) eval  Flexion   Extension   Right lateral flexion   Left lateral flexion   Right rotation   Left rotation   (Blank rows = not tested)    FUNCTIONAL TESTS:  Functional gait assessment: 19/30 (declined from 25/30 from last bout of therapy, ending 03/04/2021)   [facility] PT Assessment - 12/04/21 0001       Functional Gait  Assessment   Gait assessed  Yes    Gait Level Surface Walks 20 ft, slow speed, abnormal gait pattern, evidence for imbalance or deviates 10-15 in outside of the 12 in walkway width. Requires more than 7 sec to ambulate 20 ft.   7.12   Change in Gait Speed Able to smoothly change walking speed without loss of balance or gait deviation. Deviate no more than 6 in outside of the 12 in walkway width.    Gait with Horizontal Head Turns Performs head turns with moderate changes in gait velocity, slows down, deviates 10-15 in outside 12 in walkway width but recovers, can continue to walk.   9.84 sec   Gait with Vertical Head Turns Performs task with slight change in gait velocity (eg, minor disruption to smooth gait path), deviates 6 - 10 in outside 12 in walkway width or uses assistive device   8.81   Gait and Pivot Turn Pivot turns safely within 3 sec and stops quickly with no loss of balance.   mild dizziness   Step Over Obstacle Is able to step over 2  stacked shoe boxes taped together (9 in total height) without changing gait speed. No evidence of imbalance.    Gait with Narrow Base of Support Ambulates 4-7 steps.    Gait with Eyes Closed Walks 20 ft, slow speed, abnormal gait pattern, evidence for imbalance, deviates 10-15 in outside 12 in walkway width. Requires more than 9 sec to ambulate 20 ft.   10.88   Ambulating Backwards Walks 20 ft, uses assistive device, slower speed, mild gait deviations, deviates 6-10 in outside 12 in walkway width.    Steps Alternating feet, must use rail.    Total Score 19             PATIENT SURVEYS:  FOTO 50/goal is  60  VESTIBULAR ASSESSMENT:  GENERAL OBSERVATION: No acute distress.   SYMPTOM BEHAVIOR:  Subjective history: Pt reports "waviness", things look wavy when I look down.  Slight lightheadedness.  Has worsened over the past 4-6 weeks despite doing the previous therapy exercises.  Non-Vestibular symptoms:  has had high blood pressure, but under control; recent stopped Tramadol; upcoming cataract surgery in January for both eyes   Type of dizziness: Imbalance (Disequilibrium), Lightheadedness/Faint, and "waviness"  Frequency: "with me most of the time"  Duration: seconds-minutes  Aggravating factors: Induced by motion: looking up at the ceiling, bending down to the ground, turning body quickly, and turning head quickly  Relieving factors: head stationary  Progression of symptoms: unchanged  OCULOMOTOR EXAM:  Ocular Alignment: abnormal and L eye depressed  Ocular ROM: No Limitations  Spontaneous Nystagmus: absent  Gaze-Induced Nystagmus: absent  Smooth Pursuits: intact and c/o mild dizziness vertically  Saccades:  mild undershooting horizontal directions, some catch up noted L eye with L saccades  Convergence/Divergence: 2-3 cm     VESTIBULAR - OCULAR REFLEX:   Slow VOR: Normal  VOR Cancellation: Normal  Head-Impulse Test: HIT Right: negative HIT Left: negative  Dynamic Visual  Acuity:  NA   POSITIONAL TESTING: Right Dix-Hallpike: no nystagmus, no symptoms, "quick dizzy" upon sitting up, and Duration: NA Left Dix-Hallpike: no nystagmus Right Roll Test: no nystagmus Left Roll Test: no nystagmus   M-CTSIB  Condition 1: Firm Surface, EO 30 Sec, Normal Sway  Condition 2: Firm Surface, EC 30 Sec, Normal Sway  Condition 3: Foam Surface, EO 30 Sec, Mild Sway  Condition 4: Foam Surface, EC 30 Sec, Mild Sway      OTHOSTATICS: not done    VESTIBULAR TREATMENT:                                                                                                   DATE: 12/04/2021   PATIENT EDUCATION: Education details: Eval results, POC; continue gaze stabilization exercises at home seated/standing and increase speed of head motions  Person educated: Patient Education method: Explanation and Demonstration Education comprehension: verbalized understanding and returned demonstration  HOME EXERCISE PROGRAM:  (Will need to update from previous bout of therapy)  GOALS: Goals reviewed with patient? Yes  SHORT TERM GOALS: Target date: = LTGs   LONG TERM GOALS: Target date: 12/152023   Pt will be independent with HEP for improved functional mobility and decreased dizziness. Baseline:  Goal status: INITIAL  2.  Pt will improve FGA score to at least 22/30 to decrease fall risk. Baseline: 19/30 Goal status: INITIAL  3.  Pt will improve FOTO score to at least 60 to demonstrate improved overall functional mobility. Baseline: 50 at eval Goal status: INITIAL   ASSESSMENT:  CLINICAL IMPRESSION: Patient is a 76 y.o. male who was seen today for physical therapy evaluation and treatment for dizziness.   He has had vestibular therapy at this clinic earlier this year and reports doing well until about 4-6 weeks ago, when he noted return of increased dizziness and lightheadedness.  He doesn't report spinning, but more describes dizziness  with bending, looking up to ceiling  and turns.  He has mild sway on conditions 3-4 on MCTSIB, he has some dizziness with vertical gaze on oculomotor testing.  He does demonstrate decreased functional mobility/balance on FGA score of 19/30, which is indicative of fall risk and decreased since his February discharge score of 25/30.  With positional testing, pt does not have any nystagmus or symptoms, so it does not appear that BPPV is present at eval today, but will continue to assess as needed.  He reports an increased overall activity level recently, and perhaps this coupled with slightly decreased vestibular system function for balance is leading to decreased balance and unsteadiness/dizziness feelings.  He will benefit from skilled PT towards goals for improved dizziness, functional mobility and balance for decreased fall risk.  OBJECTIVE IMPAIRMENTS: Abnormal gait, decreased balance, and dizziness.   ACTIVITY LIMITATIONS: bending, reach over head, and locomotion level  PARTICIPATION LIMITATIONS: community activity and outdoor fitness activities  PERSONAL FACTORS: Past/current experiences and 3+ comorbidities: See PMH above  are also affecting patient's functional outcome.   REHAB POTENTIAL: Good  CLINICAL DECISION MAKING: Stable/uncomplicated  EVALUATION COMPLEXITY: Low   PLAN:  PT FREQUENCY: 1-2x/week  PT DURATION: 4 weeks plus eval  PLANNED INTERVENTIONS: Therapeutic exercises, Therapeutic activity, Neuromuscular re-education, Balance training, Gait training, Patient/Family education, Self Care, Vestibular training, and Canalith repositioning  PLAN FOR NEXT SESSION: Review pt's current HEP and update as appropriate.  Work on gaze stabilization in standing, work on progressing compliant surfaces with head motions.  Work on gait, turns, activities for habituation.   Frazier Butt., PT 12/04/2021, 5:23 PM   [facility] at [facility] [address] [place], [place], [place], [place]  [address] Phone # [phone] Fax # [phone]

## 2021-12-09 ENCOUNTER — Ambulatory Visit: Payer: Medicare Other | Admitting: Physical Therapy

## 2021-12-15 ENCOUNTER — Encounter: Payer: Self-pay | Admitting: Physical Therapy

## 2021-12-15 ENCOUNTER — Ambulatory Visit: Payer: Medicare Other | Admitting: Physical Therapy

## 2021-12-15 DIAGNOSIS — R2681 Unsteadiness on feet: Secondary | ICD-10-CM

## 2021-12-15 DIAGNOSIS — R42 Dizziness and giddiness: Secondary | ICD-10-CM | POA: Diagnosis not present

## 2021-12-15 NOTE — Therapy (Signed)
OUTPATIENT PHYSICAL THERAPY VESTIBULAR TREATMENT NOTES     Patient Name: Kevin [name] MRN: [id number] DOB:1945/06/26, 76 y.o., male Today's Date: 12/15/2021  END OF SESSION:  PT End of Session - 12/15/21 1313     Visit Number 2    Number of Visits 8    Date for PT Re-Evaluation 01/02/22    Authorization Type Medicare/AARP-may need to check KX as pt was seen earlier this year for PT (not KX as of 12/15/2021)    PT Start Time 1315    PT Stop Time 1356    PT Time Calculation (min) 41 min    Activity Tolerance Patient tolerated treatment well    Behavior During Therapy [facility] for tasks assessed/performed             Past Medical History:  Diagnosis Date   Arthritis    osteoarthritis-hips/ shoulders- no issues at present   Complication of anesthesia    longer to wake up   Foley catheter in place 05/30/2013   PT HAD SURG 05/30/13 - CYSTO AND CLOT EVACUATION BLADDER / PROSTATE - UNABLE TO VOID AFTER DISCHARGE - CAME BACK TO ER SAME NIGHT AND HAD FOLEY INSERTED TO DRAINAGE BAG - URINE WAS BLOODY BUT PT REPORTS ON 06/07/13 THAT URINE IN BAG MOSTLY CLEAR NOW.   GERD (gastroesophageal reflux disease)    History of kidney stones    x1   Hypertension    Muscle tension dysphonia    Past Surgical History:  Procedure Laterality Date   BACK SURGERY     lumbar    CYSTOSCOPY W/ URETEROSCOPY W/ LITHOTRIPSY     CYSTOSCOPY/RETROGRADE/URETEROSCOPY/STONE EXTRACTION WITH BASKET N/A 05/30/2013   Procedure: CYSTOSCOPY , BLADDER BIOPSY AND CLOT EVACUATION , CAUTERIZATIO OF LATERAL RIGHT AND LEFT LOBE OF PROSTATE;  Surgeon: Ailene Rud, MD;  Location: [facility];  Service: Urology;  Laterality: N/A;   HERNIA REPAIR     inguinal   INSERTION OF MESH N/A 11/24/2017   Procedure: INSERTION OF MESH;  Surgeon: Donnie Mesa, MD;  Location: [place];  Service: General;  Laterality: N/A;   JOINT REPLACEMENT     '00-left/'08 RTHA   KNEE ARTHROSCOPY     left knee meniscus surgery   PROSTATE SURGERY  Bilateral    7- 8 yrs ago   SHOULDER ARTHROSCOPY Right    SHOULDER ARTHROTOMY Left    TRANSURETHRAL RESECTION OF PROSTATE N/A 06/09/2013   Procedure: TRANSURETHRAL RESECTION OF THE PROSTATE (TURP) WITH GYRUS;  Surgeon: Ailene Rud, MD;  Location: [facility];  Service: Urology;  Laterality: N/A;   UMBILICAL HERNIA REPAIR N/A 11/24/2017   Procedure: UMBILICAL HERNIA REPAIR WITH MESH;  Surgeon: Donnie Mesa, MD;  Location: [place];  Service: General;  Laterality: N/A;   Patient Active Problem List   Diagnosis Date Noted   Elevated coronary artery calcium score 10/28/2021   Hyperlipidemia 05/26/2021   Arthritis 12/18/2020   Palpitations 04/27/2017   Moderate aortic insufficiency 04/27/2017   Blurred vision, bilateral 05/31/2014   Essential hypertension 01/25/2014   Benign prostatic hypertrophy 06/09/2013    PCP: Lujean Amel, MD  REFERRING PROVIDER:   Raylene Miyamoto, MD    REFERRING DIAG:  R42 (ICD-10-CM) - Dizziness   THERAPY DIAG:  Unsteadiness on feet  Dizziness and giddiness  ONSET DATE:  11/19/2021 (MD referral)  Rationale for Evaluation and Treatment: Rehabilitation  SUBJECTIVE:   SUBJECTIVE STATEMENT: Fel that there was some improvement after the first meeting.  Was trending better, but today it  doesn't feel as good.  The dizziness/unsteadiness feeling is still there. Pt accompanied by: self  PERTINENT HISTORY: He began having episodes of dizziness in 2020.  He describes it as both a spinning and lightheaded sensation.  Sometimes he may feel briefly confused for a moment.  Onset is acute and gradually dissipates.  (Per neurologist notes)  PAIN:  Are you having pain? Yes: NPRS scale: 4-5/10 Pain location: back Pain description: troublesome, hx of back pain Aggravating factors: "old age" Relieving factors: stretching, yoga, tai chi  PRECAUTIONS: Fall  WEIGHT BEARING RESTRICTIONS: No  FALLS: Has patient fallen in last 6 months? No  LIVING  ENVIRONMENT: Lives with: lives with their spouse Lives in: House/apartment Stairs: Yes: Internal: 2 steps; on right going up and External: 8>8 steps; on right going up and on left going up Has following equipment at home: None  PLOF: Independent, Independent with gait, and Leisure: yoga, stretch, tai chi, walks daily  PATIENT GOALS: "To lessen the vertigo and dizziness"  OBJECTIVE:    TODAY'S TREATMENT: 12/15/2021 Activity Comments  Reviewed previous HEP: -gaze stabilization in standing horizontal and vertical -180 degree turns -gaze stabilization with gait -forward step on Airex -Romberg stance on Airex Brief onset of symptoms, but quickly subside  Airex: -EC feet apart head turns/nods x 10 -EC feet together head turns/nods x 10   Forward/back stepping with added vertical head motion x 10 Brings on slight unsteadiness; more unsteadiness with L as stance leg  Romberg stance with gaze stabilization at target-horizontal and vertical; repeated with partial tandem   Tandem gait forward/back x 2 reps; tandem march 4 reps Supervision, no LOB; good ability to look ahead at target      Access Code: [email or web address] URL: [email or web address] Date: 12/15/2021 Prepared by: [facility]  Program Notes Forward/back step with head motion up on the forward step, head motion down on the step backwards.  Repeat 10 times on each leg.  Exercises - Gaze Stability (VOR) x1 Feet Together on Firm Ground With Horizontal Head Turns  - 1 x daily - 7 x weekly - 3 sets - 30 sec hold - Gaze Stability (VOR) x1 Feet Together on Firm Ground With Vertical Head Turns  - 1 x daily - 7 x weekly - 3 sets - 30 sec hold - Romberg Stance Eyes Closed on Foam Pad  - 1 x daily - 7 x weekly - 3 sets - 30 sec hold  PATIENT EDUCATION: Education details: HEP progression from initial HEP (from last bout of therapy)  Person educated: Patient Education method: Explanation,  Demonstration, and Handouts Education comprehension: verbalized understanding and returned demonstration  -------------------------------------------------------------------------------------- Measures below from initial evaluation:  DIAGNOSTIC FINDINGS: NA for this episode of care  COGNITION: Overall cognitive status: Within functional limits for tasks assessed    Cervical ROM:  WFL  Active A/PROM (deg) eval  Flexion   Extension   Right lateral flexion   Left lateral flexion   Right rotation   Left rotation   (Blank rows = not tested)    FUNCTIONAL TESTS:  Functional gait assessment: 19/30 (declined from 25/30 from last bout of therapy, ending 03/04/2021)     PATIENT SURVEYS:  FOTO 50/goal is 60  VESTIBULAR ASSESSMENT:  GENERAL OBSERVATION: No acute distress.   SYMPTOM BEHAVIOR:  Subjective history: Pt reports "waviness", things look wavy when I look down.  Slight lightheadedness.  Has worsened over the past 4-6 weeks despite doing the previous therapy exercises.  Non-Vestibular symptoms:  has had high blood pressure, but under control; recent stopped Tramadol; upcoming cataract surgery in January for both eyes   Type of dizziness: Imbalance (Disequilibrium), Lightheadedness/Faint, and "waviness"  Frequency: "with me most of the time"  Duration: seconds-minutes  Aggravating factors: Induced by motion: looking up at the ceiling, bending down to the ground, turning body quickly, and turning head quickly  Relieving factors: head stationary  Progression of symptoms: unchanged  OCULOMOTOR EXAM:  Ocular Alignment: abnormal and L eye depressed  Ocular ROM: No Limitations  Spontaneous Nystagmus: absent  Gaze-Induced Nystagmus: absent  Smooth Pursuits: intact and c/o mild dizziness vertically  Saccades:  mild undershooting horizontal directions, some catch up noted L eye with L saccades  Convergence/Divergence: 2-3 cm     VESTIBULAR - OCULAR REFLEX:   Slow VOR:  Normal  VOR Cancellation: Normal  Head-Impulse Test: HIT Right: negative HIT Left: negative  Dynamic Visual Acuity:  NA   POSITIONAL TESTING: Right Dix-Hallpike: no nystagmus, no symptoms, "quick dizzy" upon sitting up, and Duration: NA Left Dix-Hallpike: no nystagmus Right Roll Test: no nystagmus Left Roll Test: no nystagmus   M-CTSIB  Condition 1: Firm Surface, EO 30 Sec, Normal Sway  Condition 2: Firm Surface, EC 30 Sec, Normal Sway  Condition 3: Foam Surface, EO 30 Sec, Mild Sway  Condition 4: Foam Surface, EC 30 Sec, Mild Sway      OTHOSTATICS: not done    VESTIBULAR TREATMENT:                                                                                                   DATE: 12/04/2021   PATIENT EDUCATION: Education details: Eval results, POC; continue gaze stabilization exercises at home seated/standing and increase speed of head motions  Person educated: Patient Education method: Explanation and Demonstration Education comprehension: verbalized understanding and returned demonstration  HOME EXERCISE PROGRAM:  (Will need to update from previous bout of therapy)  GOALS: Goals reviewed with patient? Yes  SHORT TERM GOALS: Target date: = LTGs   LONG TERM GOALS: Target date: 12/152023   Pt will be independent with HEP for improved functional mobility and decreased dizziness. Baseline:  Goal status: INITIAL  2.  Pt will improve FGA score to at least 22/30 to decrease fall risk. Baseline: 19/30 Goal status: INITIAL  3.  Pt will improve FOTO score to at least 60 to demonstrate improved overall functional mobility. Baseline: 50 at eval Goal status: INITIAL   ASSESSMENT:  CLINICAL IMPRESSION: Skilled PT session today focused on review of HEP from previous bout of therapy this spring, with progression of HEP.  Most of those exercises from previous bout of therapy bring on dizziness symptoms/unsteadiness 2-3/10, but no overt LOB.  With narrowed stance, eyes  closed, head motions on Airex, pt is able to increase difficulty with exercises for HEP.  He will continue to benefit from skilled PT towards goals for improved balance.  OBJECTIVE IMPAIRMENTS: Abnormal gait, decreased balance, and dizziness.   ACTIVITY LIMITATIONS: bending, reach over head, and locomotion level  PARTICIPATION LIMITATIONS: community activity and outdoor fitness activities  PERSONAL FACTORS: Past/current experiences and 3+ comorbidities: See PMH above  are also affecting patient's functional outcome.   REHAB POTENTIAL: Good  CLINICAL DECISION MAKING: Stable/uncomplicated  EVALUATION COMPLEXITY: Low   PLAN:  PT FREQUENCY: 1-2x/week  PT DURATION: 4 weeks plus eval  PLANNED INTERVENTIONS: Therapeutic exercises, Therapeutic activity, Neuromuscular re-education, Balance training, Gait training, Patient/Family education, Self Care, Vestibular training, and Canalith repositioning  PLAN FOR NEXT SESSION: Review additions to HEP and continue to update as appropriate.  Work on gaze stabilization in standing, work on progressing compliant surfaces with head motions.  Work on gait, turns, activities for habituation.   Frazier Butt., PT 12/15/2021, 1:56 PM   [facility] at [facility] [place], [place], [place] [address] Phone # [phone] Fax # [phone]

## 2021-12-22 ENCOUNTER — Ambulatory Visit: Payer: Medicare Other | Attending: Otolaryngology | Admitting: Physical Therapy

## 2021-12-22 ENCOUNTER — Encounter: Payer: Self-pay | Admitting: Physical Therapy

## 2021-12-22 DIAGNOSIS — H8112 Benign paroxysmal vertigo, left ear: Secondary | ICD-10-CM | POA: Diagnosis present

## 2021-12-22 DIAGNOSIS — R2681 Unsteadiness on feet: Secondary | ICD-10-CM | POA: Diagnosis present

## 2021-12-22 DIAGNOSIS — R42 Dizziness and giddiness: Secondary | ICD-10-CM | POA: Diagnosis present

## 2021-12-22 NOTE — Therapy (Signed)
OUTPATIENT PHYSICAL THERAPY VESTIBULAR TREATMENT NOTES     Patient Name: Kevin [name] MRN: [id number] DOB:[birth year], 76 y.o., male Today's Date: 12/22/2021  END OF SESSION:  PT End of Session - 12/22/21 1445     Visit Number 3    Number of Visits 8    Date for PT Re-Evaluation 01/02/22    Authorization Type Medicare/AARP-may need to check KX as pt was seen earlier this year for PT (not KX as of 12/15/2021)    PT Start Time 1445    PT Stop Time 1530    PT Time Calculation (min) 45 min    Activity Tolerance Patient tolerated treatment well    Behavior During Therapy [facility] for tasks assessed/performed             Past Medical History:  Diagnosis Date   Arthritis    osteoarthritis-hips/ shoulders- no issues at present   Complication of anesthesia    longer to wake up   Foley catheter in place 05/30/2013   PT HAD SURG 05/30/13 - CYSTO AND CLOT EVACUATION BLADDER / PROSTATE - UNABLE TO VOID AFTER DISCHARGE - CAME BACK TO ER SAME NIGHT AND HAD FOLEY INSERTED TO DRAINAGE BAG - URINE WAS BLOODY BUT PT REPORTS ON 06/07/13 THAT URINE IN BAG MOSTLY CLEAR NOW.   GERD (gastroesophageal reflux disease)    History of kidney stones    x1   Hypertension    Muscle tension dysphonia    Past Surgical History:  Procedure Laterality Date   BACK SURGERY     lumbar    CYSTOSCOPY W/ URETEROSCOPY W/ LITHOTRIPSY     CYSTOSCOPY/RETROGRADE/URETEROSCOPY/STONE EXTRACTION WITH BASKET N/A 05/30/2013   Procedure: CYSTOSCOPY , BLADDER BIOPSY AND CLOT EVACUATION , CAUTERIZATIO OF LATERAL RIGHT AND LEFT LOBE OF PROSTATE;  Surgeon: Ailene Rud, MD;  Location: [facility];  Service: Urology;  Laterality: N/A;   HERNIA REPAIR     inguinal   INSERTION OF MESH N/A 11/24/2017   Procedure: INSERTION OF MESH;  Surgeon: Donnie Mesa, MD;  Location: [place];  Service: General;  Laterality: N/A;   JOINT REPLACEMENT     '00-left/'08 RTHA   KNEE ARTHROSCOPY     left knee meniscus surgery   PROSTATE SURGERY  Bilateral    7- 8 yrs ago   SHOULDER ARTHROSCOPY Right    SHOULDER ARTHROTOMY Left    TRANSURETHRAL RESECTION OF PROSTATE N/A 06/09/2013   Procedure: TRANSURETHRAL RESECTION OF THE PROSTATE (TURP) WITH GYRUS;  Surgeon: Ailene Rud, MD;  [facility];  Service: Urology;  Laterality: N/A;   UMBILICAL HERNIA REPAIR N/A 11/24/2017   Procedure: UMBILICAL HERNIA REPAIR WITH MESH;  Surgeon: Donnie Mesa, MD;  Location: [place];  Service: General;  Laterality: N/A;   Patient Active Problem List   Diagnosis Date Noted   Elevated coronary artery calcium score 10/28/2021   Hyperlipidemia 05/26/2021   Arthritis 12/18/2020   Palpitations 04/27/2017   Moderate aortic insufficiency 04/27/2017   Blurred vision, bilateral 05/31/2014   Essential hypertension 01/25/2014   Benign prostatic hypertrophy 06/09/2013    PCP: Lujean Amel, MD  REFERRING PROVIDER:   Raylene Miyamoto, MD    REFERRING DIAG:  R42 (ICD-10-CM) - Dizziness   THERAPY DIAG:  Unsteadiness on feet  Dizziness and giddiness  ONSET DATE:  11/19/2021 (MD referral)  Rationale for Evaluation and Treatment: Rehabilitation  SUBJECTIVE:   SUBJECTIVE STATEMENT: Have been doing well over the last few days, but today, I've had dizziness, lightheadedness, vertigo.  The vertigo is pretty much there with all movements-rates as 6-7/10.  Took a Meclizine today.   Pt accompanied by: self  PERTINENT HISTORY: He began having episodes of dizziness in 2020.  He describes it as both a spinning and lightheaded sensation.  Sometimes he may feel briefly confused for a moment.  Onset is acute and gradually dissipates.  (Per neurologist notes) *Per report 12/22/2021:  pt has blurred vision due to cataracts-is scheduled for R eye cataract surgery 01/20/2022 and L eye 02/03/2022  PAIN:  Are you having pain? No pain  PRECAUTIONS: Fall  WEIGHT BEARING RESTRICTIONS: No  FALLS: Has patient fallen in last 6 months? No  LIVING  ENVIRONMENT: Lives with: lives with their spouse Lives in: House/apartment Stairs: Yes: Internal: 2 steps; on right going up and External: 8>8 steps; on right going up and on left going up Has following equipment at home: None  PLOF: Independent, Independent with gait, and Leisure: yoga, stretch, tai chi, walks daily  PATIENT GOALS: "To lessen the vertigo and dizziness"  OBJECTIVE:   Reports feeling more off balance, especially with being up and moving around. TODAY'S TREATMENT: 12/22/2021 Activity Comments  See below for positional testing, based on c/o vertigo No nystagmus, no dizziness with positional testing  See Motion sensitivity rating Pt reports "this is a bad day"   Reviewed HEP -Romberg stance:  gaze stabilization horizontal and vertical; repeated 2nd rep with light UE support at chair -Romberg stance on foam with EC and head motions-cues for light UE support for improved stability  C/o mild unsteadiness.  Cues to use light UE support (pt not doing this at home and has increased sway and unsteadiness without UE support)  Short distance gait 20 ft x 4 reps Cues to fix on visual target (pt does report eyes blurry due to cataracts)  Figure-8 walking around cones, 2 reps  Mild unsteadiness and slowed pace, no overt LOB  Reviewed forward/back step and weightshift with head motions Return demo; cues to use light UE support         PATIENT EDUCATION: Education details: HEP additions; use of vision for balance (with pt's blurred vision and decreased vestibular system use for balance, this is likely affecting his balance) Person educated: Patient Education method: Explanation, Demonstration, and Handouts Education comprehension: verbalized understanding and returned demonstration  Access Code: [email or web address] URL: [email or web address] Date: 12/22/2021 Prepared by: [facility]  Program Notes Forward/back step with head motion up on  the forward step, head motion down on the step backwards.  Repeat 10 times on each leg.  Exercises - Gaze Stability (VOR) x1 Feet Together on Firm Ground With Horizontal Head Turns  - 1 x daily - 7 x weekly - 3 sets - 30 sec hold - Gaze Stability (VOR) x1 Feet Together on Firm Ground With Vertical Head Turns  - 1 x daily - 7 x weekly - 3 sets - 30 sec hold - Romberg Stance Eyes Closed on Foam Pad  - 1 x daily - 7 x weekly - 3 sets - 30 sec hold - Figure-8 Walking Around Cones  - 1 x daily - 7 x weekly - 1 sets - 3-5 reps    POSITIONAL TESTING:  Right Roll Test: no nystagmus, no dizziness; Duration: NA Left Roll Test: c/o blurriness, no nystagmus; Duration: NA Right Dix-Hallpike: no nystagmus, no dizziness; Duration:NA Left Dix-Hallpike: no nystagmus, c/o blurriness; Duration: NA    MOTION SENSITIVITY:    Motion  Sensitivity Quotient  Intensity: 0 = none, 1 = Lightheaded, 2 = Mild, 3 = Moderate, 4 = Severe, 5 = Vomiting  Intensity  1. Sitting to supine 0  2. Supine to L side 0  3. Supine to R side 0  4. Supine to sitting 0  5. L Hallpike-Dix 0  6. Up from L  0  7. R Hallpike-Dix 0  8. Up from R  0  9. Sitting, head  tipped to L knee 2  10. Head up from L  knee 3  11. Sitting, head  tipped to R knee 2  12. Head up from R  knee 3  13. Sitting head turns x5 1  (rates symptoms as 4/10)  14.Sitting head nods x5 2  (rates symptoms as 4/10)  15. In stance, 180  turn to L  3  16. In stance, 180  turn to R 3      TREATMENT: 12/15/2021 Activity Comments  Reviewed previous HEP: -gaze stabilization in standing horizontal and vertical -180 degree turns -gaze stabilization with gait -forward step on Airex -Romberg stance on Airex Brief onset of symptoms, but quickly subside  Airex: -EC feet apart head turns/nods x 10 -EC feet together head turns/nods x 10   Forward/back stepping with added vertical head motion x 10 Brings on slight unsteadiness; more unsteadiness with L as  stance leg  Romberg stance with gaze stabilization at target-horizontal and vertical; repeated with partial tandem   Tandem gait forward/back x 2 reps; tandem march 4 reps Supervision, no LOB; good ability to look ahead at target     -------------------------------------------------------------------------------------- Measures below from initial evaluation:  DIAGNOSTIC FINDINGS: NA for this episode of care  COGNITION: Overall cognitive status: Within functional limits for tasks assessed    Cervical ROM:  WFL  Active A/PROM (deg) eval  Flexion   Extension   Right lateral flexion   Left lateral flexion   Right rotation   Left rotation   (Blank rows = not tested)    FUNCTIONAL TESTS:  Functional gait assessment: 19/30 (declined from 25/30 from last bout of therapy, ending 03/04/2021)     PATIENT SURVEYS:  FOTO 50/goal is 60  VESTIBULAR ASSESSMENT:  GENERAL OBSERVATION: No acute distress.   SYMPTOM BEHAVIOR:  Subjective history: Pt reports "waviness", things look wavy when I look down.  Slight lightheadedness.  Has worsened over the past 4-6 weeks despite doing the previous therapy exercises.  Non-Vestibular symptoms:  has had high blood pressure, but under control; recent stopped Tramadol; upcoming cataract surgery in January for both eyes   Type of dizziness: Imbalance (Disequilibrium), Lightheadedness/Faint, and "waviness"  Frequency: "with me most of the time"  Duration: seconds-minutes  Aggravating factors: Induced by motion: looking up at the ceiling, bending down to the ground, turning body quickly, and turning head quickly  Relieving factors: head stationary  Progression of symptoms: unchanged  OCULOMOTOR EXAM:  Ocular Alignment: abnormal and L eye depressed  Ocular ROM: No Limitations  Spontaneous Nystagmus: absent  Gaze-Induced Nystagmus: absent  Smooth Pursuits: intact and c/o mild dizziness vertically  Saccades:  mild undershooting horizontal  directions, some catch up noted L eye with L saccades  Convergence/Divergence: 2-3 cm     VESTIBULAR - OCULAR REFLEX:   Slow VOR: Normal  VOR Cancellation: Normal  Head-Impulse Test: HIT Right: negative HIT Left: negative  Dynamic Visual Acuity:  NA   POSITIONAL TESTING: Right Dix-Hallpike: no nystagmus, no symptoms, "quick dizzy" upon sitting up, and Duration: NA  Left Dix-Hallpike: no nystagmus Right Roll Test: no nystagmus Left Roll Test: no nystagmus   M-CTSIB  Condition 1: Firm Surface, EO 30 Sec, Normal Sway  Condition 2: Firm Surface, EC 30 Sec, Normal Sway  Condition 3: Foam Surface, EO 30 Sec, Mild Sway  Condition 4: Foam Surface, EC 30 Sec, Mild Sway      OTHOSTATICS: not done    VESTIBULAR TREATMENT:                                                                                                   DATE: 12/04/2021   PATIENT EDUCATION: Education details: Eval results, POC; continue gaze stabilization exercises at home seated/standing and increase speed of head motions  Person educated: Patient Education method: Explanation and Demonstration Education comprehension: verbalized understanding and returned demonstration  HOME EXERCISE PROGRAM:  (Will need to update from previous bout of therapy)  GOALS: Goals reviewed with patient? Yes  SHORT TERM GOALS: Target date: = LTGs   LONG TERM GOALS: Target date: 12/152023   Pt will be independent with HEP for improved functional mobility and decreased dizziness. Baseline:  Goal status: INITIAL  2.  Pt will improve FGA score to at least 22/30 to decrease fall risk. Baseline: 19/30 Goal status: INITIAL  3.  Pt will improve FOTO score to at least 60 to demonstrate improved overall functional mobility. Baseline: 50 at eval Goal status: INITIAL   ASSESSMENT:  CLINICAL IMPRESSION: Pt presents to OPPT today with reports of several good days, but today being very dizzy, lightheaded and experiencing vertigo.   When asked about vertigo, he reports it is not spinning and it lasts for prolonged periods, more of "unbalanced, unsteadiness".  Assessed positional vertigo today, and no nystagmus or dizziness noted in any of the positions tested.  Focused more on vestibular system/balance training with varied foot positions and on compliant surfaces, which brings on his unsteadiness and increased trunk sway.  Discussed that with his blurred vision due to cataracts, this is likely impacting his balance, with his decreased vestibular system use for balance as well.  He will continue to benefit from skilled PT towards goals for improved balance.  OBJECTIVE IMPAIRMENTS: Abnormal gait, decreased balance, and dizziness.   ACTIVITY LIMITATIONS: bending, reach over head, and locomotion level  PARTICIPATION LIMITATIONS: community activity and outdoor fitness activities  PERSONAL FACTORS: Past/current experiences and 3+ comorbidities: See PMH above  are also affecting patient's functional outcome.   REHAB POTENTIAL: Good  CLINICAL DECISION MAKING: Stable/uncomplicated  EVALUATION COMPLEXITY: Low   PLAN:  PT FREQUENCY: 1-2x/week  PT DURATION: 4 weeks plus eval  PLANNED INTERVENTIONS: Therapeutic exercises, Therapeutic activity, Neuromuscular re-education, Balance training, Gait training, Patient/Family education, Self Care, Vestibular training, and Canalith repositioning  PLAN FOR NEXT SESSION: Review additions to HEP and continue to update as appropriate.  Work on gaze stabilization in standing, work on progressing compliant surfaces with head motions.  Work on gait, turns, activities for habituation.   Frazier Butt., PT 12/22/2021, 5:22 PM   [facility] at [facility] [place], [place], [place]  [place] Phone # [phone] Fax # [phone]

## 2021-12-23 NOTE — Therapy (Signed)
OUTPATIENT PHYSICAL THERAPY VESTIBULAR TREATMENT NOTES     Patient Name: Kevin [name] MRN: [id number] DOB:22-Oct-1945, 76 y.o., male Today's Date: 12/25/2021  END OF SESSION:  PT End of Session - 12/25/21 1230     Visit Number 4    Number of Visits 8    Date for PT Re-Evaluation 01/02/22    Authorization Type Medicare/[employer]ARP-may need to check KX as pt was seen earlier this year for PT (not KX as of 12/15/2021)    PT Start Time 1148    PT Stop Time 1228    PT Time Calculation (min) 40 min    Activity Tolerance Patient tolerated treatment well    Behavior During Therapy [facility] for tasks assessed/performed              Past Medical History:  Diagnosis Date   Arthritis    osteoarthritis-hips/ shoulders- no issues at present   Complication of anesthesia    longer to wake up   Foley catheter in place 05/30/2013   PT HAD SURG 05/30/13 - CYSTO AND CLOT EVACUATION BLADDER / PROSTATE - UNABLE TO VOID AFTER DISCHARGE - CAME BACK TO ER SAME NIGHT AND HAD FOLEY INSERTED TO DRAINAGE BAG - URINE WAS BLOODY BUT PT REPORTS ON 06/07/13 THAT URINE IN BAG MOSTLY CLEAR NOW.   GERD (gastroesophageal reflux disease)    History of kidney stones    x1   Hypertension    Muscle tension dysphonia    Past Surgical History:  Procedure Laterality Date   BACK SURGERY     lumbar    CYSTOSCOPY W/ URETEROSCOPY W/ LITHOTRIPSY     CYSTOSCOPY/RETROGRADE/URETEROSCOPY/STONE EXTRACTION WITH BASKET N/A 05/30/2013   Procedure: CYSTOSCOPY , BLADDER BIOPSY AND CLOT EVACUATION , CAUTERIZATIO OF LATERAL RIGHT AND LEFT LOBE OF PROSTATE;  Surgeon: Ailene Rud, MD;  Location: [facility];  Service: Urology;  Laterality: N/A;   HERNIA REPAIR     inguinal   INSERTION OF MESH N/A 11/24/2017   Procedure: INSERTION OF MESH;  Surgeon: Donnie Mesa, MD;  Location: [place];  Service: General;  Laterality: N/A;   JOINT REPLACEMENT     '00-left/'08 RTHA   KNEE ARTHROSCOPY     left knee meniscus surgery   PROSTATE SURGERY  Bilateral    7- 8 yrs ago   SHOULDER ARTHROSCOPY Right    SHOULDER ARTHROTOMY Left    TRANSURETHRAL RESECTION OF PROSTATE N/A 06/09/2013   Procedure: TRANSURETHRAL RESECTION OF THE PROSTATE (TURP) WITH GYRUS;  Surgeon: Ailene Rud, MD;  Location: [facility];  Service: Urology;  Laterality: N/A;   UMBILICAL HERNIA REPAIR N/A 11/24/2017   Procedure: UMBILICAL HERNIA REPAIR WITH MESH;  Surgeon: Donnie Mesa, MD;  Location: [place];  Service: General;  Laterality: N/A;   Patient Active Problem List   Diagnosis Date Noted   Elevated coronary artery calcium score 10/28/2021   Hyperlipidemia 05/26/2021   Arthritis 12/18/2020   Palpitations 04/27/2017   Moderate aortic insufficiency 04/27/2017   Blurred vision, bilateral 05/31/2014   Essential hypertension 01/25/2014   Benign prostatic hypertrophy 06/09/2013    PCP: Lujean Amel, MD  REFERRING PROVIDER:   Raylene Miyamoto, MD    REFERRING DIAG:  R42 (ICD-10-CM) - Dizziness   THERAPY DIAG:  Unsteadiness on feet  Dizziness and giddiness  BPPV (benign paroxysmal positional vertigo), left  ONSET DATE:  11/19/2021 (MD referral)  Rationale for Evaluation and Treatment: Rehabilitation  SUBJECTIVE:   SUBJECTIVE STATEMENT: Reports that dizziness is similar to a year ago.  Dr. Benjamine Mola did testing and believes that it is a problem with his crystals. Pt does not believe this is the case. Sensation is described as dizziness, brain fog, can't think straight. Reports BP has been good 130-133/60-65 mmHg. Pt is wondering that he may be over-medicated. Reports that symptoms increased after doubling his Tramadol dose as directed for LBP. Weaned off this medication in mid November- still having episodes.    Pt accompanied by: self  PERTINENT HISTORY: He began having episodes of dizziness in 2020.  He describes it as both a spinning and lightheaded sensation.  Sometimes he may feel briefly confused for a moment.  Onset is acute and gradually  dissipates.  (Per neurologist notes) *Per report 12/22/2021:  pt has blurred vision due to cataracts-is scheduled for R eye cataract surgery 01/20/2022 and L eye 02/03/2022  PAIN:  Are you having pain? No pain  PRECAUTIONS: Fall  WEIGHT BEARING RESTRICTIONS: No  FALLS: Has patient fallen in last 6 months? No  LIVING ENVIRONMENT: Lives with: lives with their spouse Lives in: House/apartment Stairs: Yes: Internal: 2 steps; on right going up and External: 8>8 steps; on right going up and on left going up Has following equipment at home: None  PLOF: Independent, Independent with gait, and Leisure: yoga, stretch, tai chi, walks daily  PATIENT GOALS: "To lessen the vertigo and dizziness"  OBJECTIVE:     TODAY'S TREATMENT: 12/25/21   Orthostatic Testing   Supine Sitting Standing  x1 Minute Standing x 3 Minutes  BP 147/71  143/68 *woozy  136/60 *woozy 137/65  HR 58 57 60 61        Activity Comments  R sidelying test C/o dizziness and very small amplitude upbeating R torsional nystagmus- inconsistent; worse dizziness upon sitting up   L sidelying test C/o dizziness and very small amplitude upbeating L torsional nystagmus- inconsistent; worse dizziness upon sitting up   R roll test A few beats R upbeating torsional nystagmus; dizziness throughout  L roll test dizziness throughout           Access Code: [email or web address] URL: [email or web address] Date: 12/25/2021 Prepared by: [facility]  Program Notes Forward/back step with head motion up on the forward step, head motion down on the step backwards.  Repeat 10 times on each leg.  Exercises - Gaze Stability (VOR) x1 Feet Together on Firm Ground With Horizontal Head Turns  - 1 x daily - 7 x weekly - 3 sets - 30 sec hold - Gaze Stability (VOR) x1 Feet Together on Firm Ground With Vertical Head Turns  - 1 x daily - 7 x weekly - 3 sets - 30 sec hold - Romberg Stance Eyes Closed on Foam  Pad  - 1 x daily - 7 x weekly - 3 sets - 30 sec hold - Figure-8 Walking Around [place]  - 1 x daily - 7 x weekly - 1 sets - 3-5 reps R/L brandt daroff 3-5x each side    PATIENT EDUCATION: Education details: thorough edu and discussion with pt on orthostatic testing and results, positional testing and results; all questions answered; HEP Person educated: Patient Education method: Explanation, Demonstration, Tactile cues, Verbal cues, and Handouts Education comprehension: verbalized understanding and returned demonstration    -------------------------------------------------------------------------------------- Measures below from initial evaluation:  DIAGNOSTIC FINDINGS: NA for this episode of care  COGNITION: Overall cognitive status: Within functional limits for tasks assessed    Cervical ROM:  WFL  Active A/PROM (deg) eval  Flexion   Extension   Right lateral flexion   Left lateral flexion   Right rotation   Left rotation   (Blank rows = not tested)    FUNCTIONAL TESTS:  Functional gait assessment: 19/30 (declined from 25/30 from last bout of therapy, ending 03/04/2021)     PATIENT SURVEYS:  FOTO 50/goal is 60  VESTIBULAR ASSESSMENT:  GENERAL OBSERVATION: No acute distress.   SYMPTOM BEHAVIOR:  Subjective history: Pt reports "waviness", things look wavy when I look down.  Slight lightheadedness.  Has worsened over the past 4-6 weeks despite doing the previous therapy exercises.  Non-Vestibular symptoms:  has had high blood pressure, but under control; recent stopped Tramadol; upcoming cataract surgery in January for both eyes   Type of dizziness: Imbalance (Disequilibrium), Lightheadedness/Faint, and "waviness"  Frequency: "with me most of the time"  Duration: seconds-minutes  Aggravating factors: Induced by motion: looking up at the ceiling, bending down to the ground, turning body quickly, and turning head quickly  Relieving factors: head  stationary  Progression of symptoms: unchanged  OCULOMOTOR EXAM:  Ocular Alignment: abnormal and L eye depressed  Ocular ROM: No Limitations  Spontaneous Nystagmus: absent  Gaze-Induced Nystagmus: absent  Smooth Pursuits: intact and c/o mild dizziness vertically  Saccades:  mild undershooting horizontal directions, some catch up noted L eye with L saccades  Convergence/Divergence: 2-3 cm     VESTIBULAR - OCULAR REFLEX:   Slow VOR: Normal  VOR Cancellation: Normal  Head-Impulse Test: HIT Right: negative HIT Left: negative  Dynamic Visual Acuity:  NA   POSITIONAL TESTING: Right Dix-Hallpike: no nystagmus, no symptoms, "quick dizzy" upon sitting up, and Duration: NA Left Dix-Hallpike: no nystagmus Right Roll Test: no nystagmus Left Roll Test: no nystagmus   M-CTSIB  Condition 1: Firm Surface, EO 30 Sec, Normal Sway  Condition 2: Firm Surface, EC 30 Sec, Normal Sway  Condition 3: Foam Surface, EO 30 Sec, Mild Sway  Condition 4: Foam Surface, EC 30 Sec, Mild Sway      OTHOSTATICS: not done    VESTIBULAR TREATMENT:                                                                                                   DATE: 12/04/2021   PATIENT EDUCATION: Education details: Eval results, POC; continue gaze stabilization exercises at home seated/standing and increase speed of head motions  Person educated: Patient Education method: Explanation and Demonstration Education comprehension: verbalized understanding and returned demonstration  HOME EXERCISE PROGRAM:  (Will need to update from previous bout of therapy)  GOALS: Goals reviewed with patient? Yes  SHORT TERM GOALS: Target date: = LTGs   LONG TERM GOALS: Target date: 12/152023   Pt will be independent with HEP for improved functional mobility and decreased dizziness. Baseline:  Goal status: IN PROGRESS  2.  Pt will improve FGA score to at least 22/30 to decrease fall risk. Baseline: 19/30 Goal status: IN  PROGRESS  3.  Pt will improve FOTO score to at least 60 to demonstrate improved overall functional mobility. Baseline: 50 at eval Goal status: IN PROGRESS  ASSESSMENT:  CLINICAL IMPRESSION: Patient arrived to session with report of continued dizziness and brain fog. Reports that ENT believes it is "a problem with the crystals" however patient wonders if he is over-medicated d/t being on several BP meds. Orthostatics were negative, however patient bradycardic throughout- advised him to discuss this with his MD. Positional testing revealed difficult to discern nystagmus with some increase in dizziness in all directions, particularly when sitting up. Provided habituation HEP and plan to trial CRM next session to see if sx improve.   OBJECTIVE IMPAIRMENTS: Abnormal gait, decreased balance, and dizziness.   ACTIVITY LIMITATIONS: bending, reach over head, and locomotion level  PARTICIPATION LIMITATIONS: community activity and outdoor fitness activities  PERSONAL FACTORS: Past/current experiences and 3+ comorbidities: See PMH above  are also affecting patient's functional outcome.   REHAB POTENTIAL: Good  CLINICAL DECISION MAKING: Stable/uncomplicated  EVALUATION COMPLEXITY: Low   PLAN:  PT FREQUENCY: 1-2x/week  PT DURATION: 4 weeks plus eval  PLANNED INTERVENTIONS: Therapeutic exercises, Therapeutic activity, Neuromuscular re-education, Balance training, Gait training, Patient/Family education, Self Care, Vestibular training, and Canalith repositioning  PLAN FOR NEXT SESSION: trial R or L epley to see if symptoms improve;  Review additions to HEP and continue to update as appropriate.  Work on gaze stabilization in standing, work on progressing compliant surfaces with head motions.  Work on gait, turns, activities for habituation.   Janene Harvey, PT, DPT 12/25/21 12:34 PM   Outpatient Rehab at [facility] [address] [place], [place], [place]  [address] Phone # [phone] Fax # [phone]

## 2021-12-25 ENCOUNTER — Encounter: Payer: Self-pay | Admitting: Physical Therapy

## 2021-12-25 ENCOUNTER — Ambulatory Visit: Payer: Medicare Other | Admitting: Physical Therapy

## 2021-12-25 DIAGNOSIS — R2681 Unsteadiness on feet: Secondary | ICD-10-CM

## 2021-12-25 DIAGNOSIS — H8112 Benign paroxysmal vertigo, left ear: Secondary | ICD-10-CM

## 2021-12-25 DIAGNOSIS — R42 Dizziness and giddiness: Secondary | ICD-10-CM

## 2021-12-29 ENCOUNTER — Ambulatory Visit: Payer: Medicare Other | Attending: Cardiology | Admitting: Pharmacist Clinician (PhC)/ Clinical Pharmacy Specialist

## 2021-12-29 ENCOUNTER — Encounter: Payer: Self-pay | Admitting: Pharmacist Clinician (PhC)/ Clinical Pharmacy Specialist

## 2021-12-29 DIAGNOSIS — E785 Hyperlipidemia, unspecified: Secondary | ICD-10-CM | POA: Diagnosis not present

## 2021-12-29 DIAGNOSIS — R931 Abnormal findings on diagnostic imaging of heart and coronary circulation: Secondary | ICD-10-CM

## 2021-12-29 NOTE — Assessment & Plan Note (Addendum)
Assessment:  LDL goal: <  70 mg/dl last LDLc 355  mg/dl (09/7414) Intolerance to low intensity  statins - myalgias, especially in hands/fingers Discussed next potential options (PCSK-9 inhibitors, bempedoic acid and inclisiran); mechanism of action, cost, dosing efficacy, side effects    Plan: Labs in the next week to determine starting point Based on results will consider ezetimibe vs Repatha vs inclisiran (Part G Medicare plan) Lipid lab due in 2-3 months after starting chosen treatment Work on lifestyle modifications including eating more fish, using only lean proteins or more plant based proteins.

## 2021-12-29 NOTE — Patient Instructions (Signed)
Your Results:             Your most recent labs (03/2021) Goal  Total Cholesterol 171 < 200  Triglycerides 171 < 150  HDL (happy/good cholesterol) 38 > 40  LDL (lousy/bad cholesterol 105 < 70   Lab orders:  Go to the lab sometime in the next few weeks to check cholesterol.   Once we get those results, we can determine which medication is our best option.      Patient Assistance:  The [employer] foundation offers assistance to help pay for medication copays.  They will cover copays for all cholesterol lowering meds, including statins, fibrates, omega-3 oils, ezetimibe, Repatha, Praluent, Nexletol, Nexlizet.  The cards are usually good for $2,500 or 12 months, whichever comes first. Go to healthwellfoundation.org Click on "Apply Now" Answer questions as to whom is applying (patient or representative) Your disease fund will be "hypercholesterolemia - Medicare access" Select the cholesterol medication you need assistance with (Repatha, Praluent, Nexlizet...) They will ask question about qualifying diagnosis - you can mark "yes"; and do you have insurance coverage.   When they ask what type of assistance you are interested in - "copay assistance" When you submit, the approval is usually within minutes.  You will need to print the card information from the site You will need to show this information to your pharmacy, they will bill your Medicare Part D plan first -then bill [employer] --for the copay.   You can also call them at [phone], although the hold times can be quite long.   Thank you for choosing [facility]

## 2021-12-29 NOTE — Therapy (Signed)
OUTPATIENT PHYSICAL THERAPY VESTIBULAR TREATMENT NOTES     Patient Name: Kevin [name] MRN: [id number] DOB:July 01, 1945, 76 y.o., male Today's Date: 12/30/2021  END OF SESSION:  PT End of Session - 12/30/21 1608     Visit Number 5    Number of Visits 8    Date for PT Re-Evaluation 01/02/22    Authorization Type Medicare/AARP-may need to check KX as pt was seen earlier this year for PT (not KX as of 12/15/2021)    PT Start Time 1527    PT Stop Time 1608    PT Time Calculation (min) 41 min    Equipment Utilized During Treatment Gait belt    Activity Tolerance Patient tolerated treatment well    Behavior During Therapy WFL for tasks assessed/performed               Past Medical History:  Diagnosis Date   Arthritis    osteoarthritis-hips/ shoulders- no issues at present   Complication of anesthesia    longer to wake up   Foley catheter in place 05/30/2013   PT HAD SURG 05/30/13 - CYSTO AND CLOT EVACUATION BLADDER / PROSTATE - UNABLE TO VOID AFTER DISCHARGE - CAME BACK TO ER SAME NIGHT AND HAD FOLEY INSERTED TO DRAINAGE BAG - URINE WAS BLOODY BUT PT REPORTS ON 06/07/13 THAT URINE IN BAG MOSTLY CLEAR NOW.   GERD (gastroesophageal reflux disease)    History of kidney stones    x1   Hypertension    Muscle tension dysphonia    Past Surgical History:  Procedure Laterality Date   BACK SURGERY     lumbar    CYSTOSCOPY W/ URETEROSCOPY W/ LITHOTRIPSY     CYSTOSCOPY/RETROGRADE/URETEROSCOPY/STONE EXTRACTION WITH BASKET N/A 05/30/2013   Procedure: CYSTOSCOPY , BLADDER BIOPSY AND CLOT EVACUATION , CAUTERIZATIO OF LATERAL RIGHT AND LEFT LOBE OF PROSTATE;  Surgeon: Ailene Rud, MD;  Location: [facility];  Service: Urology;  Laterality: N/A;   HERNIA REPAIR     inguinal   INSERTION OF MESH N/A 11/24/2017   Procedure: INSERTION OF MESH;  Surgeon: Donnie Mesa, MD;  Location: [facility];  Service: General;  Laterality: N/A;   JOINT REPLACEMENT     '00-left/'08 [facility]   KNEE  ARTHROSCOPY     left knee meniscus surgery   PROSTATE SURGERY Bilateral    7- 8 yrs ago   SHOULDER ARTHROSCOPY Right    SHOULDER ARTHROTOMY Left    TRANSURETHRAL RESECTION OF PROSTATE N/A 06/09/2013   Procedure: TRANSURETHRAL RESECTION OF THE PROSTATE (TURP) WITH GYRUS;  Surgeon: Ailene Rud, MD;  Location: [facility];  Service: Urology;  Laterality: N/A;   UMBILICAL HERNIA REPAIR N/A 11/24/2017   Procedure: UMBILICAL HERNIA REPAIR WITH MESH;  Surgeon: Donnie Mesa, MD;  Location: [facility];  Service: General;  Laterality: N/A;   Patient Active Problem List   Diagnosis Date Noted   Elevated coronary artery calcium score 10/28/2021   Hyperlipidemia 05/26/2021   Arthritis 12/18/2020   Palpitations 04/27/2017   Moderate aortic insufficiency 04/27/2017   Blurred vision, bilateral 05/31/2014   Essential hypertension 01/25/2014   Benign prostatic hypertrophy 06/09/2013    PCP: Lujean Amel, MD  REFERRING PROVIDER:   Raylene Miyamoto, MD    REFERRING DIAG:  R42 (ICD-10-CM) - Dizziness   THERAPY DIAG:  Unsteadiness on feet  BPPV (benign paroxysmal positional vertigo), left  Dizziness and giddiness  ONSET DATE:  11/19/2021 (MD referral)  Rationale for Evaluation and Treatment: Rehabilitation  SUBJECTIVE:   SUBJECTIVE  STATEMENT: Friday- now are much improved. However was having bad days before then. Feels like he has several bad days, then several good days. Standing up and horizontal motions bring on most symptoms. Feels that he can tell if he is having a good day or a bad day upon waking up. Reports 35-40 years ago he had migraines for 5-6 months, then disappeared and never came back. Rarely gets headaches. Denies head trauma, infection/illness, vision changes/double vision, hearing loss, tinnitus, otalgia, photo/phonophobia. Reports light sensitivity and some blurred vision d/t cataracts- has surgery scheduled.     Pt accompanied by: self  PERTINENT HISTORY: He began  having episodes of dizziness in 2020.  He describes it as both a spinning and lightheaded sensation.  Sometimes he may feel briefly confused for a moment.  Onset is acute and gradually dissipates.  (Per neurologist notes) *Per report 12/22/2021:  pt has blurred vision due to cataracts-is scheduled for R eye cataract surgery 01/20/2022 and L eye 02/03/2022  PAIN:  Are you having pain? No pain  PRECAUTIONS: Fall  WEIGHT BEARING RESTRICTIONS: No  FALLS: Has patient fallen in last 6 months? No  LIVING ENVIRONMENT: Lives with: lives with their spouse Lives in: House/apartment Stairs: Yes: Internal: 2 steps; on right going up and External: 8>8 steps; on right going up and on left going up Has following equipment at home: None  PLOF: Independent, Independent with gait, and Leisure: yoga, stretch, tai chi, walks daily  PATIENT GOALS: "To lessen the vertigo and dizziness"  OBJECTIVE:      TODAY'S TREATMENT: 12/30/21 Activity Comments  L DH Negative; no dizziness; mild wooziness upon sitting up  R DH Negative; no dizziness; mild wooziness upon sitting up  1/2 turns to targets + alt toe taps on cones C/o mild dizziness that went away quickly; good speed   Romberg EC+ head turns/nods  30" Mild-mod sway  medball D2 flexion to cone on stool 2x5  each side C/o mild- moderate dizziness, L>R  Gait + horizontal/vertical VOR 2x37f C/o mild-moderate dizziness with head turns, less so with nods     HOME EXERCISE PROGRAM Last updated: 12/30/21 Access Code: [email or web address]: [email or web address] Date: 12/30/2021 Prepared by: [facility] Notes Forward/back step with head motion up on the forward step, head motion down on the step backwards.  Repeat 10 times on each leg.  Exercises - Gaze Stability (VOR) x1 Feet Together on Firm Ground With Horizontal Head Turns  - 1 x daily - 7 x weekly - 3 sets - 30 sec hold - Romberg Stance Eyes Closed  on Foam Pad  - 1 x daily - 7 x weekly - 3 sets - 30 sec hold - Figure-8 Walking Around Cones  - 1 x daily - 7 x weekly - 1 sets - 3-5 reps - Standing Diagonal Chops with Medicine Ball  - 1 x daily - 5 x weekly - 2 sets - 5-10 reps - Walking Gaze Stabilization Head Rotation  - 1 x daily - 5 x weekly - 2 sets - 4-6 reps   PATIENT EDUCATION: Education details: discussion with patient on fluctuating and seemingly spontaentous nature of symptoms and possibility of patient having vestibular migraines d/t hx of remote migraines and without Has- advised patient he would need to see PCP or neurologist for formal diagnosis and migraine management; HEP update  Person educated: Patient Education method: Explanation, Demonstration, Tactile cues, Verbal cues, and Handouts Education comprehension: verbalized understanding and  returned demonstration      -------------------------------------------------------------------------------------- Measures below from initial evaluation:  DIAGNOSTIC FINDINGS: NA for this episode of care  COGNITION: Overall cognitive status: Within functional limits for tasks assessed    Cervical ROM:  WFL  Active A/PROM (deg) eval  Flexion   Extension   Right lateral flexion   Left lateral flexion   Right rotation   Left rotation   (Blank rows = not tested)    FUNCTIONAL TESTS:  Functional gait assessment: 19/30 (declined from 25/30 from last bout of therapy, ending 03/04/2021)     PATIENT SURVEYS:  FOTO 50/goal is 60  VESTIBULAR ASSESSMENT:  GENERAL OBSERVATION: No acute distress.   SYMPTOM BEHAVIOR:  Subjective history: Pt reports "waviness", things look wavy when I look down.  Slight lightheadedness.  Has worsened over the past 4-6 weeks despite doing the previous therapy exercises.  Non-Vestibular symptoms:  has had high blood pressure, but under control; recent stopped Tramadol; upcoming cataract surgery in January for both eyes   Type of dizziness:  Imbalance (Disequilibrium), Lightheadedness/Faint, and "waviness"  Frequency: "with me most of the time"  Duration: seconds-minutes  Aggravating factors: Induced by motion: looking up at the ceiling, bending down to the ground, turning body quickly, and turning head quickly  Relieving factors: head stationary  Progression of symptoms: unchanged  OCULOMOTOR EXAM:  Ocular Alignment: abnormal and L eye depressed  Ocular ROM: No Limitations  Spontaneous Nystagmus: absent  Gaze-Induced Nystagmus: absent  Smooth Pursuits: intact and c/o mild dizziness vertically  Saccades:  mild undershooting horizontal directions, some catch up noted L eye with L saccades  Convergence/Divergence: 2-3 cm     VESTIBULAR - OCULAR REFLEX:   Slow VOR: Normal  VOR Cancellation: Normal  Head-Impulse Test: HIT Right: negative HIT Left: negative  Dynamic Visual Acuity:  NA   POSITIONAL TESTING: Right Dix-Hallpike: no nystagmus, no symptoms, "quick dizzy" upon sitting up, and Duration: NA Left Dix-Hallpike: no nystagmus Right Roll Test: no nystagmus Left Roll Test: no nystagmus   M-CTSIB  Condition 1: Firm Surface, EO 30 Sec, Normal Sway  Condition 2: Firm Surface, EC 30 Sec, Normal Sway  Condition 3: Foam Surface, EO 30 Sec, Mild Sway  Condition 4: Foam Surface, EC 30 Sec, Mild Sway      OTHOSTATICS: not done    VESTIBULAR TREATMENT:                                                                                                   DATE: 12/04/2021   PATIENT EDUCATION: Education details: Eval results, POC; continue gaze stabilization exercises at home seated/standing and increase speed of head motions  Person educated: Patient Education method: Explanation and Demonstration Education comprehension: verbalized understanding and returned demonstration  HOME EXERCISE PROGRAM:  (Will need to update from previous bout of therapy)  GOALS: Goals reviewed with patient? Yes  SHORT TERM GOALS: Target  date: = LTGs   LONG TERM GOALS: Target date: 12/152023   Pt will be independent with HEP for improved functional mobility and decreased dizziness. Baseline:  Goal status: IN PROGRESS  2.  Pt will improve FGA score to at least 22/30 to decrease fall risk. Baseline: 19/30 Goal status: IN PROGRESS  3.  Pt will improve FOTO score to at least 60 to demonstrate improved overall functional mobility. Baseline: 50 at eval Goal status: IN PROGRESS   ASSESSMENT:  CLINICAL IMPRESSION: Patient arrived to session with report of improvement in symptoms since Friday without known cause. Reports that his symptoms fluctuate without particular cause for days at a time. Reports remote hx of migraines that have not occurred in 35-40 years. Denies head trauma, infection/illness, vision changes/double vision, hearing loss, tinnitus, otalgia, photo/phonophobia. Reports light sensitivity and some blurred vision d/t cataracts- has surgery scheduled. Positional testing today was negative- no need for CRM. Proceeding with gaze stabilization and habituation activities. Mild-moderate dizziness was brought on with head turns and bending activities. Discussed possibility of vestibular migraine as cause of his sx- advised patient that he would need to see MD for formal diagnosis.  Patient reported understanding of all edu provided and without complaints at end of session.    OBJECTIVE IMPAIRMENTS: Abnormal gait, decreased balance, and dizziness.   ACTIVITY LIMITATIONS: bending, reach over head, and locomotion level  PARTICIPATION LIMITATIONS: community activity and outdoor fitness activities  PERSONAL FACTORS: Past/current experiences and 3+ comorbidities: See PMH above  are also affecting patient's functional outcome.   REHAB POTENTIAL: Good  CLINICAL DECISION MAKING: Stable/uncomplicated  EVALUATION COMPLEXITY: Low   PLAN:  PT FREQUENCY: 1-2x/week  PT DURATION: 4 weeks plus eval  PLANNED INTERVENTIONS:  Therapeutic exercises, Therapeutic activity, Neuromuscular re-education, Balance training, Gait training, Patient/Family education, Self Care, Vestibular training, and Canalith repositioning  PLAN FOR NEXT SESSION: check LTGs and decide recert vs. DC- pt may benefit from f/u with PCP to look into vestibular migraines as possible cause of sx. Review additions to HEP and continue to update as appropriate.  Work on gaze stabilization in standing, work on progressing compliant surfaces with head motions.  Work on gait, turns, activities for habituation.   Janene Harvey, PT, DPT 12/30/21 4:09 PM  [facility] at [facility] [address] [place], [place], [place] [address] Phone # [phone] Fax # [phone]

## 2021-12-29 NOTE — Progress Notes (Unsigned)
Office Visit    Patient Name: Kevin [name] Date of Encounter: 12/29/2021  Primary Care Provider:  Darrow Bussing, MD Primary Cardiologist:  Nanetta Batty, MD  Chief Complaint    Hyperlipidemia   Significant Past Medical History   hypertension With strong white coat component - on valsartan, amlodipine, chlorthalidone, hydralazine and spironolactone  CAD Coronary calcium score 537  anxiety Using ashwaganda - feels it is helping     Allergies  Allergen Reactions   Crestor [Rosuvastatin]     Myalgias   Zoloft [Sertraline]     Panic attacks    History of Present Illness    Kevin [name] is a 76 y.o. male patient of Dr Allyson Sabal in the office to discuss options for cholesterol lowering.  A coronary CT back I 2019 showed his calcium score to be at 537. Today he notes feeling well overall.    Insurance Carrier:  [employer] G  LDL Cholesterol goal:  LDL < 70  Current Medications:   none  Previously tried:  atorvastatin, rosuvastatin - myalgias  Family Hx:  father had hypertension for many years, died after MI at age 72.  No cardiac history in mother.  One sister with hypertension.   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  has started limiting wine to 1 glass/day   Diet: mostly home cooked from scratch, no added salt;  Exercise: walking most days, hiking when able/weather permitting. Still some vertigo/imbalance issues     Accessory Clinical Findings   In [facility]:  3/23:  TC 171, TG 171, HDL 38, LDL 105  Lab Results  Component Value Date   ALT 32 12/02/2020   AST 27 12/02/2020   ALKPHOS 46 12/02/2020   BILITOT 1.0 12/02/2020   Lab Results  Component Value Date   CREATININE 1.10 05/01/2021   BUN 23 05/01/2021   NA 142 05/01/2021   K 4.1 05/01/2021   CL 103 05/01/2021   CO2 24 05/01/2021   No results found for: "HGBA1C"  Home Medications    Current Outpatient Medications  Medication Sig Dispense Refill    amLODipine (NORVASC) 5 MG tablet TAKE 1 TABLET(5 MG) BY MOUTH DAILY 30 tablet 6   amoxicillin (AMOXIL) 500 MG capsule Before dental procedures     ASHWAGANDHA PO Take 1 capsule by mouth in the morning and at bedtime.     azelastine (ASTELIN) 0.1 % nasal spray Place 1 spray into both nostrils as needed.     chlorthalidone (HYGROTON) 25 MG tablet TAKE 1 TABLET BY MOUTH EVERY DAY 90 tablet 2   hydrALAZINE (APRESOLINE) 50 MG tablet Take 1 tablet (50 mg total) by mouth in the morning and at bedtime. 180 tablet 3   ipratropium (ATROVENT) 0.06 % nasal spray Place 1 spray into the nose as needed.     ketoconazole (NIZORAL) 2 % shampoo Apply 1 application topically as needed.     latanoprost (XALATAN) 0.005 % ophthalmic solution Place 1 drop into both eyes at bedtime.     meloxicam (MOBIC) 7.5 MG tablet Take 7.5 mg by mouth daily as needed.     Methylsulfonylmethane 1000 MG CAPS Take 2,000 mg by mouth daily.     Misc Natural Products (TART CHERRY ADVANCED PO) Take 2 capsules by mouth daily.     omeprazole (PRILOSEC) 40 MG capsule Take 40 mg by mouth daily.     spironolactone (ALDACTONE) 25 MG tablet Take 1 tablet (25 mg total) by mouth  daily. 90 tablet 3   SYSTANE ULTRA 0.4-0.3 % SOLN Apply 1 drop to eye 3 (three) times daily.     TURMERIC PO Take 2 capsules by mouth daily.     valsartan (DIOVAN) 320 MG tablet TAKE 1 TABLET(320 MG) BY MOUTH DAILY 90 tablet 3   White Petrolatum-Mineral Oil (SYSTANE NIGHTTIME) OINT      No current facility-administered medications for this visit.     Assessment & Plan    Hyperlipidemia Assessment:  LDL goal: <  70 mg/dl last LDLc 388  mg/dl (08/2798) Intolerance to low intensity  statins - myalgias, especially in hands/fingers Discussed next potential options (PCSK-9 inhibitors, bempedoic acid and inclisiran); mechanism of action, cost, dosing efficacy, side effects    Plan: Labs in the next week to determine starting point Based on results will consider  ezetimibe vs Repatha vs inclisiran (Part G Medicare plan) Lipid lab due in 2-3 months after starting chosen treatment Work on lifestyle modifications including eating more fish, using only lean proteins or more plant based proteins.      [name], PharmD CPP [facility] at [facility] [address] [place], [place] [address] 12/29/2021, 5:05 PM

## 2021-12-30 ENCOUNTER — Encounter: Payer: Self-pay | Admitting: Physical Therapy

## 2021-12-30 ENCOUNTER — Encounter: Payer: Self-pay | Admitting: Pharmacist Clinician (PhC)/ Clinical Pharmacy Specialist

## 2021-12-30 ENCOUNTER — Ambulatory Visit: Payer: Medicare Other | Admitting: Physical Therapy

## 2021-12-30 DIAGNOSIS — H8112 Benign paroxysmal vertigo, left ear: Secondary | ICD-10-CM

## 2021-12-30 DIAGNOSIS — R2681 Unsteadiness on feet: Secondary | ICD-10-CM

## 2021-12-30 DIAGNOSIS — R42 Dizziness and giddiness: Secondary | ICD-10-CM

## 2022-01-01 ENCOUNTER — Ambulatory Visit: Payer: Medicare Other | Admitting: Physical Therapy

## 2022-01-01 ENCOUNTER — Encounter: Payer: Self-pay | Admitting: Physical Therapy

## 2022-01-01 DIAGNOSIS — R2681 Unsteadiness on feet: Secondary | ICD-10-CM | POA: Diagnosis not present

## 2022-01-01 NOTE — Therapy (Signed)
OUTPATIENT PHYSICAL THERAPY VESTIBULAR TREATMENT NOTES/RECERT     Patient Name: Kevin [name] MRN: [id number] DOB:[birth year], 76 y.o., male Today's Date: 12/30/2021  END OF SESSION:   PT End of Session - 01/01/22 1106     Visit Number 6    Number of Visits 10    Date for PT Re-Evaluation 01/30/22    Authorization Type Medicare/AARP-may need to check KX as pt was seen earlier this year for PT (not KX as of 12/15/2021)    PT Start Time 1105    PT Stop Time 1146    PT Time Calculation (min) 41 min    Equipment Utilized During Treatment Gait belt    Activity Tolerance Patient tolerated treatment well    Behavior During Therapy WFL for tasks assessed/performed                    Past Medical History:  Diagnosis Date   Arthritis    osteoarthritis-hips/ shoulders- no issues at present   Complication of anesthesia    longer to wake up   Foley catheter in place 05/30/2013   PT HAD SURG 05/30/13 - CYSTO AND CLOT EVACUATION BLADDER / PROSTATE - UNABLE TO VOID AFTER DISCHARGE - CAME BACK TO ER SAME NIGHT AND HAD FOLEY INSERTED TO DRAINAGE BAG - URINE WAS BLOODY BUT PT REPORTS ON 06/07/13 THAT URINE IN BAG MOSTLY CLEAR NOW.   GERD (gastroesophageal reflux disease)    History of kidney stones    x1   Hypertension    Muscle tension dysphonia    Past Surgical History:  Procedure Laterality Date   BACK SURGERY     lumbar    CYSTOSCOPY W/ URETEROSCOPY W/ LITHOTRIPSY     CYSTOSCOPY/RETROGRADE/URETEROSCOPY/STONE EXTRACTION WITH BASKET N/A 05/30/2013   Procedure: CYSTOSCOPY , BLADDER BIOPSY AND CLOT EVACUATION , CAUTERIZATIO OF LATERAL RIGHT AND LEFT LOBE OF PROSTATE;  Surgeon: Ailene Rud, MD;  Location: [facility];  Service: Urology;  Laterality: N/A;   HERNIA REPAIR     inguinal   INSERTION OF MESH N/A 11/24/2017   Procedure: INSERTION OF MESH;  Surgeon: Donnie Mesa, MD;  Location: [place];  Service: General;  Laterality: N/A;   JOINT REPLACEMENT     '00-left/'08 RTHA    KNEE ARTHROSCOPY     left knee meniscus surgery   PROSTATE SURGERY Bilateral    7- 8 yrs ago   SHOULDER ARTHROSCOPY Right    SHOULDER ARTHROTOMY Left    TRANSURETHRAL RESECTION OF PROSTATE N/A 06/09/2013   Procedure: TRANSURETHRAL RESECTION OF THE PROSTATE (TURP) WITH GYRUS;  Surgeon: Ailene Rud, MD;  Location: [facility];  Service: Urology;  Laterality: N/A;   UMBILICAL HERNIA REPAIR N/A 11/24/2017   Procedure: UMBILICAL HERNIA REPAIR WITH MESH;  Surgeon: Donnie Mesa, MD;  Location: [place];  Service: General;  Laterality: N/A;   Patient Active Problem List   Diagnosis Date Noted   Elevated coronary artery calcium score 10/28/2021   Hyperlipidemia 05/26/2021   Arthritis 12/18/2020   Palpitations 04/27/2017   Moderate aortic insufficiency 04/27/2017   Blurred vision, bilateral 05/31/2014   Essential hypertension 01/25/2014   Benign prostatic hypertrophy 06/09/2013    PCP: Lujean Amel, MD  REFERRING PROVIDER:   Raylene Miyamoto, MD    REFERRING DIAG:  R42 (ICD-10-CM) - Dizziness   THERAPY DIAG:  Unsteadiness on feet  BPPV (benign paroxysmal positional vertigo), left  Dizziness and giddiness  ONSET DATE:  11/19/2021 (MD referral)  Rationale for Evaluation and Treatment:  Rehabilitation  SUBJECTIVE:   SUBJECTIVE STATEMENT: Have had a run of good days.  The exercises that are challenging seem to be helping.  Symptoms today are very, very minor.  Pt accompanied by: self  PERTINENT HISTORY: He began having episodes of dizziness in 2020.  He describes it as both a spinning and lightheaded sensation.  Sometimes he may feel briefly confused for a moment.  Onset is acute and gradually dissipates.  (Per neurologist notes) *Per report 12/22/2021:  pt has blurred vision due to cataracts-is scheduled for R eye cataract surgery 01/20/2022 and L eye 02/03/2022  PAIN:  Are you having pain? No pain  PRECAUTIONS: Fall  WEIGHT BEARING RESTRICTIONS: No  FALLS: Has patient  fallen in last 6 months? No  LIVING ENVIRONMENT: Lives with: lives with their spouse Lives in: House/apartment Stairs: Yes: Internal: 2 steps; on right going up and External: 8>8 steps; on right going up and on left going up Has following equipment at home: None  PLOF: Independent, Independent with gait, and Leisure: yoga, stretch, tai chi, walks daily  PATIENT GOALS: "To lessen the vertigo and dizziness"  OBJECTIVE:     TODAY'S TREATMENT: 01/01/2022 Activity Comments  Review of HEP additions last visit: -diagonal squats/reaches -Figure-8 walking -walking gaze stabilization -tandem gait forward/back Only minimal unsteadiness symptoms, no overt LOB  MiniBEST 25/28 improved from 19/28 at eval  FOTO:  58 Improved from 50; predicted score 60  Corner balance: -Feet together EC head turns/head nods  -Feet apart EO diagonal head motions -Feet apart trunk rotation x 5 reps Increased unsteadiness with EC head nods    -mild unsteadiness  Tandem gait forward/back at counter, progress to tandem march No UE support, cues to look ahead at target; slowed pace with tandem march        M-CTSIB  Condition 1: Firm Surface, EO 30 Sec, Normal Sway  Condition 2: Firm Surface, EC 30 Sec, Normal Sway  Condition 3: Foam Surface, EO 30 Sec, Normal Sway  Condition 4: Foam Surface, EC 30 Sec, Mild Sway     PATIENT EDUCATION: Education details: Progress towards goals, POC Person educated: Patient Education method: Explanation Education comprehension: verbalized understanding    TREATMENT: 12/30/21 Activity Comments  L DH Negative; no dizziness; mild wooziness upon sitting up  R DH Negative; no dizziness; mild wooziness upon sitting up  1/2 turns to targets + alt toe taps on cones C/o mild dizziness that went away quickly; good speed   Romberg EC+ head turns/nods  30" Mild-mod sway  medball D2 flexion to cone on stool 2x5  each side C/o mild- moderate dizziness, L>R  Gait +  horizontal/vertical VOR 2x74f C/o mild-moderate dizziness with head turns, less so with nods     HOME EXERCISE PROGRAM Last updated: 12/30/21 Access Code: [email or web address]: [email or web address] Date: 12/30/2021 Prepared by: [facility] Program Notes Forward/back step with head motion up on the forward step, head motion down on the step backwards.  Repeat 10 times on each leg.  Exercises - Gaze Stability (VOR) x1 Feet Together on Firm Ground With Horizontal Head Turns  - 1 x daily - 7 x weekly - 3 sets - 30 sec hold - Romberg Stance Eyes Closed on Foam Pad  - 1 x daily - 7 x weekly - 3 sets - 30 sec hold - Figure-8 Walking Around Cones  - 1 x daily - 7 x weekly - 1 sets - 3-5 reps - Standing Diagonal  Chops with Medicine Ball  - 1 x daily - 5 x weekly - 2 sets - 5-10 reps - Walking Gaze Stabilization Head Rotation  - 1 x daily - 5 x weekly - 2 sets - 4-6 reps       -------------------------------------------------------------------------------------- Measures below from initial evaluation:  DIAGNOSTIC FINDINGS: NA for this episode of care  COGNITION: Overall cognitive status: Within functional limits for tasks assessed    Cervical ROM:  WFL  Active A/PROM (deg) eval  Flexion   Extension   Right lateral flexion   Left lateral flexion   Right rotation   Left rotation   (Blank rows = not tested)    FUNCTIONAL TESTS:  Functional gait assessment: 19/30 (declined from 25/30 from last bout of therapy, ending 03/04/2021)     PATIENT SURVEYS:  FOTO 50/goal is 60  VESTIBULAR ASSESSMENT:  GENERAL OBSERVATION: No acute distress.   SYMPTOM BEHAVIOR:  Subjective history: Pt reports "waviness", things look wavy when I look down.  Slight lightheadedness.  Has worsened over the past 4-6 weeks despite doing the previous therapy exercises.  Non-Vestibular symptoms:  has had high blood pressure, but under control; recent  stopped Tramadol; upcoming cataract surgery in January for both eyes   Type of dizziness: Imbalance (Disequilibrium), Lightheadedness/Faint, and "waviness"  Frequency: "with me most of the time"  Duration: seconds-minutes  Aggravating factors: Induced by motion: looking up at the ceiling, bending down to the ground, turning body quickly, and turning head quickly  Relieving factors: head stationary  Progression of symptoms: unchanged  OCULOMOTOR EXAM:  Ocular Alignment: abnormal and L eye depressed  Ocular ROM: No Limitations  Spontaneous Nystagmus: absent  Gaze-Induced Nystagmus: absent  Smooth Pursuits: intact and c/o mild dizziness vertically  Saccades:  mild undershooting horizontal directions, some catch up noted L eye with L saccades  Convergence/Divergence: 2-3 cm     VESTIBULAR - OCULAR REFLEX:   Slow VOR: Normal  VOR Cancellation: Normal  Head-Impulse Test: HIT Right: negative HIT Left: negative  Dynamic Visual Acuity:  NA   POSITIONAL TESTING: Right Dix-Hallpike: no nystagmus, no symptoms, "quick dizzy" upon sitting up, and Duration: NA Left Dix-Hallpike: no nystagmus Right Roll Test: no nystagmus Left Roll Test: no nystagmus   M-CTSIB  Condition 1: Firm Surface, EO 30 Sec, Normal Sway  Condition 2: Firm Surface, EC 30 Sec, Normal Sway  Condition 3: Foam Surface, EO 30 Sec, Mild Sway  Condition 4: Foam Surface, EC 30 Sec, Mild Sway      OTHOSTATICS: not done    VESTIBULAR TREATMENT:                                                                                                   DATE: 12/04/2021   PATIENT EDUCATION: Education details: Eval results, POC; continue gaze stabilization exercises at home seated/standing and increase speed of head motions  Person educated: Patient Education method: Explanation and Demonstration Education comprehension: verbalized understanding and returned demonstration  HOME EXERCISE PROGRAM:  (Will need to update from  previous bout of therapy)  GOALS: Goals reviewed with patient? Yes  SHORT TERM GOALS: Target date: = LTGs   LONG TERM GOALS: Target date: 12/152023>UPDATED TARGET 01/30/2022   Pt will be independent with progression of HEP for improved functional mobility and decreased dizziness. Baseline:  Goal status: GOAL MET/REVISED , 01/01/2022  2.  Pt will improve FGA score to at least 22/30 to decrease fall risk. Baseline: 19/30>25/30 01/01/2022 Goal status: GOAL MET  3.  Pt will improve FOTO score to at least 60 to demonstrate improved overall functional mobility. Baseline: 50 at eval>58/60 Goal status: IN PROGRESS   ASSESSMENT:  CLINICAL IMPRESSION: Skilled PT session today looking at [place].  Pt is progressing well with therapy, having had 2 sessions with reported improvements in symptoms.  He feels the progression of HEP difficulty has been helping.  He has met 2 of 3 LTGs.  He has improved FGA score from 19/30 to 25/30.  He is progressing with FOTO score.  He does continue to have increased unsteadiness with narrowed BOS on compliant surfaces with vision removed and head motions.  He would benefit from continued progression of HEP to address high level balance for improved overall functional mobility.   OBJECTIVE IMPAIRMENTS: Abnormal gait, decreased balance, and dizziness.   ACTIVITY LIMITATIONS: bending, reach over head, and locomotion level  PARTICIPATION LIMITATIONS: community activity and outdoor fitness activities  PERSONAL FACTORS: Past/current experiences and 3+ comorbidities: See PMH above  are also affecting patient's functional outcome.   REHAB POTENTIAL: Good  CLINICAL DECISION MAKING: Stable/uncomplicated  EVALUATION COMPLEXITY: Low   PLAN:  PT FREQUENCY: 1x/wk   PT DURATION: 4 weeks (per recert 31/51/7616)  PLANNED INTERVENTIONS: Therapeutic exercises, Therapeutic activity, Neuromuscular re-education, Balance training, Gait training, Patient/Family education,  Self Care, Vestibular training, and Canalith repositioning  PLAN FOR NEXT SESSION: Recert completed today.  Pt may benefit from f/u with PCP to look into vestibular migraines as possible cause of sx. Continue to update/progress HEP as appropriate.  Work on gaze stabilization in standing, work on progressing compliant surfaces with head motions.  Work on gait, turns, activities for habituation.   Mady Haagensen, PT 01/01/22 12:55 PM Phone: [phone] Fax: [phone]   [facility] at [facility] [place], [place], [place] [address] Phone # [phone] Fax # [phone]

## 2022-01-02 ENCOUNTER — Other Ambulatory Visit: Payer: Self-pay

## 2022-01-02 DIAGNOSIS — E785 Hyperlipidemia, unspecified: Secondary | ICD-10-CM

## 2022-01-07 ENCOUNTER — Ambulatory Visit: Payer: Medicare Other | Admitting: Physical Therapy

## 2022-01-07 ENCOUNTER — Encounter: Payer: Self-pay | Admitting: Physical Therapy

## 2022-01-07 DIAGNOSIS — R2681 Unsteadiness on feet: Secondary | ICD-10-CM

## 2022-01-07 DIAGNOSIS — R42 Dizziness and giddiness: Secondary | ICD-10-CM

## 2022-01-07 NOTE — Therapy (Signed)
OUTPATIENT PHYSICAL THERAPY VESTIBULAR TREATMENT NOTES     Patient Name: Kevin [name] MRN: [id number] DOB:[birth year], 76 y.o., male Today's Date: 12/30/2021  END OF SESSION:   PT End of Session - 01/07/22 1445     Visit Number 7    Number of Visits 10    Date for PT Re-Evaluation 01/30/22    Authorization Type Medicare/AARP- KX as of 01/07/2022    PT Start Time 1446    PT Stop Time 1526    PT Time Calculation (min) 40 min    Equipment Utilized During Treatment Gait belt    Activity Tolerance Patient tolerated treatment well    Behavior During Therapy WFL for tasks assessed/performed                    Past Medical History:  Diagnosis Date   Arthritis    osteoarthritis-hips/ shoulders- no issues at present   Complication of anesthesia    longer to wake up   Foley catheter in place 05/30/2013   PT HAD SURG 05/30/13 - CYSTO AND CLOT EVACUATION BLADDER / PROSTATE - UNABLE TO VOID AFTER DISCHARGE - CAME BACK TO ER SAME NIGHT AND HAD FOLEY INSERTED TO DRAINAGE BAG - URINE WAS BLOODY BUT PT REPORTS ON 06/07/13 THAT URINE IN BAG MOSTLY CLEAR NOW.   GERD (gastroesophageal reflux disease)    History of kidney stones    x1   Hypertension    Muscle tension dysphonia    Past Surgical History:  Procedure Laterality Date   BACK SURGERY     lumbar    CYSTOSCOPY W/ URETEROSCOPY W/ LITHOTRIPSY     CYSTOSCOPY/RETROGRADE/URETEROSCOPY/STONE EXTRACTION WITH BASKET N/A 05/30/2013   Procedure: CYSTOSCOPY , BLADDER BIOPSY AND CLOT EVACUATION , CAUTERIZATIO OF LATERAL RIGHT AND LEFT LOBE OF PROSTATE;  Surgeon: Ailene Rud, MD;  Location: [facility];  Service: Urology;  Laterality: N/A;   HERNIA REPAIR     inguinal   INSERTION OF MESH N/A 11/24/2017   Procedure: INSERTION OF MESH;  Surgeon: Donnie Mesa, MD;  Location: [place];  Service: General;  Laterality: N/A;   JOINT REPLACEMENT     '00-left/'08 RTHA   KNEE ARTHROSCOPY     left knee meniscus surgery   PROSTATE SURGERY  Bilateral    7- 8 yrs ago   SHOULDER ARTHROSCOPY Right    SHOULDER ARTHROTOMY Left    TRANSURETHRAL RESECTION OF PROSTATE N/A 06/09/2013   Procedure: TRANSURETHRAL RESECTION OF THE PROSTATE (TURP) WITH GYRUS;  Surgeon: Ailene Rud, MD;  Location: [facility];  Service: Urology;  Laterality: N/A;   UMBILICAL HERNIA REPAIR N/A 11/24/2017   Procedure: UMBILICAL HERNIA REPAIR WITH MESH;  Surgeon: Donnie Mesa, MD;  Location: [place];  Service: General;  Laterality: N/A;   Patient Active Problem List   Diagnosis Date Noted   Elevated coronary artery calcium score 10/28/2021   Hyperlipidemia 05/26/2021   Arthritis 12/18/2020   Palpitations 04/27/2017   Moderate aortic insufficiency 04/27/2017   Blurred vision, bilateral 05/31/2014   Essential hypertension 01/25/2014   Benign prostatic hypertrophy 06/09/2013    PCP: Lujean Amel, MD  REFERRING PROVIDER:   Raylene Miyamoto, MD    REFERRING DIAG:  R42 (ICD-10-CM) - Dizziness   THERAPY DIAG:  Unsteadiness on feet  BPPV (benign paroxysmal positional vertigo), left  Dizziness and giddiness  ONSET DATE:  11/19/2021 (MD referral)  Rationale for Evaluation and Treatment: Rehabilitation  SUBJECTIVE:   SUBJECTIVE STATEMENT: More good days than bad days.  The bad days are not as bad and the good days are better.    Pt accompanied by: self  PERTINENT HISTORY: He began having episodes of dizziness in 2020.  He describes it as both a spinning and lightheaded sensation.  Sometimes he may feel briefly confused for a moment.  Onset is acute and gradually dissipates.  (Per neurologist notes) *Per report 12/22/2021:  pt has blurred vision due to cataracts-is scheduled for R eye cataract surgery 01/20/2022 and L eye 02/03/2022  PAIN:  Are you having pain? No pain  PRECAUTIONS: Fall  WEIGHT BEARING RESTRICTIONS: No  FALLS: Has patient fallen in last 6 months? No  LIVING ENVIRONMENT: Lives with: lives with their spouse Lives in:  House/apartment Stairs: Yes: Internal: 2 steps; on right going up and External: 8>8 steps; on right going up and on left going up Has following equipment at home: None  PLOF: Independent, Independent with gait, and Leisure: yoga, stretch, tai chi, walks daily  PATIENT GOALS: "To lessen the vertigo and dizziness"  OBJECTIVE:    TODAY'S TREATMENT: 01/07/2022 Activity Comments  Forward/back walking 20 ft x 6 reps  No symptoms  Forward tandem walking, looking at floor targets, then looking at eye level targets, 20 ft x 4 reps each   On Airex: -Feet together with EO head turns/nods, diagonal head motions; then EC head turns/nods -Partial tandem EO head turns/nods, then EC head steady Increased unsteadiness with  tandem stance  Standing on Airex:   -alt step taps to cones, then to 12" step -forward step up/up, down/down to Airex  Intermittent UE support  Figure-8 ball through legs, with squats x 5 reps Reports slight increase in symptoms  Gait with clockwise/counterclockwise ball circles; gait with ball toss/catch laterally Counterclockwise brings on more symptoms (rates as 3/10)  -also rates 3/10  Gait with diagonal head motions (bowtie) 20 ft x 2 Symptoms rated 4/10  Gait with same side diagonals 20 ft x 2 More symptoms noted with L up, R down diagonals   Access Code: [email or web address] URL: [email or web address] Date: 01/07/2022-updates Prepared by: [facility]  Program Notes Forward/back step with head motion up on the forward step, head motion down on the step backwards.  Repeat 10 times on each leg.  Exercises - Gaze Stability (VOR) x1 Feet Together on Firm Ground With Horizontal Head Turns  - 1 x daily - 7 x weekly - 3 sets - 30 sec hold - Romberg Stance Eyes Closed on Foam Pad  - 1 x daily - 7 x weekly - 3 sets - 30 sec hold - Figure-8 Walking Around Cones  - 1 x daily - 7 x weekly - 1 sets - 3-5 reps - Standing Diagonal Chops with  Medicine Ball  - 1 x daily - 5 x weekly - 2 sets - 5-10 reps - Walking Gaze Stabilization Head Rotation  - 1 x daily - 5 x weekly - 2 sets - 4-6 reps - Standing Romberg to 1/2 Tandem Stance  - 1 x daily - 5 x weekly - 1 sets - 5 reps (on foam)   PATIENT EDUCATION: Education details: Updates to HEP Person educated: Patient Education method: Consulting civil engineer, Demonstration, and Handouts Education comprehension: verbalized understanding and returned demonstration         -------------------------------------------------------------------------------------- Measures below from initial evaluation:  DIAGNOSTIC FINDINGS: NA for this episode of care  COGNITION: Overall cognitive status: Within functional limits for tasks assessed    Cervical ROM:  WFL  Active A/PROM (deg) eval  Flexion   Extension   Right lateral flexion   Left lateral flexion   Right rotation   Left rotation   (Blank rows = not tested)    FUNCTIONAL TESTS:  Functional gait assessment: 19/30 (declined from 25/30 from last bout of therapy, ending 03/04/2021)     PATIENT SURVEYS:  FOTO 50/goal is 60  VESTIBULAR ASSESSMENT:  GENERAL OBSERVATION: No acute distress.   SYMPTOM BEHAVIOR:  Subjective history: Pt reports "waviness", things look wavy when I look down.  Slight lightheadedness.  Has worsened over the past 4-6 weeks despite doing the previous therapy exercises.  Non-Vestibular symptoms:  has had high blood pressure, but under control; recent stopped Tramadol; upcoming cataract surgery in January for both eyes   Type of dizziness: Imbalance (Disequilibrium), Lightheadedness/Faint, and "waviness"  Frequency: "with me most of the time"  Duration: seconds-minutes  Aggravating factors: Induced by motion: looking up at the ceiling, bending down to the ground, turning body quickly, and turning head quickly  Relieving factors: head stationary  Progression of symptoms: unchanged  OCULOMOTOR EXAM:  Ocular  Alignment: abnormal and L eye depressed  Ocular ROM: No Limitations  Spontaneous Nystagmus: absent  Gaze-Induced Nystagmus: absent  Smooth Pursuits: intact and c/o mild dizziness vertically  Saccades:  mild undershooting horizontal directions, some catch up noted L eye with L saccades  Convergence/Divergence: 2-3 cm     VESTIBULAR - OCULAR REFLEX:   Slow VOR: Normal  VOR Cancellation: Normal  Head-Impulse Test: HIT Right: negative HIT Left: negative  Dynamic Visual Acuity:  NA   POSITIONAL TESTING: Right Dix-Hallpike: no nystagmus, no symptoms, "quick dizzy" upon sitting up, and Duration: NA Left Dix-Hallpike: no nystagmus Right Roll Test: no nystagmus Left Roll Test: no nystagmus   M-CTSIB  Condition 1: Firm Surface, EO 30 Sec, Normal Sway  Condition 2: Firm Surface, EC 30 Sec, Normal Sway  Condition 3: Foam Surface, EO 30 Sec, Mild Sway  Condition 4: Foam Surface, EC 30 Sec, Mild Sway      OTHOSTATICS: not done    VESTIBULAR TREATMENT:                                                                                                   DATE: 12/04/2021   PATIENT EDUCATION: Education details: Eval results, POC; continue gaze stabilization exercises at home seated/standing and increase speed of head motions  Person educated: Patient Education method: Explanation and Demonstration Education comprehension: verbalized understanding and returned demonstration  HOME EXERCISE PROGRAM:  (Will need to update from previous bout of therapy)  GOALS: Goals reviewed with patient? Yes  SHORT TERM GOALS: Target date: = LTGs   LONG TERM GOALS: Target date: 12/152023>UPDATED TARGET 01/30/2022   Pt will be independent with progression of HEP for improved functional mobility and decreased dizziness. Baseline:  Goal status: GOAL MET/REVISED , 01/01/2022  2.  Pt will improve FGA score to at least 22/30 to decrease fall risk. Baseline: 19/30>25/30 01/01/2022 Goal status: GOAL  MET  3.  Pt will improve FOTO score to at least 60 to  demonstrate improved overall functional mobility. Baseline: 50 at eval>58/60 Goal status: IN PROGRESS   ASSESSMENT:  CLINICAL IMPRESSION: Skilled PT session today continued to focus on high level balance exercises with compliant surfaces, varied foot positions and head motions eyes open/eyes closed.  Pt is challenged today with partial tandem stance on compliant surface with head motions eyes open and then head steady/eyes closed.  He is also challenged today with diagonal head motions with gait, bringing unsteadiness symptoms up to about 4/10.  This subsides with brief stationary standing.  He is overall reporting improvement in better days and more good days than bad in regards to balance and dizziness.  OBJECTIVE IMPAIRMENTS: Abnormal gait, decreased balance, and dizziness.   ACTIVITY LIMITATIONS: bending, reach over head, and locomotion level  PARTICIPATION LIMITATIONS: community activity and outdoor fitness activities  PERSONAL FACTORS: Past/current experiences and 3+ comorbidities: See PMH above  are also affecting patient's functional outcome.   REHAB POTENTIAL: Good  CLINICAL DECISION MAKING: Stable/uncomplicated  EVALUATION COMPLEXITY: Low   PLAN:  PT FREQUENCY: 1x/wk   PT DURATION: 4 weeks (per recert 01/72/0910)  PLANNED INTERVENTIONS: Therapeutic exercises, Therapeutic activity, Neuromuscular re-education, Balance training, Gait training, Patient/Family education, Self Care, Vestibular training, and Canalith repositioning  PLAN FOR NEXT SESSION: Review progression of HEP; continue to progress diagonal motions (figure-8 squats and diagonal head motions with gait).  Pt may benefit from f/u with PCP to look into vestibular migraines as possible cause of sx. Work on gaze stabilization in standing, work on progressing compliant surfaces with head motions.  Work on gait, turns, activities for habituation.   Mady Haagensen, PT 01/07/22 3:27 PM Phone: [phone] Fax: [phone]   [facility] at [facility] [address], [place], [place] [address] Phone # [phone] Fax # [phone]

## 2022-01-10 ENCOUNTER — Other Ambulatory Visit: Payer: Self-pay | Admitting: Cardiovascular Disease

## 2022-01-14 ENCOUNTER — Ambulatory Visit: Payer: Medicare Other | Admitting: Physical Therapy

## 2022-01-14 ENCOUNTER — Encounter: Payer: Self-pay | Admitting: Physical Therapy

## 2022-01-14 DIAGNOSIS — R2681 Unsteadiness on feet: Secondary | ICD-10-CM | POA: Diagnosis not present

## 2022-01-14 DIAGNOSIS — R42 Dizziness and giddiness: Secondary | ICD-10-CM

## 2022-01-14 NOTE — Therapy (Signed)
OUTPATIENT PHYSICAL THERAPY VESTIBULAR TREATMENT NOTES     Patient Name: Kevin [name] MRN: [id number] DOB:Mar 09, 1945, 76 y.o., male Today's Date: 12/30/2021  END OF SESSION:   PT End of Session - 01/14/22 1307     Visit Number 8    Number of Visits 10    Date for PT Re-Evaluation 01/30/22    Authorization Type Medicare/AARP- [place] as of 01/07/2022    PT Start Time 1312    PT Stop Time 1357   in restroom for approx 4 minutes during session   PT Time Calculation (min) 45 min    Equipment Utilized During Treatment Gait belt    Activity Tolerance Patient tolerated treatment well    Behavior During Therapy WFL for tasks assessed/performed                    Past Medical History:  Diagnosis Date   Arthritis    osteoarthritis-hips/ shoulders- no issues at present   Complication of anesthesia    longer to wake up   Foley catheter in place 05/30/2013   PT HAD SURG 05/30/13 - CYSTO AND CLOT EVACUATION BLADDER / PROSTATE - UNABLE TO VOID AFTER DISCHARGE - CAME BACK TO ER SAME NIGHT AND HAD FOLEY INSERTED TO DRAINAGE BAG - URINE WAS BLOODY BUT PT REPORTS ON 06/07/13 THAT URINE IN BAG MOSTLY CLEAR NOW.   GERD (gastroesophageal reflux disease)    History of kidney stones    x1   Hypertension    Muscle tension dysphonia    Past Surgical History:  Procedure Laterality Date   BACK SURGERY     lumbar    CYSTOSCOPY W/ URETEROSCOPY W/ LITHOTRIPSY     CYSTOSCOPY/RETROGRADE/URETEROSCOPY/STONE EXTRACTION WITH BASKET N/A 05/30/2013   Procedure: CYSTOSCOPY , BLADDER BIOPSY AND CLOT EVACUATION , CAUTERIZATIO OF LATERAL RIGHT AND LEFT LOBE OF PROSTATE;  Surgeon: Ailene Rud, MD;  Location: [facility];  Service: Urology;  Laterality: N/A;   HERNIA REPAIR     inguinal   INSERTION OF MESH N/A 11/24/2017   Procedure: INSERTION OF MESH;  Surgeon: Donnie Mesa, MD;  Location: [facility];  Service: General;  Laterality: N/A;   JOINT REPLACEMENT     '00-left/'08 RTHA   KNEE ARTHROSCOPY      left knee meniscus surgery   PROSTATE SURGERY Bilateral    7- 8 yrs ago   SHOULDER ARTHROSCOPY Right    SHOULDER ARTHROTOMY Left    TRANSURETHRAL RESECTION OF PROSTATE N/A 06/09/2013   Procedure: TRANSURETHRAL RESECTION OF THE PROSTATE (TURP) WITH GYRUS;  Surgeon: Ailene Rud, MD;  Location: [facility];  Service: Urology;  Laterality: N/A;   UMBILICAL HERNIA REPAIR N/A 11/24/2017   Procedure: UMBILICAL HERNIA REPAIR WITH MESH;  Surgeon: Donnie Mesa, MD;  Location: [facility];  Service: General;  Laterality: N/A;   Patient Active Problem List   Diagnosis Date Noted   Elevated coronary artery calcium score 10/28/2021   Hyperlipidemia 05/26/2021   Arthritis 12/18/2020   Palpitations 04/27/2017   Moderate aortic insufficiency 04/27/2017   Blurred vision, bilateral 05/31/2014   Essential hypertension 01/25/2014   Benign prostatic hypertrophy 06/09/2013    PCP: Lujean Amel, MD  REFERRING PROVIDER:   Raylene Miyamoto, MD    REFERRING DIAG:  R42 (ICD-10-CM) - Dizziness   THERAPY DIAG:  Unsteadiness on feet  BPPV (benign paroxysmal positional vertigo), left  Dizziness and giddiness  ONSET DATE:  11/19/2021 (MD referral)  Rationale for Evaluation and Treatment: Rehabilitation  SUBJECTIVE:  SUBJECTIVE STATEMENT: Maybe a little backslide in my issues.  Not as bad as it's been at some points.  Little balance and dizziness issues with standing and walking.  Pt accompanied by: self  PERTINENT HISTORY: He began having episodes of dizziness in 2020.  He describes it as both a spinning and lightheaded sensation.  Sometimes he may feel briefly confused for a moment.  Onset is acute and gradually dissipates.  (Per neurologist notes) *Per report 12/22/2021:  pt has blurred vision due to cataracts-is scheduled for R eye cataract surgery 01/20/2022 and L eye 02/03/2022  PAIN:  Are you having pain? No pain  PRECAUTIONS: Fall  WEIGHT BEARING RESTRICTIONS: No  FALLS: Has patient  fallen in last 6 months? No  LIVING ENVIRONMENT: Lives with: lives with their spouse Lives in: House/apartment Stairs: Yes: Internal: 2 steps; on right going up and External: 8>8 steps; on right going up and on left going up Has following equipment at home: None  PLOF: Independent, Independent with gait, and Leisure: yoga, stretch, tai chi, walks daily  PATIENT GOALS: "To lessen the vertigo and dizziness"  OBJECTIVE:    TODAY'S TREATMENT: 01/14/2022 Activity Comments  Reviewed HEP addition from last visit:  partial tandem with EO head turns/nods, then EC head steady Increased unsteadiness with L foot posteriorly  On Airex: -Feet together with EO head turns/nods, diagonal head motions; then EC head turns/nods -Partial tandem EO head turns/nods, then EC head steady Increased unsteadiness with partial tandem stance with LLE posterior position  Standing on Airex:   -alt step taps to single cones, then to double cones, to 12" step -forward step up/up, down/down to Airex -forward step up>back step behind, working on dynamic SLS -more unsteadiness on LLE as stance, light UE support  MMT-see below   Gait with head turns/head nods 25 ft x 4 reps   Gait with diagonal head motions 25 ft x 4 reps Increased sway with L up, R down head positions   MMT BLES:  RLE      LLE  Hip flexion  5/5      4/5 Hip abduction 4/5       Knee extension 5/5      4+/5  Knee flexion  5/5      4/5  Access Code: [email or web address] URL: [email or web address] Date: 01/14/2022-addition Prepared by: [facility]  Program Notes Forward/back step with head motion up on the forward step, head motion down on the step backwards.  Repeat 10 times on each leg.  Exercises - Gaze Stability (VOR) x1 Feet Together on Firm Ground With Horizontal Head Turns  - 1 x daily - 7 x weekly - 3 sets - 30 sec hold - Romberg Stance Eyes Closed on Foam Pad  - 1 x daily - 7 x weekly - 3 sets -  30 sec hold - Figure-8 Walking Around Cones  - 1 x daily - 7 x weekly - 1 sets - 3-5 reps - Standing Diagonal Chops with Medicine Ball  - 1 x daily - 5 x weekly - 2 sets - 5-10 reps - Walking Gaze Stabilization Head Rotation  - 1 x daily - 5 x weekly - 2 sets - 4-6 reps - Standing Romberg to 1/2 Tandem Stance  - 1 x daily - 5 x weekly - 1 sets - 5 reps - Forward Walking with Diagonal Head Turns  - 1 x daily - 5 x weekly - 1 sets - 3-5 reps  PATIENT EDUCATION: Education details: Updates to HEP Person educated: Patient Education method: Consulting civil engineer, Media planner, and Handouts Education comprehension: verbalized understanding and returned demonstration         -------------------------------------------------------------------------------------- Measures below from initial evaluation:  DIAGNOSTIC FINDINGS: NA for this episode of care  COGNITION: Overall cognitive status: Within functional limits for tasks assessed    Cervical ROM:  WFL  Active A/PROM (deg) eval  Flexion   Extension   Right lateral flexion   Left lateral flexion   Right rotation   Left rotation   (Blank rows = not tested)    FUNCTIONAL TESTS:  Functional gait assessment: 19/30 (declined from 25/30 from last bout of therapy, ending 03/04/2021)     PATIENT SURVEYS:  FOTO 50/goal is 60  VESTIBULAR ASSESSMENT:  GENERAL OBSERVATION: No acute distress.   SYMPTOM BEHAVIOR:  Subjective history: Pt reports "waviness", things look wavy when I look down.  Slight lightheadedness.  Has worsened over the past 4-6 weeks despite doing the previous therapy exercises.  Non-Vestibular symptoms:  has had high blood pressure, but under control; recent stopped Tramadol; upcoming cataract surgery in January for both eyes   Type of dizziness: Imbalance (Disequilibrium), Lightheadedness/Faint, and "waviness"  Frequency: "with me most of the time"  Duration: seconds-minutes  Aggravating factors: Induced by motion:  looking up at the ceiling, bending down to the ground, turning body quickly, and turning head quickly  Relieving factors: head stationary  Progression of symptoms: unchanged  OCULOMOTOR EXAM:  Ocular Alignment: abnormal and L eye depressed  Ocular ROM: No Limitations  Spontaneous Nystagmus: absent  Gaze-Induced Nystagmus: absent  Smooth Pursuits: intact and c/o mild dizziness vertically  Saccades:  mild undershooting horizontal directions, some catch up noted L eye with L saccades  Convergence/Divergence: 2-3 cm     VESTIBULAR - OCULAR REFLEX:   Slow VOR: Normal  VOR Cancellation: Normal  Head-Impulse Test: HIT Right: negative HIT Left: negative  Dynamic Visual Acuity:  NA   POSITIONAL TESTING: Right Dix-Hallpike: no nystagmus, no symptoms, "quick dizzy" upon sitting up, and Duration: NA Left Dix-Hallpike: no nystagmus Right Roll Test: no nystagmus Left Roll Test: no nystagmus   M-CTSIB  Condition 1: Firm Surface, EO 30 Sec, Normal Sway  Condition 2: Firm Surface, EC 30 Sec, Normal Sway  Condition 3: Foam Surface, EO 30 Sec, Mild Sway  Condition 4: Foam Surface, EC 30 Sec, Mild Sway      OTHOSTATICS: not done    VESTIBULAR TREATMENT:                                                                                                   DATE: 12/04/2021   PATIENT EDUCATION: Education details: Eval results, POC; continue gaze stabilization exercises at home seated/standing and increase speed of head motions  Person educated: Patient Education method: Explanation and Demonstration Education comprehension: verbalized understanding and returned demonstration  HOME EXERCISE PROGRAM:  (Will need to update from previous bout of therapy)  GOALS: Goals reviewed with patient? Yes  SHORT TERM GOALS: Target date: = LTGs   LONG TERM GOALS: Target date: 12/152023>UPDATED TARGET  01/30/2022   Pt will be independent with progression of HEP for improved functional mobility and  decreased dizziness. Baseline:  Goal status: GOAL MET/REVISED , 01/01/2022  2.  Pt will improve FGA score to at least 22/30 to decrease fall risk. Baseline: 19/30>25/30 01/01/2022 Goal status: GOAL MET  3.  Pt will improve FOTO score to at least 60 to demonstrate improved overall functional mobility. Baseline: 50 at eval>58/60 Goal status: IN PROGRESS   ASSESSMENT:  CLINICAL IMPRESSION: Skilled PT session today continued to focus on high level balance exercises with compliant surfaces, varied foot positions and head motions eyes open/eyes closed.  Pt is challenged today with exercises where LLE is the stance or the more heavily weightbearing position.  Briefly assessed MMT of LLE, with weakness noted in L hip more so than R, which may explain the increased unsteadiness with LLE as stance.  Worked again on short distance gait with diagonal head motions, with more unsteadiness up L and down to R side, mild trunk sway and unsteadiness noted.  Overall, pt continues to do well and progress towards goals; will plan to assess LTGs and discuss POC next visit.  OBJECTIVE IMPAIRMENTS: Abnormal gait, decreased balance, and dizziness.   ACTIVITY LIMITATIONS: bending, reach over head, and locomotion level  PARTICIPATION LIMITATIONS: community activity and outdoor fitness activities  PERSONAL FACTORS: Past/current experiences and 3+ comorbidities: See PMH above  are also affecting patient's functional outcome.   REHAB POTENTIAL: Good  CLINICAL DECISION MAKING: Stable/uncomplicated  EVALUATION COMPLEXITY: Low   PLAN:  PT FREQUENCY: 1x/wk   PT DURATION: 4 weeks (per recert 16/10/9602)  PLANNED INTERVENTIONS: Therapeutic exercises, Therapeutic activity, Neuromuscular re-education, Balance training, Gait training, Patient/Family education, Self Care, Vestibular training, and Canalith repositioning  PLAN FOR NEXT SESSION: Review progression of HEP; continue to progress diagonal motions  (figure-8 squats and diagonal head motions with gait).  Overall, pt seems to be doing better-has one more appt scheduled and then a break due to cataract surgery.  Need to assess LTGs/discuss POC-pt could potentially d/c if he feels comfortable with his HEP progression.  Mady Haagensen, PT 01/14/22 2:10 PM Phone: [phone] Fax: [phone]   [facility] at [facility] [place], [place], [place] [address] Phone # [phone] Fax # [phone]

## 2022-01-15 ENCOUNTER — Other Ambulatory Visit: Payer: Self-pay | Admitting: Cardiovascular Disease

## 2022-01-15 LAB — LIPID PANEL
Chol/HDL Ratio: 5.4 ratio — ABNORMAL HIGH (ref 0.0–5.0)
Cholesterol, Total: 189 mg/dL (ref 100–199)
HDL: 35 mg/dL — ABNORMAL LOW (ref >39)
LDL Chol Calc (NIH): 124 mg/dL — ABNORMAL HIGH (ref 0–99)
Triglycerides: 165 mg/dL — ABNORMAL HIGH (ref 0–149)
VLDL Cholesterol Cal: 30 mg/dL (ref 5–40)

## 2022-01-27 NOTE — Therapy (Signed)
OUTPATIENT PHYSICAL THERAPY VESTIBULAR TREATMENT RE-CERTIFICATION     Patient Name: Kevin [name] MRN: [id number] DOB:Oct 19, 1945, 77 y.o., male Today's Date: 12/30/2021  END OF SESSION:   PT End of Session - 01/28/22 1520     Visit Number 9    Number of Visits 10    Date for PT Re-Evaluation 02/25/22    Authorization Type Medicare/AARP    PT Start Time 1448    PT Stop Time 1519    PT Time Calculation (min) 31 min    Equipment Utilized During Treatment Gait belt    Activity Tolerance Patient tolerated treatment well    Behavior During Therapy WFL for tasks assessed/performed                     Past Medical History:  Diagnosis Date   Arthritis    osteoarthritis-hips/ shoulders- no issues at present   Complication of anesthesia    longer to wake up   Foley catheter in place 05/30/2013   PT HAD SURG 05/30/13 - CYSTO AND CLOT EVACUATION BLADDER / PROSTATE - UNABLE TO VOID AFTER DISCHARGE - CAME BACK TO ER SAME NIGHT AND HAD FOLEY INSERTED TO DRAINAGE BAG - URINE WAS BLOODY BUT PT REPORTS ON 06/07/13 THAT URINE IN BAG MOSTLY CLEAR NOW.   GERD (gastroesophageal reflux disease)    History of kidney stones    x1   Hypertension    Muscle tension dysphonia    Past Surgical History:  Procedure Laterality Date   BACK SURGERY     lumbar    CYSTOSCOPY W/ URETEROSCOPY W/ LITHOTRIPSY     CYSTOSCOPY/RETROGRADE/URETEROSCOPY/STONE EXTRACTION WITH BASKET N/A 05/30/2013   Procedure: CYSTOSCOPY , BLADDER BIOPSY AND CLOT EVACUATION , CAUTERIZATIO OF LATERAL RIGHT AND LEFT LOBE OF PROSTATE;  Surgeon: Kathi Ludwig, MD;  Location: [facility];  Service: Urology;  Laterality: N/A;   HERNIA REPAIR     inguinal   INSERTION OF MESH N/A 11/24/2017   Procedure: INSERTION OF MESH;  Surgeon: Manus Rudd, MD;  Location: [facility];  Service: General;  Laterality: N/A;   JOINT REPLACEMENT     '00-left/'08 RTHA   KNEE ARTHROSCOPY     left knee meniscus surgery   PROSTATE SURGERY  Bilateral    7- 8 yrs ago   SHOULDER ARTHROSCOPY Right    SHOULDER ARTHROTOMY Left    TRANSURETHRAL RESECTION OF PROSTATE N/A 06/09/2013   Procedure: TRANSURETHRAL RESECTION OF THE PROSTATE (TURP) WITH GYRUS;  Surgeon: Kathi Ludwig, MD;  Location: [facility];  Service: Urology;  Laterality: N/A;   UMBILICAL HERNIA REPAIR N/A 11/24/2017   Procedure: UMBILICAL HERNIA REPAIR WITH MESH;  Surgeon: Manus Rudd, MD;  Location: [facility];  Service: General;  Laterality: N/A;   Patient Active Problem List   Diagnosis Date Noted   Elevated coronary artery calcium score 10/28/2021   Hyperlipidemia 05/26/2021   Arthritis 12/18/2020   Palpitations 04/27/2017   Moderate aortic insufficiency 04/27/2017   Blurred vision, bilateral 05/31/2014   Essential hypertension 01/25/2014   Benign prostatic hypertrophy 06/09/2013    PCP: Darrow Bussing, MD  REFERRING PROVIDER:   Karle Barr, MD    REFERRING DIAG:  R42 (ICD-10-CM) - Dizziness   THERAPY DIAG:  Unsteadiness on feet  BPPV (benign paroxysmal positional vertigo), left  Dizziness and giddiness  ONSET DATE:  11/19/2021 (MD referral)  Rationale for Evaluation and Treatment: Rehabilitation  SUBJECTIVE:   SUBJECTIVE STATEMENT: Had cataract surgery last week and he was unable to  do his HEP for some time but is back at it now. Reports continued improvement. Reports remaining symptoms with head turns and reports instability with tandem stance with L foot back. Denies precautions since surgery. Would like to be seen for 1 more appointment after his L eye surgery on the 16th. Reports no less than 50% improvement since initial assessment.   Pt accompanied by: self  PERTINENT HISTORY: He began having episodes of dizziness in 2020.  He describes it as both a spinning and lightheaded sensation.  Sometimes he may feel briefly confused for a moment.  Onset is acute and gradually dissipates.  (Per neurologist notes) *Per report 12/22/2021:  pt has  blurred vision due to cataracts-is scheduled for R eye cataract surgery 01/20/2022 and L eye 02/03/2022  PAIN:  Are you having pain? No pain  PRECAUTIONS: Fall  WEIGHT BEARING RESTRICTIONS: No  FALLS: Has patient fallen in last 6 months? No  LIVING ENVIRONMENT: Lives with: lives with their spouse Lives in: House/apartment Stairs: Yes: Internal: 2 steps; on right going up and External: 8>8 steps; on right going up and on left going up Has following equipment at home: None  PLOF: Independent, Independent with gait, and Leisure: yoga, stretch, tai chi, walks daily  PATIENT GOALS: "To lessen the vertigo and dizziness"  OBJECTIVE:     TODAY'S TREATMENT: 01/28/22 Activity Comments  FOTO [phone]  Bridge with HS bias 10x Cueing to adjust position to reduce HS cramp  Hooklying march with red TB 10x Good tolerance   Sidestepping with red loop around toes  2x34ft Cues to maintain slight knee bend  Ant/pos monster walk with red loop 2x38ft Cues to bend forward slightly to avoid posterior LOB       PATIENT EDUCATION: Education details: discussed pt's current fitness regimen and updated HEP with LE strengthening for improved stability with balance exercises; verbal review of balance/dizziness HEP Person educated: Patient Education method: Explanation, Demonstration, Tactile cues, Verbal cues, and Handouts Education comprehension: verbalized understanding and returned demonstration   HOME EXERCISE PROGRAM Last updated: 01/28/22 Access Code: [email or web address] URL: [email or web address] Date: 01/28/2022 Prepared by: [facility]  Program Notes Forward/back step with head motion up on the forward step, head motion down on the step backwards.  Repeat 10 times on each leg.  Exercises - Gaze Stability (VOR) x1 Feet Together on Firm Ground With Horizontal Head Turns  - 1 x daily - 7 x weekly - 3 sets - 30 sec hold - Romberg Stance Eyes Closed on  Foam Pad  - 1 x daily - 7 x weekly - 3 sets - 30 sec hold - Figure-8 Walking Around Cones  - 1 x daily - 7 x weekly - 1 sets - 3-5 reps - Standing Diagonal Chops with Medicine Ball  - 1 x daily - 5 x weekly - 2 sets - 5-10 reps - Walking Gaze Stabilization Head Rotation  - 1 x daily - 5 x weekly - 2 sets - 4-6 reps - Standing Romberg to 1/2 Tandem Stance  - 1 x daily - 5 x weekly - 1 sets - 5 reps - Forward Walking with Diagonal Head Turns  - 1 x daily - 5 x weekly - 1 sets - 3-5 reps - Side Stepping with Resistance at Feet  - 1 x daily - 5 x weekly - 2 sets - 10 reps - Forward Monster Walks  - 1 x daily - 5 x weekly - 2 sets -  10 reps - Backward Monster Walks  - 1 x daily - 5 x weekly - 2 sets - 10 reps - Supine Heel Bridge  - 1 x daily - 5 x weekly - 2 sets - 10 reps  --------------------------------------------------- Measures below from initial evaluation:  DIAGNOSTIC FINDINGS: NA for this episode of care  COGNITION: Overall cognitive status: Within functional limits for tasks assessed    Cervical ROM:  WFL  Active A/PROM (deg) eval  Flexion   Extension   Right lateral flexion   Left lateral flexion   Right rotation   Left rotation   (Blank rows = not tested)    FUNCTIONAL TESTS:  Functional gait assessment: 19/30 (declined from 25/30 from last bout of therapy, ending 03/04/2021)     PATIENT SURVEYS:  FOTO 50/goal is 60  VESTIBULAR ASSESSMENT:  GENERAL OBSERVATION: No acute distress.   SYMPTOM BEHAVIOR:  Subjective history: Pt reports "waviness", things look wavy when I look down.  Slight lightheadedness.  Has worsened over the past 4-6 weeks despite doing the previous therapy exercises.  Non-Vestibular symptoms:  has had high blood pressure, but under control; recent stopped Tramadol; upcoming cataract surgery in January for both eyes   Type of dizziness: Imbalance (Disequilibrium), Lightheadedness/Faint, and "waviness"  Frequency: "with me most of the  time"  Duration: seconds-minutes  Aggravating factors: Induced by motion: looking up at the ceiling, bending down to the ground, turning body quickly, and turning head quickly  Relieving factors: head stationary  Progression of symptoms: unchanged  OCULOMOTOR EXAM:  Ocular Alignment: abnormal and L eye depressed  Ocular ROM: No Limitations  Spontaneous Nystagmus: absent  Gaze-Induced Nystagmus: absent  Smooth Pursuits: intact and c/o mild dizziness vertically  Saccades:  mild undershooting horizontal directions, some catch up noted L eye with L saccades  Convergence/Divergence: 2-3 cm     VESTIBULAR - OCULAR REFLEX:   Slow VOR: Normal  VOR Cancellation: Normal  Head-Impulse Test: HIT Right: negative HIT Left: negative  Dynamic Visual Acuity:  NA   POSITIONAL TESTING: Right Dix-Hallpike: no nystagmus, no symptoms, "quick dizzy" upon sitting up, and Duration: NA Left Dix-Hallpike: no nystagmus Right Roll Test: no nystagmus Left Roll Test: no nystagmus   M-CTSIB  Condition 1: Firm Surface, EO 30 Sec, Normal Sway  Condition 2: Firm Surface, EC 30 Sec, Normal Sway  Condition 3: Foam Surface, EO 30 Sec, Mild Sway  Condition 4: Foam Surface, EC 30 Sec, Mild Sway      OTHOSTATICS: not done    VESTIBULAR TREATMENT:                                                                                                   DATE: 12/04/2021   PATIENT EDUCATION: Education details: Eval results, POC; continue gaze stabilization exercises at home seated/standing and increase speed of head motions  Person educated: Patient Education method: Explanation and Demonstration Education comprehension: verbalized understanding and returned demonstration  HOME EXERCISE PROGRAM:  (Will need to update from previous bout of therapy)  GOALS: Goals reviewed with patient? Yes  SHORT TERM GOALS: Target date: =  LTGs   LONG TERM GOALS: Target date: 02/25/22   Pt will be independent with progression  of HEP for improved functional mobility and decreased dizziness. Baseline:  Goal status: GOAL MET/REVISED , 01/28/22  2.  Pt will improve FGA score to at least 22/30 to decrease fall risk. Baseline: 19/30>25/30 01/01/2022 Goal status: GOAL MET  3.  Pt will improve FOTO score to at least 60 to demonstrate improved overall functional mobility. Baseline: 50 at eval>58/60; 49.9750 01/28/22 Goal status: IN PROGRESS 01/28/22  4.  Pt to report at least 60% improvement in dizziness.  Baseline: 50%  Goal status: INITIAL    ASSESSMENT:  CLINICAL IMPRESSION: Patient arrived to session with report of remaining symptoms with head turns and reports instability with tandem stance with L foot back. Patient requests to continue to be seen to address these remaining symptoms. Reports no less than 50% improvement since initial assessment. Reviewed entirety of HEP for good understanding and benefit. Also initiated L hip strengthening and it was identified that it was slightly weaker in previous session, to help assist in balance and stability. Patient tolerated session well. Patient is progressing well towards goals. Would benefit from additional session to ensure good symptom management/no flare up after L cataract surgery on 02/03/22.   OBJECTIVE IMPAIRMENTS: Abnormal gait, decreased balance, and dizziness.   ACTIVITY LIMITATIONS: bending, reach over head, and locomotion level  PARTICIPATION LIMITATIONS: community activity and outdoor fitness activities  PERSONAL FACTORS: Past/current experiences and 3+ comorbidities: See PMH above  are also affecting patient's functional outcome.   REHAB POTENTIAL: Good  CLINICAL DECISION MAKING: Stable/uncomplicated  EVALUATION COMPLEXITY: Low   PLAN:  PT FREQUENCY: 1x/wk   PT DURATION: 4 weeks (per recert 01/01/2022)  PLANNED INTERVENTIONS: Therapeutic exercises, Therapeutic activity, Neuromuscular re-education, Balance training, Gait training, Patient/Family  education, Self Care, Vestibular training, and Canalith repositioning  PLAN FOR NEXT SESSION: Review progression of HEP; continue to progress diagonal motions (figure-8 squats and diagonal head motions with gait). Will be ready for DC on last session 02/11/22    Anette Guarneri, PT, DPT 01/28/22 3:26 PM  [facility] at [facility] [address] [place], [address] [place], [place] [address] Phone # [phone] Fax # [phone]

## 2022-01-28 ENCOUNTER — Encounter: Payer: Self-pay | Admitting: Physical Therapy

## 2022-01-28 ENCOUNTER — Ambulatory Visit: Payer: Medicare Other | Attending: Otolaryngology | Admitting: Physical Therapy

## 2022-01-28 DIAGNOSIS — R42 Dizziness and giddiness: Secondary | ICD-10-CM | POA: Insufficient documentation

## 2022-01-28 DIAGNOSIS — R2681 Unsteadiness on feet: Secondary | ICD-10-CM | POA: Insufficient documentation

## 2022-01-28 DIAGNOSIS — H8112 Benign paroxysmal vertigo, left ear: Secondary | ICD-10-CM | POA: Insufficient documentation

## 2022-02-02 ENCOUNTER — Encounter (HOSPITAL_BASED_OUTPATIENT_CLINIC_OR_DEPARTMENT_OTHER): Payer: Self-pay

## 2022-02-02 ENCOUNTER — Ambulatory Visit (HOSPITAL_BASED_OUTPATIENT_CLINIC_OR_DEPARTMENT_OTHER)
Admission: RE | Admit: 2022-02-02 | Discharge: 2022-02-02 | Disposition: A | Discharge status: Home / Self Care | Payer: Medicare Other | Source: Ambulatory Visit | Attending: Family Medicine | Admitting: Family Medicine

## 2022-02-02 DIAGNOSIS — E7800 Pure hypercholesterolemia, unspecified: Secondary | ICD-10-CM | POA: Insufficient documentation

## 2022-02-11 ENCOUNTER — Ambulatory Visit: Payer: Medicare Other | Admitting: Physical Therapy

## 2022-02-11 ENCOUNTER — Encounter: Payer: Self-pay | Admitting: Physical Therapy

## 2022-02-11 DIAGNOSIS — R42 Dizziness and giddiness: Secondary | ICD-10-CM

## 2022-02-11 DIAGNOSIS — R2681 Unsteadiness on feet: Secondary | ICD-10-CM

## 2022-02-11 NOTE — Therapy (Signed)
OUTPATIENT PHYSICAL THERAPY VESTIBULAR TREATMENT NOTE/DISCHARGE SUMMARY     Patient Name: Kevin [name] MRN: [id number] DOB:1945/06/17, 77 y.o., male Today's Date: 12/30/2021   PHYSICAL THERAPY DISCHARGE SUMMARY  Visits from Start of Care: 10  Current functional level related to goals / functional outcomes: See LTGs below-pt has met 3 of 4 LTGs   Remaining deficits: High level balance, unsteadiness symptoms with VOR   Education / Equipment: Educated in HEP progression   Patient agrees to discharge. Patient goals were met. Patient is being discharged due to meeting the stated rehab goals.  Mady Haagensen, PT 02/11/22 3:20 PM Phone: [phone] Fax: [phone]   END OF SESSION:   PT End of Session - 02/11/22 1313     Visit Number 10    Number of Visits 10    Date for PT Re-Evaluation 02/25/22    Authorization Type Medicare/AARP    PT Start Time 1317    PT Stop Time 1352    PT Time Calculation (min) 35 min    Equipment Utilized During Treatment --    Activity Tolerance Patient tolerated treatment well    Behavior During Therapy WFL for tasks assessed/performed                     Past Medical History:  Diagnosis Date   Arthritis    osteoarthritis-hips/ shoulders- no issues at present   Complication of anesthesia    longer to wake up   Foley catheter in place 05/30/2013   PT HAD SURG 05/30/13 - CYSTO AND CLOT EVACUATION BLADDER / PROSTATE - UNABLE TO VOID AFTER DISCHARGE - CAME BACK TO ER SAME NIGHT AND HAD FOLEY INSERTED TO DRAINAGE BAG - URINE WAS BLOODY BUT PT REPORTS ON 06/07/13 THAT URINE IN BAG MOSTLY CLEAR NOW.   GERD (gastroesophageal reflux disease)    History of kidney stones    x1   Hypertension    Muscle tension dysphonia    Past Surgical History:  Procedure Laterality Date   BACK SURGERY     lumbar    CYSTOSCOPY W/ URETEROSCOPY W/ LITHOTRIPSY     CYSTOSCOPY/RETROGRADE/URETEROSCOPY/STONE EXTRACTION WITH BASKET N/A 05/30/2013    Procedure: CYSTOSCOPY , BLADDER BIOPSY AND CLOT EVACUATION , CAUTERIZATIO OF LATERAL RIGHT AND LEFT LOBE OF PROSTATE;  Surgeon: Ailene Rud, MD;  Location: [facility];  Service: Urology;  Laterality: N/A;   HERNIA REPAIR     inguinal   INSERTION OF MESH N/A 11/24/2017   Procedure: INSERTION OF MESH;  Surgeon: Donnie Mesa, MD;  Location: [facility];  Service: General;  Laterality: N/A;   JOINT REPLACEMENT     '00-left/'08 [facility]   KNEE ARTHROSCOPY     left knee meniscus surgery   PROSTATE SURGERY Bilateral    7- 8 yrs ago   SHOULDER ARTHROSCOPY Right    SHOULDER ARTHROTOMY Left    TRANSURETHRAL RESECTION OF PROSTATE N/A 06/09/2013   Procedure: TRANSURETHRAL RESECTION OF THE PROSTATE (TURP) WITH GYRUS;  Surgeon: Ailene Rud, MD;  Location: [facility];  Service: Urology;  Laterality: N/A;   UMBILICAL HERNIA REPAIR N/A 11/24/2017   Procedure: UMBILICAL HERNIA REPAIR WITH MESH;  Surgeon: Donnie Mesa, MD;  Location: [facility];  Service: General;  Laterality: N/A;   Patient Active Problem List   Diagnosis Date Noted   Elevated coronary artery calcium score 10/28/2021   Hyperlipidemia 05/26/2021   Arthritis 12/18/2020   Palpitations 04/27/2017   Moderate aortic insufficiency 04/27/2017   Blurred vision, bilateral 05/31/2014  Essential hypertension 01/25/2014   Benign prostatic hypertrophy 06/09/2013    PCP: Lujean Amel, MD  REFERRING PROVIDER:   Raylene Miyamoto, MD    REFERRING DIAG:  R42 (ICD-10-CM) - Dizziness   THERAPY DIAG:  Unsteadiness on feet  BPPV (benign paroxysmal positional vertigo), left  Dizziness and giddiness  ONSET DATE:  11/19/2021 (MD referral)  Rationale for Evaluation and Treatment: Rehabilitation  SUBJECTIVE:   SUBJECTIVE STATEMENT: Had the eye surgeries, 2nd one on 02/03/22.  Was not able to really do my exercises.  Notice some improvement overall, just not fully where I wanted to be.  See Dr. Benjamine Mola tomorrow.  Pt accompanied by: self  PERTINENT  HISTORY: He began having episodes of dizziness in 2020.  He describes it as both a spinning and lightheaded sensation.  Sometimes he may feel briefly confused for a moment.  Onset is acute and gradually dissipates.  (Per neurologist notes) *Per report 12/22/2021:  pt has blurred vision due to cataracts-is scheduled for R eye cataract surgery 01/20/2022 and L eye 02/03/2022  PAIN:  Are you having pain? No pain  PRECAUTIONS: Fall  WEIGHT BEARING RESTRICTIONS: No  FALLS: Has patient fallen in last 6 months? No  LIVING ENVIRONMENT: Lives with: lives with their spouse Lives in: House/apartment Stairs: Yes: Internal: 2 steps; on right going up and External: 8>8 steps; on right going up and on left going up Has following equipment at home: None  PLOF: Independent, Independent with gait, and Leisure: yoga, stretch, tai chi, walks daily  PATIENT GOALS: "To lessen the vertigo and dizziness"  OBJECTIVE:    TODAY'S TREATMENT: 02/11/2022 Activity Comments  Reviewed exercises from update last week: -bridging x 10 reps -resisted sidestepping  -resisted monster walks -took away band for monster walks due to glut cramping, otherwise performs well  Reviewed remaining exercises in HEP-see below Pt rates up to 3/10 symptoms with VOR standing, VOR gait, mild unsteadiness with feet together/EC on foam  FOTO score:  61 Improved from 49.9 last visit                PATIENT EDUCATION: Education details: progress towards PT goal, POC, plans for d/c today.  Answered pt's questions about vestibular system/BPPV; continued to recommend that pt continue consistent HEP performance in addition to his exercise routine Person educated: Patient Education method: Explanation Education comprehension: verbalized understanding   HOME EXERCISE PROGRAM Last updated: 01/28/22 Access Code: [email or web address] URL: [email or web address] Date: 01/28/2022 Prepared by: [facility]  Program Notes Forward/back step with head motion up on the forward step, head motion down on the step backwards.  Repeat 10 times on each leg.  Exercises - Gaze Stability (VOR) x1 Feet Together on Firm Ground With Horizontal Head Turns  - 1 x daily - 7 x weekly - 3 sets - 30 sec hold - Romberg Stance Eyes Closed on Foam Pad  - 1 x daily - 7 x weekly - 3 sets - 30 sec hold - Figure-8 Walking Around Cones  - 1 x daily - 7 x weekly - 1 sets - 3-5 reps - Standing Diagonal Chops with Medicine Ball  - 1 x daily - 5 x weekly - 2 sets - 5-10 reps - Walking Gaze Stabilization Head Rotation  - 1 x daily - 5 x weekly - 2 sets - 4-6 reps - Standing Romberg to 1/2 Tandem Stance  - 1 x daily - 5 x weekly - 1 sets - 5 reps -  Forward Walking with Diagonal Head Turns  - 1 x daily - 5 x weekly - 1 sets - 3-5 reps - Side Stepping with Resistance at Feet  - 1 x daily - 5 x weekly - 2 sets - 10 reps - Forward Monster Walks  - 1 x daily - 5 x weekly - 2 sets - 10 reps - Backward Monster Walks  - 1 x daily - 5 x weekly - 2 sets - 10 reps - Supine Heel Bridge  - 1 x daily - 5 x weekly - 2 sets - 10 reps  --------------------------------------------------- Measures below from initial evaluation:  DIAGNOSTIC FINDINGS: NA for this episode of care  COGNITION: Overall cognitive status: Within functional limits for tasks assessed    Cervical ROM:  WFL  Active A/PROM (deg) eval  Flexion   Extension   Right lateral flexion   Left lateral flexion   Right rotation   Left rotation   (Blank rows = not tested)    FUNCTIONAL TESTS:  Functional gait assessment: 19/30 (declined from 25/30 from last bout of therapy, ending 03/04/2021)     PATIENT SURVEYS:  FOTO 50/goal is 60  VESTIBULAR ASSESSMENT:  GENERAL OBSERVATION: No acute distress.   SYMPTOM BEHAVIOR:  Subjective history: Pt reports "waviness", things look wavy when I look down.  Slight lightheadedness.  Has worsened over the past 4-6  weeks despite doing the previous therapy exercises.  Non-Vestibular symptoms:  has had high blood pressure, but under control; recent stopped Tramadol; upcoming cataract surgery in January for both eyes   Type of dizziness: Imbalance (Disequilibrium), Lightheadedness/Faint, and "waviness"  Frequency: "with me most of the time"  Duration: seconds-minutes  Aggravating factors: Induced by motion: looking up at the ceiling, bending down to the ground, turning body quickly, and turning head quickly  Relieving factors: head stationary  Progression of symptoms: unchanged  OCULOMOTOR EXAM:  Ocular Alignment: abnormal and L eye depressed  Ocular ROM: No Limitations  Spontaneous Nystagmus: absent  Gaze-Induced Nystagmus: absent  Smooth Pursuits: intact and c/o mild dizziness vertically  Saccades:  mild undershooting horizontal directions, some catch up noted L eye with L saccades  Convergence/Divergence: 2-3 cm     VESTIBULAR - OCULAR REFLEX:   Slow VOR: Normal  VOR Cancellation: Normal  Head-Impulse Test: HIT Right: negative HIT Left: negative  Dynamic Visual Acuity:  NA   POSITIONAL TESTING: Right Dix-Hallpike: no nystagmus, no symptoms, "quick dizzy" upon sitting up, and Duration: NA Left Dix-Hallpike: no nystagmus Right Roll Test: no nystagmus Left Roll Test: no nystagmus   M-CTSIB  Condition 1: Firm Surface, EO 30 Sec, Normal Sway  Condition 2: Firm Surface, EC 30 Sec, Normal Sway  Condition 3: Foam Surface, EO 30 Sec, Mild Sway  Condition 4: Foam Surface, EC 30 Sec, Mild Sway      OTHOSTATICS: not done    VESTIBULAR TREATMENT:                                                                                                   DATE: 12/04/2021   PATIENT EDUCATION: Education details:  Eval results, POC; continue gaze stabilization exercises at home seated/standing and increase speed of head motions  Person educated: Patient Education method: Explanation and  Demonstration Education comprehension: verbalized understanding and returned demonstration  HOME EXERCISE PROGRAM:  (Will need to update from previous bout of therapy)  GOALS: Goals reviewed with patient? Yes  SHORT TERM GOALS: Target date: = LTGs   LONG TERM GOALS: Target date: 02/25/22   Pt will be independent with progression of HEP for improved functional mobility and decreased dizziness. Baseline:  Goal status: GOAL MET 02/11/2022  2.  Pt will improve FGA score to at least 22/30 to decrease fall risk. Baseline: 19/30>25/30 01/01/2022 Goal status: GOAL MET  3.  Pt will improve FOTO score to at least 60 to demonstrate improved overall functional mobility. Baseline: 50 at eval>58/60; 49.9750 01/28/22>61 at 02/11/2022 Goal status: GOAL MET 02/11/22  4.  Pt to report at least 60% improvement in dizziness.  Baseline: 50% >55% 02/11/2022 Goal status: GOAL PARTIALLY MET    ASSESSMENT:  CLINICAL IMPRESSION: Pt returns today after L eye cataract surgery on 02/03/22.  He reports no restrictions/precautions in the way of therapy, just hasn't been doing his HEP quite as consistently.  Reviewed updates to HEP and reviewed earlier additions to HEP, with pt return demo understanding.  Pt rates minimal symptoms of dizziness/unsteadiness up to 3/10 with VOR exercises, but no overt reports of room spinning symptoms.  Overall, pt's symptoms have improved and pt's balance and functional measures have improved during the course of therapy.  Pt has met 3 of 4 LTGs.  LTG 1, 2, and 3 met and partially met LTG 4.  He plans to continue HEP consistently in addition to his exercise routine.  He is appropriate for discharge from PT at this time.  OBJECTIVE IMPAIRMENTS: Abnormal gait, decreased balance, and dizziness.   ACTIVITY LIMITATIONS: bending, reach over head, and locomotion level  PARTICIPATION LIMITATIONS: community activity and outdoor fitness activities  PERSONAL FACTORS: Past/current  experiences and 3+ comorbidities: See PMH above  are also affecting patient's functional outcome.   REHAB POTENTIAL: Good  CLINICAL DECISION MAKING: Stable/uncomplicated  EVALUATION COMPLEXITY: Low   PLAN:  PT FREQUENCY: 1x/wk   PT DURATION: 4 weeks (per recert 01/01/2022)  PLANNED INTERVENTIONS: Therapeutic exercises, Therapeutic activity, Neuromuscular re-education, Balance training, Gait training, Patient/Family education, Self Care, Vestibular training, and Canalith repositioning  PLAN FOR NEXT SESSION: Discharge this visit    Lonia Blood, PT 02/11/22 3:12 PM Phone: [phone] Fax: [phone]   [facility] [address], [address] [place], [place] [address] Phone # [phone] Fax # [phone]

## 2022-02-16 ENCOUNTER — Other Ambulatory Visit: Payer: Self-pay | Admitting: Pharmacist Clinician (PhC)/ Clinical Pharmacy Specialist

## 2022-02-16 ENCOUNTER — Telehealth: Payer: Self-pay | Admitting: Pharmacy Technician

## 2022-02-16 NOTE — Progress Notes (Signed)
Inclisiran ordered.  Patient notified

## 2022-02-16 NOTE — Telephone Encounter (Signed)
Dr. Gwenlyn Found, Juluis Rainier note:  Auth Submission: NO AUTH NEEDED Payer: medicare a/b & aarp supp Medication & CPT/J Code(s) submitted: Leqvio (Inclisiran) J1306 Route of submission (phone, fax, portal):  Phone # Fax # Auth type: Buy/Bill Units/visits requested: X3 DOSES Reference number:  Approval from: 02/16/22 to 01/2923   Patient will be scheduled as soon as possible.  Medicare will cover 80% and aarp supp will pick-up remaining 20%. Medication is covered 100%

## 2022-02-23 ENCOUNTER — Ambulatory Visit (INDEPENDENT_AMBULATORY_CARE_PROVIDER_SITE_OTHER): Payer: Medicare Other

## 2022-02-23 VITALS — BP 116/68 | HR 55 | Temp 98.2°F | Resp 16 | Ht 69.0 in | Wt 201.4 lb

## 2022-02-23 DIAGNOSIS — E782 Mixed hyperlipidemia: Secondary | ICD-10-CM | POA: Diagnosis not present

## 2022-02-23 MED ORDER — INCLISIRAN SODIUM 284 MG/1.5ML ~~LOC~~ SOSY
284.0000 mg | PREFILLED_SYRINGE | Freq: Once | SUBCUTANEOUS | Status: AC
  Administered 2022-02-23: 284 mg via SUBCUTANEOUS
  Filled 2022-02-23: qty 1.5

## 2022-02-23 NOTE — Progress Notes (Signed)
Diagnosis: Hyperlipidemia  Provider:  Marshell Garfinkel MD  Procedure: Injection  Leqvio, Dose: 284 mg, Site: subcutaneous, Number of injections: 1  Post Care: Observation period completed  Discharge: Condition: Good, Destination: Home . AVS provided to patient.   Performed by:  Koren Shiver, RN

## 2022-02-23 NOTE — Patient Instructions (Signed)
Inclisiran Injection What is this medication? INCLISIRAN (in [place] an) treats high cholesterol. It works by decreasing bad cholesterol (such as LDL) in your blood. Changes to diet and exercise are often combined with this medication. This medicine may be used for other purposes; ask your health care provider or pharmacist if you have questions. COMMON BRAND NAME(S): LEQVIO What should I tell my care team before I take this medication? They need to know if you have any of these conditions: An unusual or allergic reaction to inclisiran, other medications, foods, dyes, or preservatives Pregnant or trying to get pregnant Breast-feeding How should I use this medication? This medication is injected under the skin. It is given by your care team in a hospital or clinic setting. Talk to your care team about the use of this medication in children. Special care may be needed. Overdosage: If you think you have taken too much of this medicine contact a poison control center or emergency room at once. NOTE: This medicine is only for you. Do not share this medicine with others. What if I miss a dose? Keep appointments for follow-up doses. It is important not to miss your dose. Call your care team if you are unable to keep an appointment. What may interact with this medication? Interactions are not expected. This list may not describe all possible interactions. Give your health care provider a list of all the medicines, herbs, non-prescription drugs, or dietary supplements you use. Also tell them if you smoke, drink alcohol, or use illegal drugs. Some items may interact with your medicine. What should I watch for while using this medication? Visit your care team for regular checks on your progress. Tell your care team if your symptoms do not start to get better or if they get worse. You may need blood work while you are taking this medication. What side effects may I notice from receiving this  medication? Side effects that you should report to your care team as soon as possible: Allergic reactions--skin rash, itching, hives, swelling of the face, lips, tongue, or throat Side effects that usually do not require medical attention (report these to your care team if they continue or are bothersome): Joint pain Pain, redness, or irritation at injection site This list may not describe all possible side effects. Call your doctor for medical advice about side effects. You may report side effects to FDA at 1-800-FDA-1088. Where should I keep my medication? This medication is given in a hospital or clinic. It will not be stored at home. NOTE: This sheet is a summary. It may not cover all possible information. If you have questions about this medicine, talk to your doctor, pharmacist, or health care provider.  2023 Elsevier/Gold Standard (2020-01-24 00:00:00)

## 2022-03-01 ENCOUNTER — Other Ambulatory Visit: Payer: Self-pay | Admitting: Cardiovascular Disease

## 2022-04-30 ENCOUNTER — Ambulatory Visit: Payer: Medicare Other | Attending: Cardiology | Admitting: Pharmacist Clinician (PhC)/ Clinical Pharmacy Specialist

## 2022-04-30 ENCOUNTER — Encounter: Payer: Self-pay | Admitting: Pharmacist Clinician (PhC)/ Clinical Pharmacy Specialist

## 2022-04-30 VITALS — BP 118/40 | HR 48

## 2022-04-30 DIAGNOSIS — I10 Essential (primary) hypertension: Secondary | ICD-10-CM | POA: Insufficient documentation

## 2022-04-30 NOTE — Progress Notes (Signed)
04/30/2022 Kevin [name] [birth year] [id number]   HPI:  Kevin [name] is a 77 y.o. male patient of Dr Allyson Sabal, with a PMH below who presents today for hypertension clinic follow up.  We have followed him for some time now, as he dealt with anxieties about checking his BP.  At one visit finally had a long discussion about his almost obsession with blood pressure readings.  His wife was with him that morning.  Patient reports that he wakes up every morning wondering if this is the day his elevated BP readings will cause him to have a stroke.  He will then take his BP and his mood for the day is based on the reading he gets.  His wife agrees with this information, stating that if his pressure is elevated, he is anxious and stressed the remainder of the day.  He agrees that it is consuming much of his thought process, and endorses that he has recently had what he can only describe as a panic attack.  Last November he had asked to come off BP medications because of "brain fog", and instead treat with natural products (beet juice).  This did not work, causing his BP to become more elevated, which then led to further anxieties and stress.  He tried sertraline, but caused him side effects, and now he is currently taking ashwaganda twice daily.  He feels that this has been quite helpful.  Blood pressure has been mostly well controlled for the past 4-6 months, and he has not been checking home numbers, as that continues to cause some anxiety.   Yesterday he called the office, noting that at another appointment his diastolic reading had dropped down to 40.  Systolic was good at 131.  He notes that this is the second time he has been at an office and had the diastolic that low.   He reports feeling fine, and has not noticed any changes with these low readings.     Blood Pressure Goal:  130/80  Current Medications:  valsartan 320 mg qhs chlorthalidone 25 mg qhs, hydralazine 50 mg tid, amlodipine 5 mg qam,  spironolactone 25 mg qd  Family Hx:  father had hypertension for many years, died after MI at age 58.  No cardiac history in mother.  One sister with hypertension.  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  has started limiting wine to 1 glass/day  Diet: mostly home cooked from scratch, no added salt; now primarily plant based diet since Jan 1   Exercise: walking most days, hiking when able. Still some vertigo/imbalance in am only  Home BP readings:  no home readings (he was asked NOT to check),   Intolerances: nkda  Labs:  04/2021:  Na 142, K 4.1, Glu 103, BUN 23, SCr 1.10, GFR 70  12/02/20:  Na 140, K 3.4, Glu 118, BUN 20, SCr 0.94 GFR >60  Wt Readings from Last 3 Encounters:  02/23/22 201 lb 6.4 oz (91.4 kg)  10/28/21 201 lb 9.6 oz (91.4 kg)  07/23/21 199 lb (90.3 kg)   BP Readings from Last 3 Encounters:  04/30/22 (!) 118/40  02/23/22 116/68  10/28/21 (!) 150/60   Pulse Readings from Last 3 Encounters:  04/30/22 (!) 48  02/23/22 (!) 55  10/28/21 (!) 57    Current Outpatient Medications  Medication Sig Dispense Refill   amLODipine (NORVASC) 5 MG tablet TAKE 1 TABLET(5 MG) BY MOUTH DAILY 30 tablet  6   amoxicillin (AMOXIL) 500 MG capsule Before dental procedures     ASHWAGANDHA PO Take 1 capsule by mouth in the morning and at bedtime.     azelastine (ASTELIN) 0.1 % nasal spray Place 1 spray into both nostrils as needed.     hydrALAZINE (APRESOLINE) 50 MG tablet Take 1 tablet (50 mg total) by mouth in the morning and at bedtime. 180 tablet 3   ipratropium (ATROVENT) 0.06 % nasal spray Place 1 spray into the nose as needed.     ketoconazole (NIZORAL) 2 % shampoo Apply 1 application topically as needed.     latanoprost (XALATAN) 0.005 % ophthalmic solution Place 1 drop into both eyes at bedtime.     Methylsulfonylmethane 1000 MG CAPS Take 2,000 mg by mouth daily.     Misc Natural Products (TART CHERRY ADVANCED PO) Take 2 capsules by mouth  daily.     omeprazole (PRILOSEC) 40 MG capsule Take 40 mg by mouth daily.     spironolactone (ALDACTONE) 25 MG tablet TAKE 1 TABLET(25 MG) BY MOUTH DAILY 90 tablet 3   SYSTANE ULTRA 0.4-0.3 % SOLN Apply 1 drop to eye 3 (three) times daily.     TURMERIC PO Take 2 capsules by mouth daily.     valsartan (DIOVAN) 320 MG tablet TAKE 1 TABLET(320 MG) BY MOUTH DAILY 30 tablet 3   White Petrolatum-Mineral Oil (SYSTANE NIGHTTIME) OINT      meloxicam (MOBIC) 7.5 MG tablet Take 7.5 mg by mouth daily as needed. (Patient not taking: Reported on 04/30/2022)     No current facility-administered medications for this visit.    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   GERD (gastroesophageal reflux disease)    History of kidney stones    x1   Hypertension    Muscle tension dysphonia     Blood pressure (!) 118/40, pulse (!) 48.    Essential hypertension Assessment: BP is controlled in office BP 118/40 mmHg;. No concerns, compliant with medications Denies SOB, palpitation, chest pain, headaches,or swelling Reiterated the importance of regular exercise and low salt diet   Plan:  Stop taking chlorthalidone 25 mg Continue taking all other medications Patient to keep record of BP readings with heart rate and report to [place] at the next visit Patient to follow up with Belenda Cruise in 3 weeks  Labs ordered today:  none   [name] PharmD CPP [facility] [address] [place], [place] [address] [phone]

## 2022-04-30 NOTE — Assessment & Plan Note (Signed)
Assessment: BP is controlled in office BP 118/40 mmHg;. No concerns, compliant with medications Denies SOB, palpitation, chest pain, headaches,or swelling Reiterated the importance of regular exercise and low salt diet   Plan:  Stop taking chlorthalidone 25 mg Continue taking all other medications Patient to keep record of BP readings with heart rate and report to [place] at the next visit Patient to follow up with Belenda Cruise in 3 weeks  Labs ordered today:  none

## 2022-04-30 NOTE — Patient Instructions (Signed)
Follow up appointment: Tuesday April 30 at 10 am  Take your BP meds as follows:  STOP CHLORTHALIDONE  Continue with all other medications  Check your blood pressure at home daily (if able) and keep record of the readings.  Hypertension "High blood pressure"  Hypertension is often called "The Silent Killer." It rarely causes symptoms until it is extremely  high or has done damage to other organs in the body. For this reason, you should have your  blood pressure checked regularly by your physician. We will check your blood pressure  every time you see a provider at one of our offices.   Your blood pressure reading consists of two numbers. Ideally, blood pressure should be  below 120/80. The first ("top") number is called the systolic pressure. It measures the  pressure in your arteries as your heart beats. The second ("bottom") number is called the diastolic pressure. It measures the pressure in your arteries as the heart relaxes between beats.  The benefits of getting your blood pressure under control are enormous. A 10-point  reduction in systolic blood pressure can reduce your risk of stroke by 27% and heart failure by 28%  Your blood pressure goal is < 130/80  To check your pressure at home you will need to:  1. Sit up in a chair, with feet flat on the floor and back supported. Do not cross your ankles or legs. 2. Rest your left arm so that the cuff is about heart level. If the cuff goes on your upper arm,  then just relax the arm on the table, arm of the chair or your lap. If you have a wrist cuff, we  suggest relaxing your wrist against your chest (think of it as Pledging the Flag with the  wrong arm).  3. Place the cuff snugly around your arm, about 1 inch above the crook of your elbow. The  cords should be inside the groove of your elbow.  4. Sit quietly, with the cuff in place, for about 5 minutes. After that 5 minutes press the power  button to start a reading. 5. Do  not talk or move while the reading is taking place.  6. Record your readings on a sheet of paper. Although most cuffs have a memory, it is often  easier to see a pattern developing when the numbers are all in front of you.  7. You can repeat the reading after 1-3 minutes if it is recommended  Make sure your bladder is empty and you have not had caffeine or tobacco within the last 30 min  Always bring your blood pressure log with you to your appointments. If you have not brought your monitor in to be double checked for accuracy, please bring it to your next appointment.  You can find a list of quality blood pressure cuffs at [email or web address]

## 2022-05-07 ENCOUNTER — Ambulatory Visit (HOSPITAL_COMMUNITY): Payer: Medicare Other | Attending: Cardiovascular Disease

## 2022-05-07 DIAGNOSIS — I351 Nonrheumatic aortic (valve) insufficiency: Secondary | ICD-10-CM

## 2022-05-07 LAB — ECHOCARDIOGRAM COMPLETE
Area-P 1/2: 2.5 cm2
P 1/2 time: 430 msec
S' Lateral: 2.7 cm

## 2022-05-19 ENCOUNTER — Ambulatory Visit: Payer: Medicare Other

## 2022-05-20 ENCOUNTER — Encounter: Payer: Self-pay | Admitting: Pharmacist Clinician (PhC)/ Clinical Pharmacy Specialist

## 2022-05-20 ENCOUNTER — Ambulatory Visit
Payer: Medicare Other | Attending: Cardiovascular Disease | Admitting: Pharmacist Clinician (PhC)/ Clinical Pharmacy Specialist

## 2022-05-20 VITALS — BP 137/68 | HR 60

## 2022-05-20 DIAGNOSIS — I10 Essential (primary) hypertension: Secondary | ICD-10-CM | POA: Diagnosis not present

## 2022-05-20 NOTE — Patient Instructions (Signed)
Follow up appointment: with Dr. Allyson Sabal as recommended  Take your BP meds as follows:  Continue with current medications, stay off the chlorthalidone.  Check your blood pressure at home daily (if able) and keep record of the readings.  Hypertension "High blood pressure"  Hypertension is often called "The Silent Killer." It rarely causes symptoms until it is extremely  high or has done damage to other organs in the body. For this reason, you should have your  blood pressure checked regularly by your physician. We will check your blood pressure  every time you see a provider at one of our offices.   Your blood pressure reading consists of two numbers. Ideally, blood pressure should be  below 120/80. The first ("top") number is called the systolic pressure. It measures the  pressure in your arteries as your heart beats. The second ("bottom") number is called the diastolic pressure. It measures the pressure in your arteries as the heart relaxes between beats.  The benefits of getting your blood pressure under control are enormous. A 10-point  reduction in systolic blood pressure can reduce your risk of stroke by 27% and heart failure by 28%  Your blood pressure goal is < 140/80  To check your pressure at home you will need to:  1. Sit up in a chair, with feet flat on the floor and back supported. Do not cross your ankles or legs. 2. Rest your left arm so that the cuff is about heart level. If the cuff goes on your upper arm,  then just relax the arm on the table, arm of the chair or your lap. If you have a wrist cuff, we  suggest relaxing your wrist against your chest (think of it as Pledging the Flag with the  wrong arm).  3. Place the cuff snugly around your arm, about 1 inch above the crook of your elbow. The  cords should be inside the groove of your elbow.  4. Sit quietly, with the cuff in place, for about 5 minutes. After that 5 minutes press the power  button to start a  reading. 5. Do not talk or move while the reading is taking place.  6. Record your readings on a sheet of paper. Although most cuffs have a memory, it is often  easier to see a pattern developing when the numbers are all in front of you.  7. You can repeat the reading after 1-3 minutes if it is recommended  Make sure your bladder is empty and you have not had caffeine or tobacco within the last 30 min  Always bring your blood pressure log with you to your appointments. If you have not brought your monitor in to be double checked for accuracy, please bring it to your next appointment.  You can find a list of quality blood pressure cuffs at [email or web address]

## 2022-05-20 NOTE — Assessment & Plan Note (Signed)
Assessment: BP is controlled in office BP 137/68 mmHg;  above the goal (<140/80). Home diastolic readings now averaging 59 Tolerates current medications well without any side effects Denies SOB, palpitation, chest pain, headaches,or swelling Reiterated the importance of regular exercise and low salt diet   Plan:  Continue taking valsartan, hydralazine, amlodipine and spironolactone Patient to monitor BP only at medical appointments; if elevated okay to check at home and report back to [place].    Patient to follow up with Dr. Allyson Sabal as recommended  Labs ordered today:  none

## 2022-05-20 NOTE — Progress Notes (Signed)
05/20/2022 Kevin [name] 21-Jul-1945 [id number]   HPI:  Kevin [name] is a 77 y.o. male patient of Dr Allyson Sabal, with a PMH below who presents today for hypertension clinic follow up.  We have followed him for some time now, as he dealt with anxieties about checking his BP.  At one visit finally had a long discussion about his almost obsession with blood pressure readings.  His wife was with him that morning.  Patient reports that he wakes up every morning wondering if this is the day his elevated BP readings will cause him to have a stroke.  He will then take his BP and his mood for the day is based on the reading he gets.  His wife agrees with this information, stating that if his pressure is elevated, he is anxious and stressed the remainder of the day.  He agrees that it is consuming much of his thought process, and endorses that he has recently had what he can only describe as a panic attack.  Last November he had asked to come off BP medications because of "brain fog", and instead treat with natural products (beet juice).  This did not work, causing his BP to become more elevated, which then led to further anxieties and stress.  He tried sertraline, but caused him side effects, and now he is currently taking ashwaganda twice daily.  He feels that this has been quite helpful.  Blood pressure has been mostly well controlled for the past 4-6 months, and he has not been checking home numbers, as that continues to cause some anxiety.   He did well for several months, noting controlled pressures when it was taken at other appointments, but several weeks ago, it was noted his diastolic reading dropped to 40.  Systolic was good at 131.  He noted that this was the second time he had been at an office and had the diastolic that low.   He reports feeling fine, and has not noticed any changes with these low readings.   I had him discontinue the chlorthalidone and asked that he follow up in a few  weeks.  Today he returns for follow up.   Has checked home BP readings a few times, with diastolic in a much better range (see below)  Blood Pressure Goal:  < 140/80  (keep systolic goal higher to avoid diastolic readings < 50)  Current Medications:  valsartan 320 mg qhs, hydralazine 50 mg tid, amlodipine 5 mg qam, spironolactone 25 mg qd  Family Hx:  father had hypertension for many years, died after MI at age 76.  No cardiac history in mother.  One sister with hypertension.  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  has started limiting wine to 1 glass/day  Diet: mostly home cooked from scratch, no added salt; now primarily plant based diet since Jan 1   Exercise: walking most days, hiking when able. Still some vertigo/imbalance in am only  Home BP readings:  10 readings over past 3 weeks - average  137/59  (range 129-140/56-62)  Intolerances: nkda  Labs:  04/2021:  Na 142, K 4.1, Glu 103, BUN 23, SCr 1.10, GFR 70  12/02/20:  Na 140, K 3.4, Glu 118, BUN 20, SCr 0.94 GFR >60  Wt Readings from Last 3 Encounters:  02/23/22 201 lb 6.4 oz (91.4 kg)  10/28/21 201 lb 9.6 oz (91.4 kg)  07/23/21 199 lb (90.3 kg)   BP  Readings from Last 3 Encounters:  05/20/22 137/68  04/30/22 (!) 118/40  02/23/22 116/68   Pulse Readings from Last 3 Encounters:  05/20/22 60  04/30/22 (!) 48  02/23/22 (!) 55    Current Outpatient Medications  Medication Sig Dispense Refill   amLODipine (NORVASC) 5 MG tablet TAKE 1 TABLET(5 MG) BY MOUTH DAILY 30 tablet 6   amoxicillin (AMOXIL) 500 MG capsule Before dental procedures     ASHWAGANDHA PO Take 1 capsule by mouth in the morning and at bedtime.     azelastine (ASTELIN) 0.1 % nasal spray Place 1 spray into both nostrils as needed.     hydrALAZINE (APRESOLINE) 50 MG tablet Take 1 tablet (50 mg total) by mouth in the morning and at bedtime. 180 tablet 3   ipratropium (ATROVENT) 0.06 % nasal spray Place 1 spray into the  nose as needed.     ketoconazole (NIZORAL) 2 % shampoo Apply 1 application topically as needed.     latanoprost (XALATAN) 0.005 % ophthalmic solution Place 1 drop into both eyes at bedtime.     meloxicam (MOBIC) 7.5 MG tablet Take 7.5 mg by mouth daily as needed. (Patient not taking: Reported on 04/30/2022)     Methylsulfonylmethane 1000 MG CAPS Take 2,000 mg by mouth daily.     Misc Natural Products (TART CHERRY ADVANCED PO) Take 2 capsules by mouth daily.     omeprazole (PRILOSEC) 40 MG capsule Take 40 mg by mouth daily.     spironolactone (ALDACTONE) 25 MG tablet TAKE 1 TABLET(25 MG) BY MOUTH DAILY 90 tablet 3   SYSTANE ULTRA 0.4-0.3 % SOLN Apply 1 drop to eye 3 (three) times daily.     TURMERIC PO Take 2 capsules by mouth daily.     valsartan (DIOVAN) 320 MG tablet TAKE 1 TABLET(320 MG) BY MOUTH DAILY 30 tablet 3   White Petrolatum-Mineral Oil (SYSTANE NIGHTTIME) OINT      No current facility-administered medications for this visit.    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   GERD (gastroesophageal reflux disease)    History of kidney stones    x1   Hypertension    Muscle tension dysphonia     Blood pressure 137/68, pulse 60.    Essential hypertension Assessment: BP is controlled in office BP 137/68 mmHg;  above the goal (<140/80). Home diastolic readings now averaging 59 Tolerates current medications well without any side effects Denies SOB, palpitation, chest  pain, headaches,or swelling Reiterated the importance of regular exercise and low salt diet   Plan:  Continue taking valsartan, hydralazine, amlodipine and spironolactone Patient to monitor BP only at medical appointments; if elevated okay to check at home and report back to [place].    Patient to follow up with Dr. Allyson Sabal as recommended  Labs ordered today:  none   [name] PharmD CPP [facility]  [facility] [address] [place], [place] [address] [phone]

## 2022-05-25 ENCOUNTER — Ambulatory Visit (INDEPENDENT_AMBULATORY_CARE_PROVIDER_SITE_OTHER): Payer: Medicare Other

## 2022-05-25 VITALS — BP 131/68 | HR 55 | Temp 97.5°F | Resp 18 | Ht 69.0 in | Wt 197.2 lb

## 2022-05-25 DIAGNOSIS — E782 Mixed hyperlipidemia: Secondary | ICD-10-CM

## 2022-05-25 MED ORDER — INCLISIRAN SODIUM 284 MG/1.5ML ~~LOC~~ SOSY
284.0000 mg | PREFILLED_SYRINGE | Freq: Once | SUBCUTANEOUS | Status: AC
  Administered 2022-05-25: 284 mg via SUBCUTANEOUS
  Filled 2022-05-25: qty 1.5

## 2022-05-25 NOTE — Progress Notes (Signed)
Diagnosis: Hyperlipidemia  Provider:  Praveen Mannam MD  Procedure: Injection  Leqvio (inclisiran), Dose: 284 mg, Site: subcutaneous, Number of injections: 1  Post Care:     Discharge: Condition: Good, Destination: Home . AVS Provided  Performed by:  Kyren Knick, RN       

## 2022-06-15 ENCOUNTER — Other Ambulatory Visit: Payer: Self-pay | Admitting: Cardiovascular Disease

## 2022-06-19 ENCOUNTER — Other Ambulatory Visit: Payer: Self-pay | Admitting: Cardiovascular Disease

## 2022-07-22 ENCOUNTER — Telehealth: Payer: Self-pay | Admitting: Pharmacist Clinician (PhC)/ Clinical Pharmacy Specialist

## 2022-07-22 DIAGNOSIS — E785 Hyperlipidemia, unspecified: Secondary | ICD-10-CM

## 2022-07-22 NOTE — Telephone Encounter (Signed)
Labs ordered for [facility] f/u

## 2022-07-30 LAB — LIPID PANEL
Chol/HDL Ratio: 3.2 ratio (ref 0.0–5.0)
Cholesterol, Total: 117 mg/dL (ref 100–199)
HDL: 37 mg/dL — ABNORMAL LOW (ref >39)
LDL Chol Calc (NIH): 46 mg/dL (ref 0–99)
Triglycerides: 214 mg/dL — ABNORMAL HIGH (ref 0–149)
VLDL Cholesterol Cal: 34 mg/dL (ref 5–40)

## 2022-07-30 LAB — HEPATIC FUNCTION PANEL
ALT: 42 IU/L (ref 0–44)
AST: 29 IU/L (ref 0–40)
Albumin: 4.4 g/dL (ref 3.8–4.8)
Alkaline Phosphatase: 56 IU/L (ref 44–121)
Bilirubin Total: 0.8 mg/dL (ref 0.0–1.2)
Bilirubin, Direct: 0.2 mg/dL (ref 0.00–0.40)
Total Protein: 6.2 g/dL (ref 6.0–8.5)

## 2022-08-04 ENCOUNTER — Encounter: Payer: Self-pay | Admitting: Pharmacist Clinician (PhC)/ Clinical Pharmacy Specialist

## 2022-08-18 ENCOUNTER — Encounter: Payer: Self-pay | Admitting: Pharmacist Clinician (PhC)/ Clinical Pharmacy Specialist

## 2022-08-19 ENCOUNTER — Encounter: Payer: Self-pay | Admitting: Pharmacist Clinician (PhC)/ Clinical Pharmacy Specialist

## 2022-08-24 ENCOUNTER — Ambulatory Visit (HOSPITAL_BASED_OUTPATIENT_CLINIC_OR_DEPARTMENT_OTHER): Payer: Medicare Other

## 2022-08-26 ENCOUNTER — Ambulatory Visit: Payer: Medicare Other | Attending: Cardiology | Admitting: Pharmacist Clinician (PhC)/ Clinical Pharmacy Specialist

## 2022-08-26 VITALS — BP 146/46

## 2022-08-26 DIAGNOSIS — I10 Essential (primary) hypertension: Secondary | ICD-10-CM | POA: Insufficient documentation

## 2022-08-26 NOTE — Progress Notes (Signed)
08/26/2022 Kevin [name] 02-Jul-1945 [id number]   HPI:  Kevin [name] is a 77 y.o. male patient of Dr Allyson Sabal, with a PMH below who presents today for hypertension clinic follow up.  We have followed him for some time now, as he dealt with anxieties about checking his BP.  At one visit finally had a long discussion about his almost obsession with blood pressure readings.  His wife was with him that morning.  Patient reports that he wakes up every morning wondering if this is the day his elevated BP readings will cause him to have a stroke.  He will then take his BP and his mood for the day is based on the reading he gets.  His wife agrees with this information, stating that if his pressure is elevated, he is anxious and stressed the remainder of the day.  He agrees that it is consuming much of his thought process, and endorses that he has recently had what he can only describe as a panic attack.  Last November he had asked to come off BP medications because of "brain fog", and instead treat with natural products (beet juice).  This did not work, causing his BP to become more elevated, which then led to further anxieties and stress.  He tried sertraline, but caused him side effects, and now he is currently taking ashwaganda twice daily.  He feels that this has been quite helpful.  Once we got his pressure back under control, he did well for some time.     In the past 6 months his pressure has been stable, but he has noticed that the diastolic reading is dropping into the 40's more and more.  He returns today to discuss this.  He notes that systolic readings have consistently 125-135, but diastolic have recently been as low as 40.   He denies SOB, edema or tightness in his chest.  He does admit to feeling sluggish and fatigued by the end of the day and still has occasional episodes of dizziness.     Blood Pressure Goal:  < 140/80  (keep systolic goal higher to avoid diastolic readings <  50)  Current Medications:  valsartan 320 mg qhs, hydralazine 50 mg bid, amlodipine 5 mg qam, spironolactone 25 mg qd  Family Hx:  father had hypertension for many years, died after MI at age 44.  No cardiac history in mother.  One sister with hypertension.  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  has started limiting wine to 1 glass/day  Diet: mostly home cooked from scratch, no added salt; now primarily plant based diet since Jan 1 ; has gone to mostly plant based diet but does till have occasional meat; has cut alcohol to 5 glasses per week, last 6 months lost 15-18 lbs  Exercise: walking most days, hiking when able, yoga, piliates     Home BP readings:  10 readings over past 3 weeks - average  137/59  (range 129-140/56-62)  Intolerances: nkda  Labs:  04/2021:  Na 142, K 4.1, Glu 103, BUN 23, SCr 1.10, GFR 70  12/02/20:  Na 140, K 3.4, Glu 118, BUN 20, SCr 0.94 GFR >60  Wt Readings from Last 3 Encounters:  05/25/22 197 lb 3.2 oz (89.4 kg)  02/23/22 201 lb 6.4 oz (91.4 kg)  10/28/21 201 lb 9.6 oz (91.4 kg)   BP Readings from Last 3 Encounters:  08/26/22 (!) 146/46  05/25/22 131/68  05/20/22 137/68   Pulse Readings from Last 3 Encounters:  05/25/22 (!) 55  05/20/22 60  04/30/22 (!) 48    Current Outpatient Medications  Medication Sig Dispense Refill   amLODipine (NORVASC) 5 MG tablet TAKE 1 TABLET(5 MG) BY MOUTH DAILY 30 tablet 5   amoxicillin (AMOXIL) 500 MG capsule Before dental procedures     ASHWAGANDHA PO Take 1 capsule by mouth in the morning and at bedtime.     azelastine (ASTELIN) 0.1 % nasal spray Place 1 spray into both nostrils as needed.     hydrALAZINE (APRESOLINE) 50 MG tablet TAKE 1 TABLET(50 MG) BY MOUTH IN THE MORNING AND AT BEDTIME 180 tablet 1   ipratropium (ATROVENT) 0.06 % nasal spray Place 1 spray into the nose as needed.     ketoconazole (NIZORAL) 2 % shampoo Apply 1 application topically as needed.     latanoprost  (XALATAN) 0.005 % ophthalmic solution Place 1 drop into both eyes at bedtime.     meloxicam (MOBIC) 7.5 MG tablet Take 7.5 mg by mouth daily as needed. (Patient not taking: Reported on 04/30/2022)     Methylsulfonylmethane 1000 MG CAPS Take 2,000 mg by mouth daily.     Misc Natural Products (TART CHERRY ADVANCED PO) Take 2 capsules by mouth daily.     omeprazole (PRILOSEC) 40 MG capsule Take 40 mg by mouth daily.     spironolactone (ALDACTONE) 25 MG tablet TAKE 1 TABLET(25 MG) BY MOUTH DAILY 90 tablet 3   SYSTANE ULTRA 0.4-0.3 % SOLN Apply 1 drop to eye 3 (three) times daily.     TURMERIC PO Take 2 capsules by mouth daily.     valsartan (DIOVAN) 320 MG tablet TAKE 1 TABLET(320 MG) BY MOUTH DAILY 30 tablet 3   White Petrolatum-Mineral Oil (SYSTANE NIGHTTIME) OINT      No current facility-administered medications for this visit.    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   GERD (gastroesophageal reflux disease)    History of kidney stones    x1   Hypertension    Muscle tension dysphonia     Blood pressure (!) 146/46.    Essential hypertension Assessment: BP is controlled in office BP 146/46 mmHg;   Diastolic hypotension - asymptomatic at this time Tolerates current medications well without any side effects Denies SOB, palpitation, chest pain, headaches,or swelling Reiterated the importance of regular exercise and low salt diet   Plan:  Stop taking hydralazine 50 mg  bid Continue taking amlodipine, valsartan and spironolactone Patient to check BP at home twice weekly, with first goal to raise diastolic and keep systolic < 150.   Patient to follow up with PharmD in 1 month  Labs ordered today:  none   [name] PharmD CPP [facility] [address] [place], [place] [address] [phone]

## 2022-08-26 NOTE — Patient Instructions (Addendum)
Follow up appointment: September 5 at 11:30  Take your BP meds as follows: stop hydralazine.   Continue all other medications  Check your blood pressure at home twice weekly, starting next week.  Send me a MyChart message with those readings.    Hypertension "High blood pressure"  Hypertension is often called "The Silent Killer." It rarely causes symptoms until it is extremely  high or has done damage to other organs in the body. For this reason, you should have your  blood pressure checked regularly by your physician. We will check your blood pressure  every time you see a provider at one of our offices.   Your blood pressure reading consists of two numbers. Ideally, blood pressure should be  below 120/80. The first ("top") number is called the systolic pressure. It measures the  pressure in your arteries as your heart beats. The second ("bottom") number is called the diastolic pressure. It measures the pressure in your arteries as the heart relaxes between beats.  The benefits of getting your blood pressure under control are enormous. A 10-point  reduction in systolic blood pressure can reduce your risk of stroke by 27% and heart failure by 28%  Your blood pressure goal is < 150/80  To check your pressure at home you will need to:  1. Sit up in a chair, with feet flat on the floor and back supported. Do not cross your ankles or legs. 2. Rest your left arm so that the cuff is about heart level. If the cuff goes on your upper arm,  then just relax the arm on the table, arm of the chair or your lap. If you have a wrist cuff, we  suggest relaxing your wrist against your chest (think of it as Pledging the Flag with the  wrong arm).  3. Place the cuff snugly around your arm, about 1 inch above the crook of your elbow. The  cords should be inside the groove of your elbow.  4. Sit quietly, with the cuff in place, for about 5 minutes. After that 5 minutes press the power  button to  start a reading. 5. Do not talk or move while the reading is taking place.  6. Record your readings on a sheet of paper. Although most cuffs have a memory, it is often  easier to see a pattern developing when the numbers are all in front of you.  7. You can repeat the reading after 1-3 minutes if it is recommended  Make sure your bladder is empty and you have not had caffeine or tobacco within the last 30 min  Always bring your blood pressure log with you to your appointments. If you have not brought your monitor in to be double checked for accuracy, please bring it to your next appointment.  You can find a list of quality blood pressure cuffs at [email or web address]

## 2022-08-26 NOTE — Assessment & Plan Note (Signed)
Assessment: BP is controlled in office BP 146/46 mmHg;   Diastolic hypotension - asymptomatic at this time Tolerates current medications well without any side effects Denies SOB, palpitation, chest pain, headaches,or swelling Reiterated the importance of regular exercise and low salt diet   Plan:  Stop taking hydralazine 50 mg bid Continue taking amlodipine, valsartan and spironolactone Patient to check BP at home twice weekly, with first goal to raise diastolic and keep systolic < 150.   Patient to follow up with PharmD in 1 month  Labs ordered today:  none

## 2022-08-28 ENCOUNTER — Encounter: Payer: Self-pay | Admitting: Pharmacist Clinician (PhC)/ Clinical Pharmacy Specialist

## 2022-09-03 ENCOUNTER — Encounter: Payer: Self-pay | Admitting: Pharmacist Clinician (PhC)/ Clinical Pharmacy Specialist

## 2022-09-17 ENCOUNTER — Encounter: Payer: Self-pay | Admitting: Pharmacist Clinician (PhC)/ Clinical Pharmacy Specialist

## 2022-09-24 ENCOUNTER — Ambulatory Visit
Payer: Medicare Other | Attending: Internal Medicine | Admitting: Pharmacist Clinician (PhC)/ Clinical Pharmacy Specialist

## 2022-09-24 VITALS — BP 132/58 | HR 58

## 2022-09-24 DIAGNOSIS — I10 Essential (primary) hypertension: Secondary | ICD-10-CM | POA: Diagnosis present

## 2022-09-24 MED ORDER — HYDRALAZINE HCL 25 MG PO TABS: 25.0000 mg | ORAL_TABLET | Freq: Three times a day (TID) | ORAL | 3 refills | Status: DC

## 2022-09-24 NOTE — Progress Notes (Signed)
09/24/2022 Kevin [name] 03-25-1945 [id number]   HPI:  Kevin [name] is a 77 y.o. male patient of Dr Allyson Sabal, with a PMH below who presents today for hypertension clinic follow up.  We have followed him for some time now, as he dealt with anxieties about checking his BP.  At one visit finally had a long discussion about his almost obsession with blood pressure readings.  His wife was with him that morning.  Patient reports that he wakes up every morning wondering if this is the day his elevated BP readings will cause him to have a stroke.  He will then take his BP and his mood for the day is based on the reading he gets.  His wife agrees with this information, stating that if his pressure is elevated, he is anxious and stressed the remainder of the day.  He agrees that it is consuming much of his thought process, and endorses that he has recently had what he can only describe as a panic attack.  Last November he had asked to come off BP medications because of "brain fog", and instead treat with natural products (beet juice).  This did not work, causing his BP to become more elevated, which then led to further anxieties and stress.  He tried sertraline, but caused him side effects, and now he is currently taking ashwaganda twice daily.  He feels that this has been quite helpful.  Once we got his pressure back under control, he did well for some time.   Several months ago he started noticing diastolic readings dropping into the 40's more and more.  Systolic has been stable and WNL for the most part.  His only symptoms were some sluggishness and fatigue by the end of the day.  He continues to have occasional episodes of dizziness.  At his last visit, we discontinued hydralazine.    Today he returns for follow up.   After stopping hydralazine at his last visit, home BP readings shot up to as hight as 162 systolic.  Diastolic readings in 60's.  Asked that he add back the hydralazine, but at 25 mg twice  daily.  Since then he has checked a few times at home as well as in other offices (see below).  He is concerned about getting medications refilled around an upcoming 2 week vacation to [place].    Blood Pressure Goal:  < 140/80  (keep systolic goal higher to avoid diastolic readings < 50)  Current Medications:  valsartan 320 mg qhs, hydralazine 25 mg bid, amlodipine 5 mg qam, spironolactone 25 mg qd  Family Hx:  father had hypertension for many years, died after MI at age 1.  No cardiac history in mother.  One sister with hypertension.  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  has started limiting wine to 1 glass/day  Diet: mostly home cooked from scratch, no added salt; has gone to mostly plant based diet but does till have occasional meat; has cut alcohol to 5 glasses per week, last 6 months lost 15-18 lbs  Exercise: walking most days, hiking when able, yoga, piliates     Home BP readings:  home readings 130-140/60-70  Intolerances: nkda  Labs:  04/2021:  Na 142, K 4.1, Glu 103, BUN 23, SCr 1.10, GFR 70  12/02/20:  Na 140, K 3.4, Glu 118, BUN 20, SCr 0.94 GFR >60  Wt Readings from Last 3 Encounters:  05/25/22 197 lb  3.2 oz (89.4 kg)  02/23/22 201 lb 6.4 oz (91.4 kg)  10/28/21 201 lb 9.6 oz (91.4 kg)   BP Readings from Last 3 Encounters:  09/24/22 (!) 132/58  08/26/22 (!) 146/46  05/25/22 131/68   Pulse Readings from Last 3 Encounters:  09/24/22 (!) 58  05/25/22 (!) 55  05/20/22 60    Current Outpatient Medications  Medication Sig Dispense Refill   hydrALAZINE (APRESOLINE) 25 MG tablet Take 1 tablet (25 mg total) by mouth 3 (three) times daily. 270 tablet 3   amLODipine (NORVASC) 5 MG tablet TAKE 1 TABLET(5 MG) BY MOUTH DAILY 30 tablet 5   amoxicillin (AMOXIL) 500 MG capsule Before dental procedures     ASHWAGANDHA PO Take 1 capsule by mouth in the morning and at bedtime.     azelastine (ASTELIN) 0.1 % nasal spray Place 1 spray into  both nostrils as needed.     ipratropium (ATROVENT) 0.06 % nasal spray Place 1 spray into the nose as needed.     ketoconazole (NIZORAL) 2 % shampoo Apply 1 application topically as needed.     latanoprost (XALATAN) 0.005 % ophthalmic solution Place 1 drop into both eyes at bedtime.     meloxicam (MOBIC) 7.5 MG tablet Take 7.5 mg by mouth daily as needed. (Patient not taking: Reported on 04/30/2022)     Methylsulfonylmethane 1000 MG CAPS Take 2,000 mg by mouth daily.     Misc Natural Products (TART CHERRY ADVANCED PO) Take 2 capsules by mouth daily.     omeprazole (PRILOSEC) 40 MG capsule Take 40 mg by mouth daily.     spironolactone (ALDACTONE) 25 MG tablet TAKE 1 TABLET(25 MG) BY MOUTH DAILY 90 tablet 3   SYSTANE ULTRA 0.4-0.3 % SOLN Apply 1 drop to eye 3 (three) times daily.     TURMERIC PO Take 2 capsules by mouth daily.     valsartan (DIOVAN) 320 MG tablet TAKE 1 TABLET(320 MG) BY MOUTH DAILY 30 tablet 3   White Petrolatum-Mineral Oil (SYSTANE NIGHTTIME) OINT      No current facility-administered medications for this visit.    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   GERD (gastroesophageal reflux disease)    History of kidney stones    x1   Hypertension    Muscle tension dysphonia     Blood pressure (!) 132/58, pulse (!) 58.    Essential hypertension Assessment: BP is controlled in office BP 132/58 mmHg;   Will allow systolic BP to 140, in order to keep diastolic  > 50 Tolerates current medications well without any side effects Denies SOB, palpitation, chest pain, headaches,or swelling Reiterated the importance of regular exercise and low salt diet   Plan:  Continue taking current medications Patient to monitor BP only in medical offices, home testing leads to anxieties Patient to follow up with Dr. Allyson Sabal in 6 weeks  Labs ordered today:  none   [name] PharmD CPP [facility] [address] [place], [place] [address] [phone]

## 2022-09-24 NOTE — Patient Instructions (Signed)
Follow up appointment: Dr. Allyson Sabal in October  Take your BP meds as follows: no changes to medications today  Check your blood pressure at home daily (if able) and keep record of the readings.  Hypertension "High blood pressure"  Hypertension is often called "The Silent Killer." It rarely causes symptoms until it is extremely  high or has done damage to other organs in the body. For this reason, you should have your  blood pressure checked regularly by your physician. We will check your blood pressure  every time you see a provider at one of our offices.   Your blood pressure reading consists of two numbers. Ideally, blood pressure should be  below 120/80. The first ("top") number is called the systolic pressure. It measures the  pressure in your arteries as your heart beats. The second ("bottom") number is called the diastolic pressure. It measures the pressure in your arteries as the heart relaxes between beats.  The benefits of getting your blood pressure under control are enormous. A 10-point  reduction in systolic blood pressure can reduce your risk of stroke by 27% and heart failure by 28%  Your blood pressure goal is <150 systolic; > 50 diastolic  To check your pressure at home you will need to:  1. Sit up in a chair, with feet flat on the floor and back supported. Do not cross your ankles or legs. 2. Rest your left arm so that the cuff is about heart level. If the cuff goes on your upper arm,  then just relax the arm on the table, arm of the chair or your lap. If you have a wrist cuff, we  suggest relaxing your wrist against your chest (think of it as Pledging the Flag with the  wrong arm).  3. Place the cuff snugly around your arm, about 1 inch above the crook of your elbow. The  cords should be inside the groove of your elbow.  4. Sit quietly, with the cuff in place, for about 5 minutes. After that 5 minutes press the power  button to start a reading. 5. Do not talk or  move while the reading is taking place.  6. Record your readings on a sheet of paper. Although most cuffs have a memory, it is often  easier to see a pattern developing when the numbers are all in front of you.  7. You can repeat the reading after 1-3 minutes if it is recommended  Make sure your bladder is empty and you have not had caffeine or tobacco within the last 30 min  Always bring your blood pressure log with you to your appointments. If you have not brought your monitor in to be double checked for accuracy, please bring it to your next appointment.  You can find a list of quality blood pressure cuffs at validatebp.org

## 2022-09-24 NOTE — Assessment & Plan Note (Signed)
Assessment: BP is controlled in office BP 132/58 mmHg;   Will allow systolic BP to 140, in order to keep diastolic > 50 Tolerates current medications well without any side effects Denies SOB, palpitation, chest pain, headaches,or swelling Reiterated the importance of regular exercise and low salt diet   Plan:  Continue taking current medications Patient to monitor BP only in medical offices, home testing leads to anxieties Patient to follow up with Dr. Allyson Sabal in 6 weeks  Labs ordered today:  none

## 2022-11-13 ENCOUNTER — Ambulatory Visit: Payer: Medicare Other | Attending: Cardiovascular Disease | Admitting: Cardiovascular Disease

## 2022-11-13 ENCOUNTER — Encounter: Payer: Self-pay | Admitting: Cardiovascular Disease

## 2022-11-13 VITALS — BP 148/86 | HR 61 | Ht 69.0 in | Wt 191.4 lb

## 2022-11-13 DIAGNOSIS — R931 Abnormal findings on diagnostic imaging of heart and coronary circulation: Secondary | ICD-10-CM | POA: Diagnosis present

## 2022-11-13 DIAGNOSIS — I351 Nonrheumatic aortic (valve) insufficiency: Secondary | ICD-10-CM

## 2022-11-13 DIAGNOSIS — I10 Essential (primary) hypertension: Secondary | ICD-10-CM | POA: Diagnosis present

## 2022-11-13 DIAGNOSIS — E785 Hyperlipidemia, unspecified: Secondary | ICD-10-CM | POA: Diagnosis present

## 2022-11-13 NOTE — Progress Notes (Signed)
11/13/2022 Kevin [name]   07/11/1945  [id number]  Primary Physician Kevin Bussing, MD Primary Cardiologist: Kevin Gess MD Kevin [name], [place]  HPI:  Kevin [name] is a 77 y.o.  mildly overweight married Caucasian male who works at [employer]. I last saw him in the office 10/28/2021.  He is accompanied by his wife Kevin [name] today. His history otherwise is remarkable for multiple joint replacements as well as prostate resection because of prostate surgery, with ureteral complications. His ACE inhibitor was recently changed to an ARB (losartan) because of a hoarse voice. He probably needs vocal cord surgery next week. He was seen at [facility] today for preoperative evaluation with blood pressure was measured multiple times in the 190/90 range. Since I saw him in the office we have been titrating his blood pressure medicines to the point now that his blood pressure is much better controlled in the 130/6070 range. He currently is on hydralazine, Benicar andamlodipine. He denies chest pain or shortness of breath. He did have some vague ocular phenomenon for which he saw his ophthalmologist. 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 I referred him to Dr. Pearlean [name] for neurologic evaluation. An MRI/MRA was unremarkable. His blood pressure has been under much better control on his current medical regimen. He follows his blood pressure as an outpatient at home. He has had issues with morning mucus production and coughing/gagging for the last 12 months or so. This has been evaluated by ENT, gastric neurology and allergy. There has been no uniform consensus on the etiology.  This mucus production has somewhat improved over last several months. He is otherwise asymptomatic with excellent blood pressure measurements at home on minimal medications.   Since I saw him in the office a year ago he continues to do well.  His hoarseness has improved.  His blood pressures under better  control.  He is very active and walks 3 to 5 miles a day, does yoga and bikes.  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  He recently got back from visiting [place] with his wife.  He is completely asymptomatic.  His most recent 2D echo performed 05/07/2022 revealed normal LV systolic function.  He has moderate to severe aortic insufficiency.  His aortic root is not dilated.  His end-diastolic dimension is unchanged at 53 mm.  He did have a repeat coronary calcium score performed 02/02/2022 which showed increased from the 500 range up to 1049 although he is on Leqvio with an LDL of 43, at goal for secondary prevention and is completely asymptomatic.   Current Meds  Medication Sig   amLODipine (NORVASC) 5 MG tablet TAKE 1 TABLET(5 MG) BY MOUTH DAILY   amoxicillin (AMOXIL) 500 MG capsule Before dental procedures   azelastine (ASTELIN) 0.1 % nasal spray Place 1 spray into both nostrils as needed.   hydrALAZINE (APRESOLINE) 25 MG tablet Take 1 tablet (25 mg total) by mouth 3 (three) times daily.   latanoprost (XALATAN) 0.005 % ophthalmic solution Place 1 drop into both eyes at bedtime.   Methylsulfonylmethane 1000 MG CAPS Take 2,000 mg by mouth daily.   Misc Natural Products (TART CHERRY ADVANCED PO) Take 2 capsules by mouth daily.   omeprazole (PRILOSEC) 40 MG capsule Take 40 mg by mouth daily.   spironolactone (ALDACTONE) 25 MG tablet TAKE 1 TABLET(25 MG) BY MOUTH DAILY   SYSTANE ULTRA 0.4-0.3 % SOLN Apply 1 drop to eye 3 (three) times daily.  TURMERIC PO Take 2 capsules by mouth daily.   valsartan (DIOVAN) 320 MG tablet TAKE 1 TABLET(320 MG) BY MOUTH DAILY   White Petrolatum-Mineral Oil (SYSTANE NIGHTTIME) OINT      Allergies  Allergen Reactions   Crestor [Rosuvastatin]     Myalgias   Zoloft [Sertraline]     Panic attacks    Social History   Socioeconomic History   Marital status: Married    Spouse name: Not on file   Number of children: 2   Years of education: College gr   Highest  education level: Not on file  Occupational History   Occupation: Scientist, research (medical)  Tobacco Use   Smoking status: Never   Smokeless tobacco: Never  Vaping Use   Vaping status: Never Used  Substance and Sexual Activity   Alcohol use: Yes    Comment: social 1-2 drinks weekly   Drug use: No   Sexual activity: Yes  Other Topics Concern   Not on file  Social History Narrative   Right handed   Lives with wife two story home bedroom is on first level   Caffeine 2 cups of half and half               Social Determinants of Health   Financial Resource Strain: Not on file  Food Insecurity: Low Risk  (10/28/2022)   Received from [facility]   Hunger Vital Sign    Worried About Running Out of Food in the Last Year: Never true    Ran Out of Food in the Last Year: Never true  Transportation Needs: No Transportation Needs (10/28/2022)   Received from [facility]    In the past 12 months, has lack of reliable transportation kept you from medical appointments, meetings, work or from getting things needed for daily living? : No  Physical Activity: Not on file  Stress: Not on file  Social Connections: Not on file  Intimate Partner Violence: Not on file     Review of Systems: General: negative for chills, fever, night sweats or weight changes.  Cardiovascular: negative for chest pain, dyspnea on exertion, edema, orthopnea, palpitations, paroxysmal nocturnal dyspnea or shortness of breath Dermatological: negative for rash Respiratory: negative for cough or wheezing Urologic: negative for hematuria Abdominal: negative for nausea, vomiting, diarrhea, bright red blood per rectum, melena, or hematemesis Neurologic: negative for visual changes, syncope, or dizziness All other systems reviewed and are otherwise negative except as noted above.    Blood pressure (!) 148/86, pulse 61, height 5\' 9"  (1.753 m), weight 191 lb 6.4 oz (86.8 kg), SpO2 97%.  General appearance: alert  and no distress Neck: no adenopathy, no carotid bruit, no JVD, supple, symmetrical, trachea midline, and thyroid not enlarged, symmetric, no tenderness/mass/nodules Lungs: clear to auscultation bilaterally Heart: 2/6 diastolic blow at the left upper sternal border consistent with aortic insufficiency. Extremities: extremities normal, atraumatic, no cyanosis or edema Pulses: 2+ and symmetric Skin: Skin color, texture, turgor normal. No rashes or lesions Neurologic: Grossly normal  EKG EKG Interpretation Date/Time:  Friday November 13 2022 14:20:53 EDT Ventricular Rate:  61 PR Interval:  218 QRS Duration:  120 QT Interval:  424 QTC Calculation: 426 R Axis:   -24  Text Interpretation: Sinus rhythm with 1st degree A-V block Left ventricular hypertrophy with QRS widening ( R in aVL , Cornell product ) Nonspecific ST abnormality When compared with ECG of 16-Feb-2021 07:50, PREVIOUS ECG IS PRESENT Confirmed by Nanetta Batty [phone]) on 11/13/2022  2:28:16 PM    ASSESSMENT AND PLAN:   Essential hypertension History of essential hypertension with blood pressure measured today at 148/86.  This is higher than it usually runs.  He is on amlodipine, spironolactone, hydralazine and valsartan.  His systolic blood pressure is usually fairly low probably explainable by his aortic insufficiency.  Moderate aortic insufficiency History of moderate to severe aortic insufficiency by 2D echo performed/18/24.  His LV function was normal and his LV internal cavity size was only mildly elevated.  He had mild basal septal hypertrophy.  His estimated RV systolic pressure was 40 mmHg.  He is very active and completely asymptomatic.  At this point, we will continue to follow him noninvasively.  Should his LV cavitary size continue to increase and/or his LV function decrease, or he becomes symptomatic we would need to perform right and left heart cath potential aortic valve replacement.  Hyperlipidemia History of  hyperlipidemia on Leqvio with lipid profile performed 09/16/2022 revealing a total cholesterol 125, LDL 43 and HDL 39.  Elevated coronary artery calcium score Elevated coronary calcium score which was 537 when measured on 05/07/2017.  It had increased to 1049 02/03/2022.  He did have a negative Myoview back in 2019.  He is very active and asymptomatic.  His LDL is 43.  At this point, given his lack of symptoms.  I do not feel compelled to perform a functional study.     Kevin Gess MD FACP,FACC,FAHA, [facility] 11/13/2022 3:04 PM

## 2022-11-13 NOTE — Assessment & Plan Note (Signed)
Elevated coronary calcium score which was 537 when measured on 05/07/2017.  It had increased to 1049 02/03/2022.  He did have a negative Myoview back in 2019.  He is very active and asymptomatic.  His LDL is 43.  At this point, given his lack of symptoms.  I do not feel compelled to perform a functional study.

## 2022-11-13 NOTE — Assessment & Plan Note (Signed)
History of hyperlipidemia on Leqvio with lipid profile performed 09/16/2022 revealing a total cholesterol 125, LDL 43 and HDL 39.

## 2022-11-13 NOTE — Patient Instructions (Addendum)
Medication Instructions:  Your physician recommends that you continue on your current medications as directed. Please refer to the Current Medication list given to you today.  *If you need a refill on your cardiac medications before your next appointment, please call your pharmacy*   Testing/Procedures: Your physician has requested that you have an echocardiogram. Echocardiography is a painless test that uses sound waves to create images of your heart. It provides your doctor with information about the size and shape of your heart and how well your heart's chambers and valves are working. This procedure takes approximately one hour. There are no restrictions for this procedure. Please do NOT wear cologne, perfume, aftershave, or lotions (deodorant is allowed). Please arrive 15 minutes prior to your appointment time. This will take place at [address] [place] **To do in Early April**    Follow-Up: At [facility], you and your health needs are our priority.  As part of our continuing mission to provide you with exceptional heart care, we have created designated Provider Care Teams.  These Care Teams include your primary Cardiologist (physician) and Advanced Practice Providers (APPs -  Physician Assistants and Nurse Practitioners) who all work together to provide you with the care you need, when you need it.  We recommend signing up for the patient portal called "MyChart".  Sign up information is provided on this After Visit Summary.  MyChart is used to connect with patients for Virtual Visits (Telemedicine).  Patients are able to view lab/test results, encounter notes, upcoming appointments, etc.  Non-urgent messages can be sent to your provider as well.   To learn more about what you can do with MyChart, go to [email or web address].    Your next appointment:   12 month(s)  Provider:   Nanetta Batty, MD

## 2022-11-13 NOTE — Assessment & Plan Note (Signed)
History of moderate to severe aortic insufficiency by 2D echo performed/18/24.  His LV function was normal and his LV internal cavity size was only mildly elevated.  He had mild basal septal hypertrophy.  His estimated RV systolic pressure was 40 mmHg.  He is very active and completely asymptomatic.  At this point, we will continue to follow him noninvasively.  Should his LV cavitary size continue to increase and/or his LV function decrease, or he becomes symptomatic we would need to perform right and left heart cath potential aortic valve replacement.

## 2022-11-13 NOTE — Assessment & Plan Note (Signed)
History of essential hypertension with blood pressure measured today at 148/86.  This is higher than it usually runs.  He is on amlodipine, spironolactone, hydralazine and valsartan.  His systolic blood pressure is usually fairly low probably explainable by his aortic insufficiency.

## 2022-11-25 ENCOUNTER — Ambulatory Visit: Payer: Medicare Other

## 2022-11-25 VITALS — BP 158/67 | HR 69 | Temp 97.5°F | Resp 18 | Ht 69.0 in | Wt 192.4 lb

## 2022-11-25 DIAGNOSIS — E782 Mixed hyperlipidemia: Secondary | ICD-10-CM

## 2022-11-25 MED ORDER — INCLISIRAN SODIUM 284 MG/1.5ML ~~LOC~~ SOSY
284.0000 mg | PREFILLED_SYRINGE | Freq: Once | SUBCUTANEOUS | Status: AC
  Administered 2022-11-25: 284 mg via SUBCUTANEOUS
  Filled 2022-11-25: qty 1.5

## 2022-11-25 NOTE — Progress Notes (Signed)
Diagnosis: Hyperlipidemia  Provider:  Chilton Greathouse MD  Procedure: Injection  Leqvio (inclisiran), Dose: 284 mg, Site: subcutaneous, Number of injections: 1  Post Care:  left arm injection  Discharge: Condition: Good, Destination: Home . AVS Provided  Performed by:  Rico Ala, LPN

## 2022-12-11 ENCOUNTER — Telehealth: Payer: Self-pay

## 2022-12-11 ENCOUNTER — Ambulatory Visit: Payer: Medicare Other | Attending: Cardiovascular Disease

## 2022-12-11 DIAGNOSIS — R002 Palpitations: Secondary | ICD-10-CM

## 2022-12-11 DIAGNOSIS — R5383 Other fatigue: Secondary | ICD-10-CM

## 2022-12-11 NOTE — Telephone Encounter (Signed)
Spoke to pt regarding recent episodes of fatigue. Per message from Dr. Allyson Sabal, would like to order a 14 day zio monitor for the pt. Explained to pt that monitor order has been placed and should arrive in 3-5 business days. Will send instructions via mychart. Pt verbalizes understanding.

## 2022-12-11 NOTE — Progress Notes (Unsigned)
Enrolled patient for a 14 day Zio XT  monitor to be mailed to patients home  °

## 2022-12-25 ENCOUNTER — Encounter: Payer: Self-pay | Admitting: Pharmacist Clinician (PhC)/ Clinical Pharmacy Specialist

## 2022-12-25 ENCOUNTER — Other Ambulatory Visit: Payer: Self-pay | Admitting: Cardiovascular Disease

## 2022-12-28 DIAGNOSIS — R002 Palpitations: Secondary | ICD-10-CM

## 2023-01-03 ENCOUNTER — Other Ambulatory Visit: Payer: Self-pay | Admitting: Cardiovascular Disease

## 2023-01-24 ENCOUNTER — Other Ambulatory Visit: Payer: Self-pay | Admitting: Cardiovascular Disease

## 2023-01-25 ENCOUNTER — Encounter: Payer: Self-pay | Admitting: Pharmacist Clinician (PhC)/ Clinical Pharmacy Specialist

## 2023-02-02 ENCOUNTER — Telehealth: Payer: Self-pay

## 2023-02-02 NOTE — Telephone Encounter (Signed)
 Auth Submission: NO AUTH NEEDED Payer: medicare a/b & aarp supp Medication & CPT/J Code(s) submitted: Leqvio  (Inclisiran) J1306 Route of submission (phone, fax, portal):  Phone # Fax # Auth type: Buy/Bill Units/visits requested: 284mg  x 2 DOSES Reference number:  Approval from: 02/16/22 to 02/19/24   Medicare will cover 80% and aarp supp will pick-up remaining 20%

## 2023-02-18 ENCOUNTER — Encounter: Payer: Self-pay | Admitting: Pharmacist Clinician (PhC)/ Clinical Pharmacy Specialist

## 2023-02-22 ENCOUNTER — Other Ambulatory Visit (HOSPITAL_COMMUNITY): Payer: Self-pay

## 2023-02-22 ENCOUNTER — Encounter: Payer: Self-pay | Admitting: Cardiovascular Disease

## 2023-02-22 ENCOUNTER — Encounter: Payer: Self-pay | Admitting: Pharmacist Clinician (PhC)/ Clinical Pharmacy Specialist

## 2023-02-22 MED ORDER — HYDRALAZINE HCL 25 MG PO TABS: 25.0000 mg | ORAL_TABLET | Freq: Three times a day (TID) | ORAL | 3 refills | Status: DC

## 2023-02-22 MED ORDER — HYDRALAZINE HCL 25 MG PO TABS
25.0000 mg | ORAL_TABLET | Freq: Three times a day (TID) | ORAL | 3 refills | Status: DC
  Filled 2023-02-22: qty 270, 90d supply, fill #0

## 2023-02-22 NOTE — Addendum Note (Signed)
Addended by: Rosalee Kaufman on: 02/22/2023 03:13 PM   Modules accepted: Orders

## 2023-04-27 ENCOUNTER — Ambulatory Visit (HOSPITAL_COMMUNITY): Payer: Medicare Other | Attending: Cardiovascular Disease

## 2023-04-27 DIAGNOSIS — I10 Essential (primary) hypertension: Secondary | ICD-10-CM | POA: Diagnosis not present

## 2023-04-27 DIAGNOSIS — E785 Hyperlipidemia, unspecified: Secondary | ICD-10-CM | POA: Diagnosis present

## 2023-04-27 DIAGNOSIS — I351 Nonrheumatic aortic (valve) insufficiency: Secondary | ICD-10-CM | POA: Diagnosis present

## 2023-04-27 LAB — ECHOCARDIOGRAM COMPLETE
AR max vel: 2.57 cm2
AV Area VTI: 2.43 cm2
AV Area mean vel: 2.51 cm2
AV Mean grad: 6 mmHg
AV Peak grad: 10.4 mmHg
Ao pk vel: 1.61 m/s
Area-P 1/2: 2.5 cm2
P 1/2 time: 365 ms
S' Lateral: 3.3 cm

## 2023-04-28 ENCOUNTER — Other Ambulatory Visit (HOSPITAL_COMMUNITY): Payer: Self-pay | Admitting: *Deleted

## 2023-04-28 DIAGNOSIS — I351 Nonrheumatic aortic (valve) insufficiency: Secondary | ICD-10-CM

## 2023-04-29 ENCOUNTER — Other Ambulatory Visit: Payer: Self-pay | Admitting: *Deleted

## 2023-04-29 DIAGNOSIS — I351 Nonrheumatic aortic (valve) insufficiency: Secondary | ICD-10-CM

## 2023-05-26 ENCOUNTER — Ambulatory Visit: Payer: Medicare Other

## 2023-05-26 VITALS — BP 144/62 | HR 60 | Temp 98.5°F | Resp 16 | Ht 69.0 in | Wt 196.6 lb

## 2023-05-26 DIAGNOSIS — E782 Mixed hyperlipidemia: Secondary | ICD-10-CM | POA: Diagnosis not present

## 2023-05-26 MED ORDER — INCLISIRAN SODIUM 284 MG/1.5ML ~~LOC~~ SOSY
284.0000 mg | PREFILLED_SYRINGE | Freq: Once | SUBCUTANEOUS | Status: AC
  Administered 2023-05-26: 284 mg via SUBCUTANEOUS
  Filled 2023-05-26: qty 1.5

## 2023-05-26 NOTE — Progress Notes (Signed)
 Diagnosis: Hyperlipidemia  Provider:  Chilton Greathouse MD  Procedure: Injection  Leqvio (inclisiran), Dose: 284 mg, Site: subcutaneous, Number of injections: 1  Injection Site(s): Left arm  Post Care: Patient declined observation  Discharge: Condition: Good, Destination: Home . AVS Provided  Performed by:  Loney Hering, LPN

## 2023-06-23 ENCOUNTER — Ambulatory Visit (INDEPENDENT_AMBULATORY_CARE_PROVIDER_SITE_OTHER): Admitting: Otolaryngology

## 2023-07-04 ENCOUNTER — Other Ambulatory Visit: Payer: Self-pay | Admitting: Cardiovascular Disease

## 2023-10-18 ENCOUNTER — Other Ambulatory Visit: Payer: Self-pay | Admitting: Family Medicine

## 2023-10-18 DIAGNOSIS — I1A0 Resistant hypertension: Secondary | ICD-10-CM

## 2023-10-26 ENCOUNTER — Ambulatory Visit
Admission: RE | Admit: 2023-10-26 | Discharge: 2023-10-26 | Disposition: A | Discharge status: Home / Self Care | Source: Ambulatory Visit | Attending: Family Medicine | Admitting: Family Medicine

## 2023-10-26 DIAGNOSIS — I1A0 Resistant hypertension: Secondary | ICD-10-CM

## 2023-10-27 ENCOUNTER — Other Ambulatory Visit: Payer: Self-pay

## 2023-10-27 ENCOUNTER — Ambulatory Visit: Attending: Family Medicine | Admitting: Physical Therapy

## 2023-10-27 ENCOUNTER — Encounter: Payer: Self-pay | Admitting: Physical Therapy

## 2023-10-27 DIAGNOSIS — H8112 Benign paroxysmal vertigo, left ear: Secondary | ICD-10-CM | POA: Insufficient documentation

## 2023-10-27 DIAGNOSIS — R42 Dizziness and giddiness: Secondary | ICD-10-CM | POA: Insufficient documentation

## 2023-10-27 DIAGNOSIS — R2681 Unsteadiness on feet: Secondary | ICD-10-CM | POA: Insufficient documentation

## 2023-10-27 NOTE — Therapy (Signed)
 OUTPATIENT PHYSICAL THERAPY VESTIBULAR EVALUATION     Patient Name: Kevin [name] MRN: [id number] DOB:04-Jun-1945, 78 y.o., male Today's Date: 10/27/2023  END OF SESSION:  PT End of Session - 10/27/23 1331     Visit Number 1    Number of Visits 13    Date for Recertification  12/10/23    Authorization Type Medicare/AARP    Progress Note Due on Visit 10    PT Start Time 0935    PT Stop Time 1021    PT Time Calculation (min) 46 min    Activity Tolerance Patient tolerated treatment well    Behavior During Therapy [facility] for tasks assessed/performed          Past Medical History:  Diagnosis Date   Arthritis    osteoarthritis-hips/ shoulders- no issues at present   Complication of anesthesia    longer to wake up   Foley catheter in place 05/30/2013   PT HAD SURG 05/30/13 - CYSTO AND CLOT EVACUATION BLADDER / PROSTATE - UNABLE TO VOID AFTER DISCHARGE - CAME BACK TO ER SAME NIGHT AND HAD FOLEY INSERTED TO DRAINAGE BAG - URINE WAS BLOODY BUT PT REPORTS ON 06/07/13 THAT URINE IN BAG MOSTLY CLEAR NOW.   GERD (gastroesophageal reflux disease)    History of kidney stones    x1   Hypertension    Muscle tension dysphonia    Past Surgical History:  Procedure Laterality Date   BACK SURGERY     lumbar    CYSTOSCOPY W/ URETEROSCOPY W/ LITHOTRIPSY     CYSTOSCOPY/RETROGRADE/URETEROSCOPY/STONE EXTRACTION WITH BASKET N/A 05/30/2013   Procedure: CYSTOSCOPY , BLADDER BIOPSY AND CLOT EVACUATION , CAUTERIZATIO OF LATERAL RIGHT AND LEFT LOBE OF PROSTATE;  Surgeon: Arlena LILLETTE Gal, MD;  Location: [facility];  Service: Urology;  Laterality: N/A;   HERNIA REPAIR     inguinal   INSERTION OF MESH N/A 11/24/2017   Procedure: INSERTION OF MESH;  Surgeon: Belinda Cough, MD;  Location: [facility];  Service: General;  Laterality: N/A;   JOINT REPLACEMENT     '00-left/'08 RTHA   KNEE ARTHROSCOPY     left knee meniscus surgery   PROSTATE SURGERY Bilateral    7- 8 yrs ago   SHOULDER ARTHROSCOPY Right     SHOULDER ARTHROTOMY Left    TRANSURETHRAL RESECTION OF PROSTATE N/A 06/09/2013   Procedure: TRANSURETHRAL RESECTION OF THE PROSTATE (TURP) WITH GYRUS;  Surgeon: Arlena LILLETTE Gal, MD;  Location: [facility];  Service: Urology;  Laterality: N/A;   UMBILICAL HERNIA REPAIR N/A 11/24/2017   Procedure: UMBILICAL HERNIA REPAIR WITH MESH;  Surgeon: Belinda Cough, MD;  Location: [facility];  Service: General;  Laterality: N/A;   Patient Active Problem List   Diagnosis Date Noted   Elevated coronary artery calcium  score 10/28/2021   Hyperlipidemia 05/26/2021   Arthritis 12/18/2020   Palpitations 04/27/2017   Moderate aortic insufficiency 04/27/2017   Blurred vision, bilateral 05/31/2014   Essential hypertension 01/25/2014   Benign prostatic hyperplasia 06/09/2013    PCP: Regino Slater, MD  REFERRING PROVIDER: Regino Slater, MD   REFERRING DIAG: H81.399 (ICD-10-CM) - Other peripheral vertigo, unspecified ear   THERAPY DIAG:  Dizziness and giddiness  BPPV (benign paroxysmal positional vertigo), left  Unsteadiness on feet  ONSET DATE: 10/13/2023 (MD referral)  Rationale for Evaluation and Treatment: Rehabilitation  SUBJECTIVE:   SUBJECTIVE STATEMENT: Had been doing well.  Over the past 5-6 weeks, the dizziness has returned.  When I was here 2 years ago, I had the  symptoms, had therapy and did the exercises religiously.  For the past several months, I feel more acute in my symptoms.  Doesn't feel like vertigo/room spinning-it feels more like loss of balance, veering, like I'm drunk.  Just doesn't feel right Pt accompanied by: self  PERTINENT HISTORY: muscle tension dysphonia (seeing speech therapist), arthritis, HTN  PAIN:  Are you having pain? Hx of back pain for years-under control  PRECAUTIONS: Fall  RED FLAGS: None   WEIGHT BEARING RESTRICTIONS: No  FALLS: Has patient fallen in last 6 months? No close calls  LIVING ENVIRONMENT: Lives with: lives with their spouse Lives in:  House/apartment  PLOF: IndependentDoes stretching, yoga  PATIENT GOALS: To try to get the dizziness better  OBJECTIVE:  Note: Objective measures were completed at Evaluation unless otherwise noted.  DIAGNOSTIC FINDINGS: NA for this episode  COGNITION: Overall cognitive status: Within functional limits for tasks assessed   SENSATION: WFL  POSTURE:  rounded shoulders  Cervical ROM:    Active A/PROM (deg) eval  Flexion 50  Extension 35  Right lateral flexion   Left lateral flexion   Right rotation 60  Left rotation 55  (Blank rows = not tested)   BED MOBILITY:  Independent  TRANSFERS: Assistive device utilized: None  Sit to stand: Modified independence Stand to sit: Modified independence   GAIT: Gait pattern: step through pattern Distance walked: 50 ft x 2 Assistive device utilized: None Level of assistance: Modified independence Comments: Reports feeling like he is off-balance   PATIENT SURVEYS:  DHI: THE DIZZINESS HANDICAP INVENTORY (DHI)  P1. Does looking up increase your problem? 4 = Yes  E2. Because of your problem, do you feel frustrated? 4 = Yes  F3. Because of your problem, do you restrict your travel for business or recreation?  0 = No  P4. Does walking down the aisle of a supermarket increase your problems?  0 = No  F5. Because of your problem, do you have difficulty getting into or out of bed?  0 = No  F6. Does your problem significantly restrict your participation in social activities, such as going out to dinner, going to the movies, dancing, or going to parties? 2 = Sometimes  F7. Because of your problem, do you have difficulty reading?  0 = No  P8. Does performing more ambitious activities such as sports, dancing, household chores (sweeping or putting dishes away) increase your problems?  2 = Sometimes  E9. Because of your problem, are you afraid to leave your home without having without having someone accompany you?  0 = No  E10. Because of  your problem have you been embarrassed in front of others?  0 = No  P11. Do quick movements of your head increase your problem?  4 = Yes  F12. Because of your problem, do you avoid heights?  4 = Yes  P13. Does turning over in bed increase your problem?  0 = No  F14. Because of your problem, is it difficult for you to do strenuous homework or yard work? 2 = Sometimes  E15. Because of your problem, are you afraid people may think you are intoxicated? 0 = No  F16. Because of your problem, is it difficult for you to go for a walk by yourself?  0 = No  P17. Does walking down a sidewalk increase your problem?  0 = No  E18.Because of your problem, is it difficult for you to concentrate 2 = Sometimes  F19. Because of your problem,  is it difficult for you to walk around your house in the dark? 0 = No  E20. Because of your problem, are you afraid to stay home alone?  0 = No  E21. Because of your problem, do you feel handicapped? 0 = No  E22. Has the problem placed stress on your relationships with members of your family or friends? 0 = No  E23. Because of your problem, are you depressed?  2 = Sometimes  F24. Does your problem interfere with your job or household responsibilities?  0 = No  P25. Does bending over increase your problem?  4 = Yes  TOTAL 30    DHI Scoring Instructions  The patient is asked to answer each question as it pertains to dizziness or unsteadiness problems, specifically  considering their condition during the last month. Questions are designed to incorporate functional (F), physical  (P), and emotional (E) impacts on disability.   Scores greater than 10 points should be referred to balance specialists for further evaluation.   16-34 Points (mild handicap)  36-52 Points (moderate handicap)  54+ Points (severe handicap)  Minimally Detectable Change: 17 points ([address] [place], 1990)  [place], G. SHAUNNA. and [place], C. W. (1990). The development of the Dizziness Handicap  Inventory. Archives of Otolaryngology - Head and Neck Surgery 116(4): [id number].   VESTIBULAR ASSESSMENT:  GENERAL OBSERVATION: No acute distress    SYMPTOM BEHAVIOR:  Subjective history: rare blurred vision with watching TV, 5 seconds dizziness with supine>sit  Non-Vestibular symptoms: NA  Type of dizziness: Blurred Vision, Imbalance (Disequilibrium), and Unsteady with head/body turns  Frequency: all the time  Duration: all the time  Aggravating factors: Induced by position change: supine to sit and sit to stand and Induced by motion: occur when walking, looking up at the ceiling, bending down to the ground, turning body quickly, and turning head quickly  Relieving factors: slow movements  Progression of symptoms: comes and goes in 4-6 week acute bouts  OCULOMOTOR EXAM:  Wears progressive lenses  Ocular Alignment: R eye slightly lower  Ocular ROM: No Limitations and rates 5/10 dizziness  Spontaneous Nystagmus: absent  Gaze-Induced Nystagmus: absent  Smooth Pursuits: intact  Saccades: intact and notes mild dizziness with saccades to L  Convergence/Divergence: 2 cm   Cover-cross-cover test: NT   VESTIBULAR - OCULAR REFLEX:   Slow VOR: Normal and Comment: mild symptoms horizontal and vertical  VOR Cancellation: Comment: slowed and reports 3-4/10 symptoms  Head-Impulse Test: HIT Right: negative HIT Left: negative  Dynamic Visual Acuity: NT   POSITIONAL TESTING: Right Dix-Hallpike: no nystagmus Left Dix-Hallpike: No nystagmus noted, but pt reports dizziness x 15 seconds Right Roll Test: no nystagmus Left Roll Test: no nystagmus   M-CTSIB  Condition 1: Firm Surface, EO 30 Sec, Normal Sway  Condition 2: Firm Surface, EC 30 Sec, Mild Sway  Condition 3: Foam Surface, EO 30 Sec, Mild Sway  Condition 4: Foam Surface, EC 30 Sec, Moderate Sway    MOTION SENSITIVITY:  Motion Sensitivity Quotient Intensity: 0 = none, 1 = Lightheaded, 2 = Mild, 3 = Moderate, 4 = Severe, 5 =  Vomiting  Intensity  1. Sitting to supine   2. Supine to L side   3. Supine to R side   4. Supine to sitting   5. L Hallpike-Dix   6. Up from L    7. R Hallpike-Dix   8. Up from R    9. Sitting, head tipped to L knee 2  10. Head  up from L knee 3  11. Sitting, head tipped to R knee 1-2  12. Head up from R knee 3  13. Sitting head turns x5 2  14.Sitting head nods x5 3  15. In stance, 180 turn to L  1-2-unsteadiness  16. In stance, 180 turn to R 1                                                                                                                                TREATMENT DATE: 10/27/2023   Canalith Repositioning:  Epley Left: Number of Reps: 1, Response to Treatment: symptoms improved, and Comment: very mild dizziness upon sitting EOM  PATIENT EDUCATION: Education details: Eval results, POC; rationale for BPPV treatment today, post CRM expectations  Person educated: Patient Education method: Explanation Education comprehension: verbalized understanding  HOME EXERCISE PROGRAM:  GOALS: Goals reviewed with patient? Yes  SHORT TERM GOALS: Target date: 11/12/2023  Pt will be independent with HEP for improved dizziness. Baseline: Goal status: INITIAL  2.  Pt will report 0/10 dizziness with bed mobility, position changes. Baseline:  Goal status: INITIAL    LONG TERM GOALS: Target date: 12/10/2023  Pt will be independent with HEP for improved dizziness, balance. Baseline:  Goal status: INITIAL  2.  MCTSIB Condition 4 to improve to mild sway for 30 seconds, for improved balance. Baseline: mod sway Goal status: INITIAL  3.  FGA score to improve to >23/30 for decreased fall risk. Baseline: TBD Goal status: INITIAL  4.  DHI score to improve to less than or equal to 22, for improved dizziness impacting ADLs. Baseline: 30 Goal status: INITIAL   ASSESSMENT:  CLINICAL IMPRESSION: Patient is a 78 y.o. male who was seen today for physical therapy  evaluation and treatment for peripheral vertigo.  He has history approximately 2 years ago of unsteadiness, dizziness, imbalance, and was seen at this clinic, with some improvement in symptoms.  He reports the last 5-6 weeks have been more acute, as far as dizziness and imbalance; he does not note overt spinning sensations.  With oculomotor testing, he does report increase in symptoms with smooth pursuits; L eye is depressed compared to R.  He reports increased symptoms with slowed horizontal VOR and with VOR cancellation.  HIT is negative.  With positional testing, he does report dizziness with L Trenda Craze, but no overt nystagmus noted.  Treated with L Epley maneuver, as L DH was the only position that he had symptoms.  He tolerates maneuver well and has no complaints upon leaving eval today.  He would benefit from skilled PT to further address the above stated dizziness and imbalance symptoms for improved overall functional mobility and decreased risk of falls.   OBJECTIVE IMPAIRMENTS: Abnormal gait, decreased balance, dizziness, and postural dysfunction.   ACTIVITY LIMITATIONS: bending, squatting, bed mobility, reach over head, and locomotion level  PARTICIPATION LIMITATIONS: community activity and yard work  PERSONAL FACTORS: 3+ comorbidities: see above  are also affecting patient's functional outcome.   REHAB POTENTIAL: Good  CLINICAL DECISION MAKING: Stable/uncomplicated  EVALUATION COMPLEXITY: Low   PLAN:  PT FREQUENCY: 1-2x/week  PT DURATION: 6 weeks plus eval  PLANNED INTERVENTIONS: 97750- Physical Performance Testing, 97110-Therapeutic exercises, 97530- Therapeutic activity, 97112- Neuromuscular re-education, 97535- Self Care, [id number]- Manual therapy, [phone]- Gait training, [phone]- Canalith repositioning, Patient/Family education, Balance training, and Vestibular training  PLAN FOR NEXT SESSION: Reassess for L BPPV and treat as needed; consider habituation (Brandt-Daroff) versus  other motion sensitivity exercises; multi-sensory balance ex ([id number] is previous Med[employer] code and he has been doing some of these-not sure which would still be appropriate)   Wenda Vanschaick W., PT 10/27/2023, 1:33 PM  [facility] at [facility] [address], [address] [place], [place] [address] Phone # [phone] Fax # [phone]

## 2023-10-28 ENCOUNTER — Other Ambulatory Visit: Payer: Self-pay | Admitting: Cardiovascular Disease

## 2023-11-04 ENCOUNTER — Ambulatory Visit

## 2023-11-04 DIAGNOSIS — R42 Dizziness and giddiness: Secondary | ICD-10-CM

## 2023-11-04 DIAGNOSIS — R2681 Unsteadiness on feet: Secondary | ICD-10-CM

## 2023-11-04 DIAGNOSIS — H8112 Benign paroxysmal vertigo, left ear: Secondary | ICD-10-CM

## 2023-11-04 NOTE — Therapy (Signed)
 OUTPATIENT PHYSICAL THERAPY VESTIBULAR TREATMENT     Patient Name: Kevin [name] MRN: [id number] DOB:11-03-1945, 78 y.o., male Today's Date: 11/04/2023  END OF SESSION:  PT End of Session - 11/04/23 0848     Visit Number 2    Number of Visits 13    Date for Recertification  12/10/23    Authorization Type Medicare/AARP    Progress Note Due on Visit 10    PT Start Time 0847    PT Stop Time 0930    PT Time Calculation (min) 43 min    Activity Tolerance Patient tolerated treatment well    Behavior During Therapy [facility] for tasks assessed/performed          Past Medical History:  Diagnosis Date   Arthritis    osteoarthritis-hips/ shoulders- no issues at present   Complication of anesthesia    longer to wake up   Foley catheter in place 05/30/2013   PT HAD SURG 05/30/13 - CYSTO AND CLOT EVACUATION BLADDER / PROSTATE - UNABLE TO VOID AFTER DISCHARGE - CAME BACK TO ER SAME NIGHT AND HAD FOLEY INSERTED TO DRAINAGE BAG - URINE WAS BLOODY BUT PT REPORTS ON 06/07/13 THAT URINE IN BAG MOSTLY CLEAR NOW.   GERD (gastroesophageal reflux disease)    History of kidney stones    x1   Hypertension    Muscle tension dysphonia    Past Surgical History:  Procedure Laterality Date   BACK SURGERY     lumbar    CYSTOSCOPY W/ URETEROSCOPY W/ LITHOTRIPSY     CYSTOSCOPY/RETROGRADE/URETEROSCOPY/STONE EXTRACTION WITH BASKET N/A 05/30/2013   Procedure: CYSTOSCOPY , BLADDER BIOPSY AND CLOT EVACUATION , CAUTERIZATIO OF LATERAL RIGHT AND LEFT LOBE OF PROSTATE;  Surgeon: Arlena LILLETTE Gal, MD;  Location: [facility];  Service: Urology;  Laterality: N/A;   HERNIA REPAIR     inguinal   INSERTION OF MESH N/A 11/24/2017   Procedure: INSERTION OF MESH;  Surgeon: Belinda Cough, MD;  Location: [facility] OR;  Service: General;  Laterality: N/A;   JOINT REPLACEMENT     '00-left/'08 RTHA   KNEE ARTHROSCOPY     left knee meniscus surgery   PROSTATE SURGERY Bilateral    7- 8 yrs ago   SHOULDER ARTHROSCOPY Right     SHOULDER ARTHROTOMY Left    TRANSURETHRAL RESECTION OF PROSTATE N/A 06/09/2013   Procedure: TRANSURETHRAL RESECTION OF THE PROSTATE (TURP) WITH GYRUS;  Surgeon: Arlena LILLETTE Gal, MD;  Location: [facility];  Service: Urology;  Laterality: N/A;   UMBILICAL HERNIA REPAIR N/A 11/24/2017   Procedure: UMBILICAL HERNIA REPAIR WITH MESH;  Surgeon: Belinda Cough, MD;  Location: [facility] OR;  Service: General;  Laterality: N/A;   Patient Active Problem List   Diagnosis Date Noted   Elevated coronary artery calcium  score 10/28/2021   Hyperlipidemia 05/26/2021   Arthritis 12/18/2020   Palpitations 04/27/2017   Moderate aortic insufficiency 04/27/2017   Blurred vision, bilateral 05/31/2014   Essential hypertension 01/25/2014   Benign prostatic hyperplasia 06/09/2013    PCP: Regino Slater, MD  REFERRING PROVIDER: Regino Slater, MD   REFERRING DIAG: H81.399 (ICD-10-CM) - Other peripheral vertigo, unspecified ear   THERAPY DIAG:  Dizziness and giddiness  BPPV (benign paroxysmal positional vertigo), left  Unsteadiness on feet  ONSET DATE: 10/13/2023 (MD referral)  Rationale for Evaluation and Treatment: Rehabilitation  SUBJECTIVE:   SUBJECTIVE STATEMENT: Noting that symptoms seem to last several days in a row, varying levels of feeling drunk.  Symptoms are never perfect, seems to have degrees  of intensity. Denies photo/phonophobia, no HA, has some increased neck stiffness, ETOH of any amount exacerbates.  Reports after last session with positioning and maneuver felt better w/ recurrence later in PM.  Pt accompanied by: self  PERTINENT HISTORY: muscle tension dysphonia (seeing speech therapist), arthritis, HTN  PAIN:  Are you having pain? Hx of back pain for years-under control  PRECAUTIONS: Fall  RED FLAGS: None   WEIGHT BEARING RESTRICTIONS: No  FALLS: Has patient fallen in last 6 months? No close calls  LIVING ENVIRONMENT: Lives with: lives with their spouse Lives in:  House/apartment  PLOF: IndependentDoes stretching, yoga  PATIENT GOALS: To try to get the dizziness better  OBJECTIVE:   TODAY'S TREATMENT: 11/04/23 Activity Comments  Loaded left Dix-Hallpike No nystagmus, no dizziness Slight symptoms with arising from position  Static visual acuity line 7 Dynamic Visual Acuity: line 4, 1 error. 0% line 5   Functional Gait Assessment 27/30  Seated VOR x1 x 30 sec At 120 bpm, no overt symptoms but some discomfort to horizontal  Standing VOR x 1 x 30 sec At 120 bpm, dizzy/symptoms to horizontal and incr to sx w/ vertical       Note: Objective measures were completed at Evaluation unless otherwise noted.  DIAGNOSTIC FINDINGS: NA for this episode  COGNITION: Overall cognitive status: Within functional limits for tasks assessed   SENSATION: WFL  POSTURE:  rounded shoulders  Cervical ROM:    Active A/PROM (deg) eval  Flexion 50  Extension 35  Right lateral flexion   Left lateral flexion   Right rotation 60  Left rotation 55  (Blank rows = not tested)   BED MOBILITY:  Independent  TRANSFERS: Assistive device utilized: None  Sit to stand: Modified independence Stand to sit: Modified independence   GAIT: Gait pattern: step through pattern Distance walked: 50 ft x 2 Assistive device utilized: None Level of assistance: Modified independence Comments: Reports feeling like he is off-balance   PATIENT SURVEYS:  DHI: THE DIZZINESS HANDICAP INVENTORY (DHI)  P1. Does looking up increase your problem? 4 = Yes  E2. Because of your problem, do you feel frustrated? 4 = Yes  F3. Because of your problem, do you restrict your travel for business or recreation?  0 = No  P4. Does walking down the aisle of a supermarket increase your problems?  0 = No  F5. Because of your problem, do you have difficulty getting into or out of bed?  0 = No  F6. Does your problem significantly restrict your participation in social activities, such as going  out to dinner, going to the movies, dancing, or going to parties? 2 = Sometimes  F7. Because of your problem, do you have difficulty reading?  0 = No  P8. Does performing more ambitious activities such as sports, dancing, household chores (sweeping or putting dishes away) increase your problems?  2 = Sometimes  E9. Because of your problem, are you afraid to leave your home without having without having someone accompany you?  0 = No  E10. Because of your problem have you been embarrassed in front of others?  0 = No  P11. Do quick movements of your head increase your problem?  4 = Yes  F12. Because of your problem, do you avoid heights?  4 = Yes  P13. Does turning over in bed increase your problem?  0 = No  F14. Because of your problem, is it difficult for you to do strenuous homework or yard work? 2 =  Sometimes  E15. Because of your problem, are you afraid people may think you are intoxicated? 0 = No  F16. Because of your problem, is it difficult for you to go for a walk by yourself?  0 = No  P17. Does walking down a sidewalk increase your problem?  0 = No  E18.Because of your problem, is it difficult for you to concentrate 2 = Sometimes  F19. Because of your problem, is it difficult for you to walk around your house in the dark? 0 = No  E20. Because of your problem, are you afraid to stay home alone?  0 = No  E21. Because of your problem, do you feel handicapped? 0 = No  E22. Has the problem placed stress on your relationships with members of your family or friends? 0 = No  E23. Because of your problem, are you depressed?  2 = Sometimes  F24. Does your problem interfere with your job or household responsibilities?  0 = No  P25. Does bending over increase your problem?  4 = Yes  TOTAL 30    DHI Scoring Instructions  The patient is asked to answer each question as it pertains to dizziness or unsteadiness problems, specifically  considering their condition during the last month. Questions are  designed to incorporate functional (F), physical  (P), and emotional (E) impacts on disability.   Scores greater than 10 points should be referred to balance specialists for further evaluation.   16-34 Points (mild handicap)  36-52 Points (moderate handicap)  54+ Points (severe handicap)  Minimally Detectable Change: 17 points ([address] [place], 1990)  Dortches, G. SHAUNNA. and Miles, C. W. (1990). The development of the Dizziness Handicap Inventory. Archives of Otolaryngology - Head and Neck Surgery 116(4): [id number].   VESTIBULAR ASSESSMENT:  GENERAL OBSERVATION: No acute distress    SYMPTOM BEHAVIOR:  Subjective history: rare blurred vision with watching TV, 5 seconds dizziness with supine>sit  Non-Vestibular symptoms: NA  Type of dizziness: Blurred Vision, Imbalance (Disequilibrium), and Unsteady with head/body turns  Frequency: all the time  Duration: all the time  Aggravating factors: Induced by position change: supine to sit and sit to stand and Induced by motion: occur when walking, looking up at the ceiling, bending down to the ground, turning body quickly, and turning head quickly  Relieving factors: slow movements  Progression of symptoms: comes and goes in 4-6 week acute bouts  OCULOMOTOR EXAM:  Wears progressive lenses  Ocular Alignment: R eye slightly lower  Ocular ROM: No Limitations and rates 5/10 dizziness  Spontaneous Nystagmus: absent  Gaze-Induced Nystagmus: absent  Smooth Pursuits: intact  Saccades: intact and notes mild dizziness with saccades to L  Convergence/Divergence: 2 cm   Cover-cross-cover test: NT   VESTIBULAR - OCULAR REFLEX:   Slow VOR: Normal and Comment: mild symptoms horizontal and vertical  VOR Cancellation: Comment: slowed and reports 3-4/10 symptoms  Head-Impulse Test: HIT Right: negative HIT Left: negative  Dynamic Visual Acuity: NT   POSITIONAL TESTING: Right Dix-Hallpike: no nystagmus Left Dix-Hallpike: No nystagmus noted, but pt  reports dizziness x 15 seconds Right Roll Test: no nystagmus Left Roll Test: no nystagmus   M-CTSIB  Condition 1: Firm Surface, EO 30 Sec, Normal Sway  Condition 2: Firm Surface, EC 30 Sec, Mild Sway  Condition 3: Foam Surface, EO 30 Sec, Mild Sway  Condition 4: Foam Surface, EC 30 Sec, Moderate Sway    MOTION SENSITIVITY:  Motion Sensitivity Quotient Intensity: 0 = none, 1 = Lightheaded,  2 = Mild, 3 = Moderate, 4 = Severe, 5 = Vomiting  Intensity  1. Sitting to supine   2. Supine to L side   3. Supine to R side   4. Supine to sitting   5. L Hallpike-Dix   6. Up from L    7. R Hallpike-Dix   8. Up from R    9. Sitting, head tipped to L knee 2  10. Head up from L knee 3  11. Sitting, head tipped to R knee 1-2  12. Head up from R knee 3  13. Sitting head turns x5 2  14.Sitting head nods x5 3  15. In stance, 180 turn to L  1-2-unsteadiness  16. In stance, 180 turn to R 1                                                                                                                                TREATMENT DATE: 10/27/2023   Canalith Repositioning:  Epley Left: Number of Reps: 1, Response to Treatment: symptoms improved, and Comment: very mild dizziness upon sitting EOM  PATIENT EDUCATION: Education details: vestibular hypofunction handout Person educated: Patient Education method: Explanation Education comprehension: verbalized understanding  HOME EXERCISE PROGRAM:  GOALS: Goals reviewed with patient? Yes  SHORT TERM GOALS: Target date: 11/12/2023  Pt will be independent with HEP for improved dizziness. Baseline: Goal status: INITIAL  2.  Pt will report 0/10 dizziness with bed mobility, position changes. Baseline:  Goal status: INITIAL    LONG TERM GOALS: Target date: 12/10/2023  Pt will be independent with HEP for improved dizziness, balance. Baseline:  Goal status: INITIAL  2.  MCTSIB Condition 4 to improve to mild sway for 30 seconds, for  improved balance. Baseline: mod sway Goal status: INITIAL  3.  FGA score to improve to >23/30 for decreased fall risk. Baseline: TBD Goal status: INITIAL  4.  DHI score to improve to less than or equal to 22, for improved dizziness impacting ADLs. Baseline: 30 Goal status: INITIAL   ASSESSMENT:  CLINICAL IMPRESSION: Notes issue has been chronic and at times will dissipate but never absent and then it returns episodically for several months.  Experiencing motion sensitivity to movements such as supine to sit and sit to stand and reports orthostatic measurements have been unrevealing in the past.  Dynamic Visual Acuity reveals discrepancy of 2+ line difference from static and provocation to symptoms with VOR x 1 in standing > sitting.  Functional Gait Assessment reveals low risk for falls but greater difficulty with tasks of eyes closed, head movements, narrow BOS.  No issue to repeat testing of left Dix-Hallpike with some motion sensitivity experienced with arising from test position.  Pt would benefit from continued vestibular rehabilitation to progress POC details to address deficits and limitations   OBJECTIVE IMPAIRMENTS: Abnormal gait, decreased balance, dizziness, and postural dysfunction.   ACTIVITY LIMITATIONS: bending, squatting, bed mobility, reach over head, and  locomotion level  PARTICIPATION LIMITATIONS: community activity and yard work  PERSONAL FACTORS: 3+ comorbidities: see above are also affecting patient's functional outcome.   REHAB POTENTIAL: Good  CLINICAL DECISION MAKING: Stable/uncomplicated  EVALUATION COMPLEXITY: Low   PLAN:  PT FREQUENCY: 1-2x/week  PT DURATION: 6 weeks plus eval  PLANNED INTERVENTIONS: 97750- Physical Performance Testing, 97110-Therapeutic exercises, 97530- Therapeutic activity, [id number]- Neuromuscular re-education, 97535- Self Care, [id number]- Manual therapy, [id number]- Gait training, [phone]- Canalith repositioning, Patient/Family education,  Balance training, and Vestibular training  PLAN FOR NEXT SESSION:  consider habituation (Brandt-Daroff) versus other motion sensitivity exercises; multi-sensory balance ex ([id number] is previous Med[employer] code and he has been doing some of these-not sure which would still be appropriate). Symptomatic to VOR in standing, trial in walking   [employer], PT 11/04/2023, 8:48 AM  [facility] at [facility] [address] [place], [address] [place], [place] [address] Phone # [phone] Fax # [phone]

## 2023-11-08 ENCOUNTER — Encounter: Payer: Self-pay | Admitting: Cardiovascular Disease

## 2023-11-09 ENCOUNTER — Ambulatory Visit

## 2023-11-09 DIAGNOSIS — R2681 Unsteadiness on feet: Secondary | ICD-10-CM

## 2023-11-09 DIAGNOSIS — R42 Dizziness and giddiness: Secondary | ICD-10-CM | POA: Diagnosis not present

## 2023-11-09 DIAGNOSIS — H8112 Benign paroxysmal vertigo, left ear: Secondary | ICD-10-CM

## 2023-11-09 NOTE — Therapy (Signed)
 OUTPATIENT PHYSICAL THERAPY VESTIBULAR TREATMENT     Patient Name: Kevin [name] MRN: [id number] DOB:1945/05/29, 78 y.o., male Today's Date: 11/09/2023  END OF SESSION:  PT End of Session - 11/09/23 1534     Visit Number 3    Number of Visits 13    Date for Recertification  12/10/23    Authorization Type Medicare/AARP    Progress Note Due on Visit 10    PT Start Time 1530    PT Stop Time 1615    PT Time Calculation (min) 45 min    Activity Tolerance Patient tolerated treatment well    Behavior During Therapy [facility] for tasks assessed/performed          Past Medical History:  Diagnosis Date   Arthritis    osteoarthritis-hips/ shoulders- no issues at present   Complication of anesthesia    longer to wake up   Foley catheter in place 05/30/2013   PT HAD SURG 05/30/13 - CYSTO AND CLOT EVACUATION BLADDER / PROSTATE - UNABLE TO VOID AFTER DISCHARGE - CAME BACK TO ER SAME NIGHT AND HAD FOLEY INSERTED TO DRAINAGE BAG - URINE WAS BLOODY BUT PT REPORTS ON 06/07/13 THAT URINE IN BAG MOSTLY CLEAR NOW.   GERD (gastroesophageal reflux disease)    History of kidney stones    x1   Hypertension    Muscle tension dysphonia    Past Surgical History:  Procedure Laterality Date   BACK SURGERY     lumbar    CYSTOSCOPY W/ URETEROSCOPY W/ LITHOTRIPSY     CYSTOSCOPY/RETROGRADE/URETEROSCOPY/STONE EXTRACTION WITH BASKET N/A 05/30/2013   Procedure: CYSTOSCOPY , BLADDER BIOPSY AND CLOT EVACUATION , CAUTERIZATIO OF LATERAL RIGHT AND LEFT LOBE OF PROSTATE;  Surgeon: Arlena LILLETTE Gal, MD;  Location: [facility];  Service: Urology;  Laterality: N/A;   HERNIA REPAIR     inguinal   INSERTION OF MESH N/A 11/24/2017   Procedure: INSERTION OF MESH;  Surgeon: Belinda Cough, MD;  Location: [facility] OR;  Service: General;  Laterality: N/A;   JOINT REPLACEMENT     '00-left/'08 RTHA   KNEE ARTHROSCOPY     left knee meniscus surgery   PROSTATE SURGERY Bilateral    7- 8 yrs ago   SHOULDER ARTHROSCOPY Right     SHOULDER ARTHROTOMY Left    TRANSURETHRAL RESECTION OF PROSTATE N/A 06/09/2013   Procedure: TRANSURETHRAL RESECTION OF THE PROSTATE (TURP) WITH GYRUS;  Surgeon: Arlena LILLETTE Gal, MD;  Location: [facility];  Service: Urology;  Laterality: N/A;   UMBILICAL HERNIA REPAIR N/A 11/24/2017   Procedure: UMBILICAL HERNIA REPAIR WITH MESH;  Surgeon: Belinda Cough, MD;  Location: [facility] OR;  Service: General;  Laterality: N/A;   Patient Active Problem List   Diagnosis Date Noted   Elevated coronary artery calcium  score 10/28/2021   Hyperlipidemia 05/26/2021   Arthritis 12/18/2020   Palpitations 04/27/2017   Moderate aortic insufficiency 04/27/2017   Blurred vision, bilateral 05/31/2014   Essential hypertension 01/25/2014   Benign prostatic hyperplasia 06/09/2013    PCP: Regino Slater, MD  REFERRING PROVIDER: Regino Slater, MD   REFERRING DIAG: H81.399 (ICD-10-CM) - Other peripheral vertigo, unspecified ear   THERAPY DIAG:  Dizziness and giddiness  BPPV (benign paroxysmal positional vertigo), left  Unsteadiness on feet  ONSET DATE: 10/13/2023 (MD referral)  Rationale for Evaluation and Treatment: Rehabilitation  SUBJECTIVE:   SUBJECTIVE STATEMENT: Having a rough day, awoke this morning and knew and had a music session earlier and this sees to have exacerbated. Feeling an issue of  off-balanced.  When things are good its 2-3/10 and increases 3-5/10. Symptoms are pervasive and independent of position and activity Pt accompanied by: self  PERTINENT HISTORY: muscle tension dysphonia (seeing speech therapist), arthritis, HTN  PAIN:  Are you having pain? Hx of back pain for years-under control  PRECAUTIONS: Fall  RED FLAGS: None   WEIGHT BEARING RESTRICTIONS: No  FALLS: Has patient fallen in last 6 months? No close calls  LIVING ENVIRONMENT: Lives with: lives with their spouse Lives in: House/apartment  PLOF: IndependentDoes stretching, yoga  PATIENT GOALS: To try to get the  dizziness better  OBJECTIVE:   TODAY'S TREATMENT: 11/09/23 Activity Comments  Review of previous HEP   Walking VOR x 1 Reports no issues or increase to symptoms  VOR x 2 W/ assist for coordination but reports no change of symptoms, no corrective saccade observed  Discussion of timeline and symptoms and relevant further medical/diagnostic mgmt options that may be of benefit E.g. vestibular migraine and respective mgmt options and options such as VNG at local balance disorder clinic           TODAY'S TREATMENT: 11/04/23 Activity Comments  Loaded left Dix-Hallpike No nystagmus, no dizziness Slight symptoms with arising from position  Static visual acuity line 7 Dynamic Visual Acuity: line 4, 1 error. 0% line 5   Functional Gait Assessment 27/30  Seated VOR x1 x 30 sec At 120 bpm, no overt symptoms but some discomfort to horizontal  Standing VOR x 1 x 30 sec At 120 bpm, dizzy/symptoms to horizontal and incr to sx w/ vertical       Note: Objective measures were completed at Evaluation unless otherwise noted.  DIAGNOSTIC FINDINGS: NA for this episode  COGNITION: Overall cognitive status: Within functional limits for tasks assessed   SENSATION: WFL  POSTURE:  rounded shoulders  Cervical ROM:    Active A/PROM (deg) eval  Flexion 50  Extension 35  Right lateral flexion   Left lateral flexion   Right rotation 60  Left rotation 55  (Blank rows = not tested)   BED MOBILITY:  Independent  TRANSFERS: Assistive device utilized: None  Sit to stand: Modified independence Stand to sit: Modified independence   GAIT: Gait pattern: step through pattern Distance walked: 50 ft x 2 Assistive device utilized: None Level of assistance: Modified independence Comments: Reports feeling like he is off-balance   PATIENT SURVEYS:  DHI: THE DIZZINESS HANDICAP INVENTORY (DHI)  P1. Does looking up increase your problem? 4 = Yes  E2. Because of your problem, do you feel  frustrated? 4 = Yes  F3. Because of your problem, do you restrict your travel for business or recreation?  0 = No  P4. Does walking down the aisle of a supermarket increase your problems?  0 = No  F5. Because of your problem, do you have difficulty getting into or out of bed?  0 = No  F6. Does your problem significantly restrict your participation in social activities, such as going out to dinner, going to the movies, dancing, or going to parties? 2 = Sometimes  F7. Because of your problem, do you have difficulty reading?  0 = No  P8. Does performing more ambitious activities such as sports, dancing, household chores (sweeping or putting dishes away) increase your problems?  2 = Sometimes  E9. Because of your problem, are you afraid to leave your home without having without having someone accompany you?  0 = No  E10. Because of your problem have you  been embarrassed in front of others?  0 = No  P11. Do quick movements of your head increase your problem?  4 = Yes  F12. Because of your problem, do you avoid heights?  4 = Yes  P13. Does turning over in bed increase your problem?  0 = No  F14. Because of your problem, is it difficult for you to do strenuous homework or yard work? 2 = Sometimes  E15. Because of your problem, are you afraid people may think you are intoxicated? 0 = No  F16. Because of your problem, is it difficult for you to go for a walk by yourself?  0 = No  P17. Does walking down a sidewalk increase your problem?  0 = No  E18.Because of your problem, is it difficult for you to concentrate 2 = Sometimes  F19. Because of your problem, is it difficult for you to walk around your house in the dark? 0 = No  E20. Because of your problem, are you afraid to stay home alone?  0 = No  E21. Because of your problem, do you feel handicapped? 0 = No  E22. Has the problem placed stress on your relationships with members of your family or friends? 0 = No  E23. Because of your problem, are you  depressed?  2 = Sometimes  F24. Does your problem interfere with your job or household responsibilities?  0 = No  P25. Does bending over increase your problem?  4 = Yes  TOTAL 30    DHI Scoring Instructions  The patient is asked to answer each question as it pertains to dizziness or unsteadiness problems, specifically  considering their condition during the last month. Questions are designed to incorporate functional (F), physical  (P), and emotional (E) impacts on disability.   Scores greater than 10 points should be referred to balance specialists for further evaluation.   16-34 Points (mild handicap)  36-52 Points (moderate handicap)  54+ Points (severe handicap)  Minimally Detectable Change: 17 points ([address] [place], 1990)  [place], G. SHAUNNA. and [place], C. W. (1990). The development of the Dizziness Handicap Inventory. Archives of Otolaryngology - Head and Neck Surgery 116(4): [id number].   VESTIBULAR ASSESSMENT:  GENERAL OBSERVATION: No acute distress    SYMPTOM BEHAVIOR:  Subjective history: rare blurred vision with watching TV, 5 seconds dizziness with supine>sit  Non-Vestibular symptoms: NA  Type of dizziness: Blurred Vision, Imbalance (Disequilibrium), and Unsteady with head/body turns  Frequency: all the time  Duration: all the time  Aggravating factors: Induced by position change: supine to sit and sit to stand and Induced by motion: occur when walking, looking up at the ceiling, bending down to the ground, turning body quickly, and turning head quickly  Relieving factors: slow movements  Progression of symptoms: comes and goes in 4-6 week acute bouts  OCULOMOTOR EXAM:  Wears progressive lenses  Ocular Alignment: R eye slightly lower  Ocular ROM: No Limitations and rates 5/10 dizziness  Spontaneous Nystagmus: absent  Gaze-Induced Nystagmus: absent  Smooth Pursuits: intact  Saccades: intact and notes mild dizziness with saccades to L  Convergence/Divergence: 2 cm    Cover-cross-cover test: NT   VESTIBULAR - OCULAR REFLEX:   Slow VOR: Normal and Comment: mild symptoms horizontal and vertical  VOR Cancellation: Comment: slowed and reports 3-4/10 symptoms  Head-Impulse Test: HIT Right: negative HIT Left: negative  Dynamic Visual Acuity: NT   POSITIONAL TESTING: Right Dix-Hallpike: no nystagmus Left Dix-Hallpike: No nystagmus noted, but pt reports  dizziness x 15 seconds Right Roll Test: no nystagmus Left Roll Test: no nystagmus   M-CTSIB  Condition 1: Firm Surface, EO 30 Sec, Normal Sway  Condition 2: Firm Surface, EC 30 Sec, Mild Sway  Condition 3: Foam Surface, EO 30 Sec, Mild Sway  Condition 4: Foam Surface, EC 30 Sec, Moderate Sway    MOTION SENSITIVITY:  Motion Sensitivity Quotient Intensity: 0 = none, 1 = Lightheaded, 2 = Mild, 3 = Moderate, 4 = Severe, 5 = Vomiting  Intensity  1. Sitting to supine   2. Supine to L side   3. Supine to R side   4. Supine to sitting   5. L Hallpike-Dix   6. Up from L    7. R Hallpike-Dix   8. Up from R    9. Sitting, head tipped to L knee 2  10. Head up from L knee 3  11. Sitting, head tipped to R knee 1-2  12. Head up from R knee 3  13. Sitting head turns x5 2  14.Sitting head nods x5 3  15. In stance, 180 turn to L  1-2-unsteadiness  16. In stance, 180 turn to R 1                                                                                                                                TREATMENT DATE: 10/27/2023   Canalith Repositioning:  Epley Left: Number of Reps: 1, Response to Treatment: symptoms improved, and Comment: very mild dizziness upon sitting EOM  PATIENT EDUCATION: Education details: vestibular hypofunction handout Person educated: Patient Education method: Explanation Education comprehension: verbalized understanding  HOME EXERCISE PROGRAM:  GOALS: Goals reviewed with patient? Yes  SHORT TERM GOALS: Target date: 11/12/2023  Pt will be independent with HEP  for improved dizziness. Baseline: Goal status: INITIAL  2.  Pt will report 0/10 dizziness with bed mobility, position changes. Baseline:  Goal status: INITIAL    LONG TERM GOALS: Target date: 12/10/2023  Pt will be independent with HEP for improved dizziness, balance. Baseline:  Goal status: INITIAL  2.  MCTSIB Condition 4 to improve to mild sway for 30 seconds, for improved balance. Baseline: mod sway Goal status: INITIAL  3.  FGA score to improve to >23/30 for decreased fall risk. Baseline: TBD Goal status: INITIAL  4.  DHI score to improve to less than or equal to 22, for improved dizziness impacting ADLs. Baseline: 30 Goal status: INITIAL   ASSESSMENT:  CLINICAL IMPRESSION: Review to HEP activities from past sessions and able to perform without provocation.  Discussed potential for further medical mgmt discussion and/or diagnostic techniques he could pursue such as VNG and provided with relevant reference materials.  Discussed clinical implications of what has been observed under this POC such as DVA and multisensory balance deficits.   OBJECTIVE IMPAIRMENTS: Abnormal gait, decreased balance, dizziness, and postural dysfunction.   ACTIVITY LIMITATIONS: bending, squatting, bed mobility, reach over head,  and locomotion level  PARTICIPATION LIMITATIONS: community activity and yard work  PERSONAL FACTORS: 3+ comorbidities: see above are also affecting patient's functional outcome.   REHAB POTENTIAL: Good  CLINICAL DECISION MAKING: Stable/uncomplicated  EVALUATION COMPLEXITY: Low   PLAN:  PT FREQUENCY: 1-2x/week  PT DURATION: 6 weeks plus eval  PLANNED INTERVENTIONS: 97750- Physical Performance Testing, 97110-Therapeutic exercises, 97530- Therapeutic activity, [id number]- Neuromuscular re-education, 97535- Self Care, [id number]- Manual therapy, [id number]- Gait training, [phone]- Canalith repositioning, Patient/Family education, Balance training, and Vestibular training  PLAN  FOR NEXT SESSION:  consider habituation (Brandt-Daroff) versus other motion sensitivity exercises; multi-sensory balance ex ([id number] is previous Med[employer] code and he has been doing some of these-not sure which would still be appropriate). Symptomatic to VOR in standing, trial in walking   [employer], PT 11/09/2023, 3:34 PM  [facility] at [facility] [address] [place], [address] [place], [place] [address] Phone # [phone] Fax # [phone]

## 2023-11-11 ENCOUNTER — Ambulatory Visit: Admitting: Physical Therapy

## 2023-11-12 MED ORDER — HYDRALAZINE HCL 25 MG PO TABS: 25.0000 mg | ORAL_TABLET | Freq: Three times a day (TID) | ORAL | 3 refills | Status: AC

## 2023-11-16 ENCOUNTER — Ambulatory Visit

## 2023-11-16 DIAGNOSIS — H8112 Benign paroxysmal vertigo, left ear: Secondary | ICD-10-CM

## 2023-11-16 DIAGNOSIS — R42 Dizziness and giddiness: Secondary | ICD-10-CM

## 2023-11-16 DIAGNOSIS — R2681 Unsteadiness on feet: Secondary | ICD-10-CM

## 2023-11-16 NOTE — Therapy (Signed)
 OUTPATIENT PHYSICAL THERAPY VESTIBULAR TREATMENT     Patient Name: Kevin [name] MRN: [id number] DOB:April 23, 1945, 78 y.o., male Today's Date: 11/16/2023  END OF SESSION:  PT End of Session - 11/16/23 1518     Visit Number 4    Number of Visits 13    Date for Recertification  12/10/23    Authorization Type Medicare/AARP    Progress Note Due on Visit 10    PT Start Time 1530    PT Stop Time 1615    PT Time Calculation (min) 45 min    Activity Tolerance Patient tolerated treatment well    Behavior During Therapy [facility] for tasks assessed/performed          Past Medical History:  Diagnosis Date   Arthritis    osteoarthritis-hips/ shoulders- no issues at present   Complication of anesthesia    longer to wake up   Foley catheter in place 05/30/2013   PT HAD SURG 05/30/13 - CYSTO AND CLOT EVACUATION BLADDER / PROSTATE - UNABLE TO VOID AFTER DISCHARGE - CAME BACK TO ER SAME NIGHT AND HAD FOLEY INSERTED TO DRAINAGE BAG - URINE WAS BLOODY BUT PT REPORTS ON 06/07/13 THAT URINE IN BAG MOSTLY CLEAR NOW.   GERD (gastroesophageal reflux disease)    History of kidney stones    x1   Hypertension    Muscle tension dysphonia    Past Surgical History:  Procedure Laterality Date   BACK SURGERY     lumbar    CYSTOSCOPY W/ URETEROSCOPY W/ LITHOTRIPSY     CYSTOSCOPY/RETROGRADE/URETEROSCOPY/STONE EXTRACTION WITH BASKET N/A 05/30/2013   Procedure: CYSTOSCOPY , BLADDER BIOPSY AND CLOT EVACUATION , CAUTERIZATIO OF LATERAL RIGHT AND LEFT LOBE OF PROSTATE;  Surgeon: Arlena LILLETTE Gal, MD;  Location: [facility];  Service: Urology;  Laterality: N/A;   HERNIA REPAIR     inguinal   INSERTION OF MESH N/A 11/24/2017   Procedure: INSERTION OF MESH;  Surgeon: Belinda Cough, MD;  Location: [facility];  Service: General;  Laterality: N/A;   JOINT REPLACEMENT     '00-left/'08 RTHA   KNEE ARTHROSCOPY     left knee meniscus surgery   PROSTATE SURGERY Bilateral    7- 8 yrs ago   SHOULDER ARTHROSCOPY Right     SHOULDER ARTHROTOMY Left    TRANSURETHRAL RESECTION OF PROSTATE N/A 06/09/2013   Procedure: TRANSURETHRAL RESECTION OF THE PROSTATE (TURP) WITH GYRUS;  Surgeon: Arlena LILLETTE Gal, MD;  Location: [facility];  Service: Urology;  Laterality: N/A;   UMBILICAL HERNIA REPAIR N/A 11/24/2017   Procedure: UMBILICAL HERNIA REPAIR WITH MESH;  Surgeon: Belinda Cough, MD;  Location: [facility] OR;  Service: General;  Laterality: N/A;   Patient Active Problem List   Diagnosis Date Noted   Elevated coronary artery calcium  score 10/28/2021   Hyperlipidemia 05/26/2021   Arthritis 12/18/2020   Palpitations 04/27/2017   Moderate aortic insufficiency 04/27/2017   Blurred vision, bilateral 05/31/2014   Essential hypertension 01/25/2014   Benign prostatic hyperplasia 06/09/2013    PCP: Regino Slater, MD  REFERRING PROVIDER: Regino Slater, MD   REFERRING DIAG: H81.399 (ICD-10-CM) - Other peripheral vertigo, unspecified ear   THERAPY DIAG:  Dizziness and giddiness  BPPV (benign paroxysmal positional vertigo), left  Unsteadiness on feet  ONSET DATE: 10/13/2023 (MD referral)  Rationale for Evaluation and Treatment: Rehabilitation  SUBJECTIVE:   SUBJECTIVE STATEMENT: Feeling a bit better this week.  Pt accompanied by: self  PERTINENT HISTORY: muscle tension dysphonia (seeing speech therapist), arthritis, HTN  PAIN:  Are you having pain? Hx of back pain for years-under control  PRECAUTIONS: Fall  RED FLAGS: None   WEIGHT BEARING RESTRICTIONS: No  FALLS: Has patient fallen in last 6 months? No close calls  LIVING ENVIRONMENT: Lives with: lives with their spouse Lives in: House/apartment  PLOF: IndependentDoes stretching, yoga  PATIENT GOALS: To try to get the dizziness better  OBJECTIVE:   TODAY'S TREATMENT: 11/16/23 Activity Comments  Forward bending/balance Placing rings to cones/forwrad/backward step  Diagonal VOR cancellation Provoking during, resolves after 5 sec  Walking w/  vertical ball toss    Quick turns  Minimal symtpoms  Fast turns w/ ball toss Increased symptoms  M-CTSIB Condition 3: normal Condition 4: mild-moderate 6/10 symptoms     TODAY'S TREATMENT: 11/09/23 Activity Comments  Review of previous HEP   Walking VOR x 1 Reports no issues or increase to symptoms  VOR x 2 W/ assist for coordination but reports no change of symptoms, no corrective saccade observed  Discussion of timeline and symptoms and relevant further medical/diagnostic mgmt options that may be of benefit E.g. vestibular migraine and respective mgmt options and options such as VNG at local balance disorder clinic           TODAY'S TREATMENT: 11/04/23 Activity Comments  Loaded left Dix-Hallpike No nystagmus, no dizziness Slight symptoms with arising from position  Static visual acuity line 7 Dynamic Visual Acuity: line 4, 1 error. 0% line 5   Functional Gait Assessment 27/30  Seated VOR x1 x 30 sec At 120 bpm, no overt symptoms but some discomfort to horizontal  Standing VOR x 1 x 30 sec At 120 bpm, dizzy/symptoms to horizontal and incr to sx w/ vertical       Note: Objective measures were completed at Evaluation unless otherwise noted.  DIAGNOSTIC FINDINGS: NA for this episode  COGNITION: Overall cognitive status: Within functional limits for tasks assessed   SENSATION: WFL  POSTURE:  rounded shoulders  Cervical ROM:    Active A/PROM (deg) eval  Flexion 50  Extension 35  Right lateral flexion   Left lateral flexion   Right rotation 60  Left rotation 55  (Blank rows = not tested)   BED MOBILITY:  Independent  TRANSFERS: Assistive device utilized: None  Sit to stand: Modified independence Stand to sit: Modified independence   GAIT: Gait pattern: step through pattern Distance walked: 50 ft x 2 Assistive device utilized: None Level of assistance: Modified independence Comments: Reports feeling like he is off-balance   PATIENT SURVEYS:  DHI: THE  DIZZINESS HANDICAP INVENTORY (DHI)  P1. Does looking up increase your problem? 4 = Yes  E2. Because of your problem, do you feel frustrated? 4 = Yes  F3. Because of your problem, do you restrict your travel for business or recreation?  0 = No  P4. Does walking down the aisle of a supermarket increase your problems?  0 = No  F5. Because of your problem, do you have difficulty getting into or out of bed?  0 = No  F6. Does your problem significantly restrict your participation in social activities, such as going out to dinner, going to the movies, dancing, or going to parties? 2 = Sometimes  F7. Because of your problem, do you have difficulty reading?  0 = No  P8. Does performing more ambitious activities such as sports, dancing, household chores (sweeping or putting dishes away) increase your problems?  2 = Sometimes  E9. Because of your problem, are you afraid to leave  your home without having without having someone accompany you?  0 = No  E10. Because of your problem have you been embarrassed in front of others?  0 = No  P11. Do quick movements of your head increase your problem?  4 = Yes  F12. Because of your problem, do you avoid heights?  4 = Yes  P13. Does turning over in bed increase your problem?  0 = No  F14. Because of your problem, is it difficult for you to do strenuous homework or yard work? 2 = Sometimes  E15. Because of your problem, are you afraid people may think you are intoxicated? 0 = No  F16. Because of your problem, is it difficult for you to go for a walk by yourself?  0 = No  P17. Does walking down a sidewalk increase your problem?  0 = No  E18.Because of your problem, is it difficult for you to concentrate 2 = Sometimes  F19. Because of your problem, is it difficult for you to walk around your house in the dark? 0 = No  E20. Because of your problem, are you afraid to stay home alone?  0 = No  E21. Because of your problem, do you feel handicapped? 0 = No  E22. Has the  problem placed stress on your relationships with members of your family or friends? 0 = No  E23. Because of your problem, are you depressed?  2 = Sometimes  F24. Does your problem interfere with your job or household responsibilities?  0 = No  P25. Does bending over increase your problem?  4 = Yes  TOTAL 30    DHI Scoring Instructions  The patient is asked to answer each question as it pertains to dizziness or unsteadiness problems, specifically  considering their condition during the last month. Questions are designed to incorporate functional (F), physical  (P), and emotional (E) impacts on disability.   Scores greater than 10 points should be referred to balance specialists for further evaluation.   16-34 Points (mild handicap)  36-52 Points (moderate handicap)  54+ Points (severe handicap)  Minimally Detectable Change: 17 points ([address] [place], 1990)  Haugen, G. SHAUNNA. and [place], C. W. (1990). The development of the Dizziness Handicap Inventory. Archives of Otolaryngology - Head and Neck Surgery 116(4): [id number].   VESTIBULAR ASSESSMENT:  GENERAL OBSERVATION: No acute distress    SYMPTOM BEHAVIOR:  Subjective history: rare blurred vision with watching TV, 5 seconds dizziness with supine>sit  Non-Vestibular symptoms: NA  Type of dizziness: Blurred Vision, Imbalance (Disequilibrium), and Unsteady with head/body turns  Frequency: all the time  Duration: all the time  Aggravating factors: Induced by position change: supine to sit and sit to stand and Induced by motion: occur when walking, looking up at the ceiling, bending down to the ground, turning body quickly, and turning head quickly  Relieving factors: slow movements  Progression of symptoms: comes and goes in 4-6 week acute bouts  OCULOMOTOR EXAM:  Wears progressive lenses  Ocular Alignment: R eye slightly lower  Ocular ROM: No Limitations and rates 5/10 dizziness  Spontaneous Nystagmus: absent  Gaze-Induced  Nystagmus: absent  Smooth Pursuits: intact  Saccades: intact and notes mild dizziness with saccades to L  Convergence/Divergence: 2 cm   Cover-cross-cover test: NT   VESTIBULAR - OCULAR REFLEX:   Slow VOR: Normal and Comment: mild symptoms horizontal and vertical  VOR Cancellation: Comment: slowed and reports 3-4/10 symptoms  Head-Impulse Test: HIT Right: negative HIT Left: negative  Dynamic Visual Acuity: NT   POSITIONAL TESTING: Right Dix-Hallpike: no nystagmus Left Dix-Hallpike: No nystagmus noted, but pt reports dizziness x 15 seconds Right Roll Test: no nystagmus Left Roll Test: no nystagmus   M-CTSIB  Condition 1: Firm Surface, EO 30 Sec, Normal Sway  Condition 2: Firm Surface, EC 30 Sec, Mild Sway  Condition 3: Foam Surface, EO 30 Sec, Mild Sway  Condition 4: Foam Surface, EC 30 Sec, Moderate Sway    MOTION SENSITIVITY:  Motion Sensitivity Quotient Intensity: 0 = none, 1 = Lightheaded, 2 = Mild, 3 = Moderate, 4 = Severe, 5 = Vomiting  Intensity  1. Sitting to supine   2. Supine to L side   3. Supine to R side   4. Supine to sitting   5. L Hallpike-Dix   6. Up from L    7. R Hallpike-Dix   8. Up from R    9. Sitting, head tipped to L knee 2  10. Head up from L knee 3  11. Sitting, head tipped to R knee 1-2  12. Head up from R knee 3  13. Sitting head turns x5 2  14.Sitting head nods x5 3  15. In stance, 180 turn to L  1-2-unsteadiness  16. In stance, 180 turn to R 1                                                                                                                                TREATMENT DATE: 10/27/2023   Canalith Repositioning:  Epley Left: Number of Reps: 1, Response to Treatment: symptoms improved, and Comment: very mild dizziness upon sitting EOM  PATIENT EDUCATION: Education details: vestibular hypofunction handout Person educated: Patient Education method: Explanation Education comprehension: verbalized understanding  HOME  EXERCISE PROGRAM:  GOALS: Goals reviewed with patient? Yes  SHORT TERM GOALS: Target date: 11/12/2023  Pt will be independent with HEP for improved dizziness. Baseline: Goal status: INITIAL  2.  Pt will report 0/10 dizziness with bed mobility, position changes. Baseline:  Goal status: INITIAL    LONG TERM GOALS: Target date: 12/10/2023  Pt will be independent with HEP for improved dizziness, balance. Baseline:  Goal status: INITIAL  2.  MCTSIB Condition 4 to improve to mild sway for 30 seconds, for improved balance. Baseline: mod sway Goal status: INITIAL  3.  FGA score to improve to >23/30 for decreased fall risk. Baseline: TBD Goal status: INITIAL  4.  DHI score to improve to less than or equal to 22, for improved dizziness impacting ADLs. Baseline: 30 Goal status: INITIAL   ASSESSMENT:  CLINICAL IMPRESSION: Pt notes feeling improved in symptoms in past 1-2 weeks. Symptomatic to faster horizontal movements. Continues to exhibit postural unsteadiness to M-CTSIB. Discussed other facets of vestibular rehabilitation  OBJECTIVE IMPAIRMENTS: Abnormal gait, decreased balance, dizziness, and postural dysfunction.   ACTIVITY LIMITATIONS: bending, squatting, bed mobility, reach over head, and locomotion level  PARTICIPATION LIMITATIONS: community activity  and yard work  PERSONAL FACTORS: 3+ comorbidities: see above are also affecting patient's functional outcome.   REHAB POTENTIAL: Good  CLINICAL DECISION MAKING: Stable/uncomplicated  EVALUATION COMPLEXITY: Low   PLAN:  PT FREQUENCY: 1-2x/week  PT DURATION: 6 weeks plus eval  PLANNED INTERVENTIONS: 97750- Physical Performance Testing, 97110-Therapeutic exercises, 97530- Therapeutic activity, [id number]- Neuromuscular re-education, 97535- Self Care, [id number]- Manual therapy, [id number]- Gait training, [phone]- Canalith repositioning, Patient/Family education, Balance training, and Vestibular training  PLAN FOR NEXT SESSION:   consider habituation (Brandt-Daroff) versus other motion sensitivity exercises; multi-sensory balance ex ([id number] is previous MedBridge code and he has been doing some of these-not sure which would still be appropriate). Symptomatic to VOR in standing, trial in walking   [employer], PT 11/16/2023, 3:18 PM  [facility] at [facility] [address] [place], [address] [place], [place] [address] Phone # [phone] Fax # [phone]

## 2023-11-18 ENCOUNTER — Ambulatory Visit: Admitting: Physical Therapy

## 2023-11-22 ENCOUNTER — Ambulatory Visit: Attending: Cardiology | Admitting: Pharmacist Clinician (PhC)/ Clinical Pharmacy Specialist

## 2023-11-22 VITALS — BP 142/56

## 2023-11-22 DIAGNOSIS — I10 Essential (primary) hypertension: Secondary | ICD-10-CM | POA: Diagnosis present

## 2023-11-22 MED ORDER — DEXAMETHASONE 1 MG PO TABS
ORAL_TABLET | ORAL | 0 refills | Status: AC
Start: 2023-11-22 — End: ?

## 2023-11-22 NOTE — Progress Notes (Unsigned)
 11/23/2023 Kevin [name] [birth year] [id number]   HPI:  Kevin [name] is a 78 y.o. male patient of Dr Court, with a PMH below who presents today for hypertension clinic follow up.  We have followed him for some time now, as he dealt with anxieties about checking his BP.  At one visit finally had a long discussion about his almost obsession with blood pressure readings.  His wife was with him that morning.  Patient reports that he wakes up every morning wondering if this is the day his elevated BP readings will cause him to have a stroke.  He will then take his BP and his mood for the day is based on the reading he gets.  His wife agrees with this information, stating that if his pressure is elevated, he is anxious and stressed the remainder of the day.  He agrees that it is consuming much of his thought process, and endorses that he has recently had what he can only describe as a panic attack.  Last November he had asked to come off BP medications because of brain fog, and instead treat with natural products (beet juice).  This did not work, causing his BP to become more elevated, which then led to further anxieties and stress.  He tried sertraline, but caused him side effects, and now he is currently taking ashwaganda twice daily.  He feels that this has been quite helpful.  Once we got his pressure back under control, he did well for some time.   Several months ago he started noticing diastolic readings dropping into the 40's more and more.  Systolic has been stable and WNL for the most part.  His only symptoms were some sluggishness and fatigue by the end of the day.  He continues to have occasional episodes of dizziness.  At his last visit, we discontinued hydralazine .  I have seen him off and on over the past few year.    Today he returns for follow up.   Continues to avoid home BP checks, but notes when he is in medical appointments, he is seeing his pressure gradually increase.  He states  the vertigo/dizziness is more bothersome and recently had 2 weeks of continuing problems.  He is scheduled to see PT for vestibular treatment later this week.    Blood Pressure Goal:  < 140/80  (keep systolic goal higher to avoid diastolic readings < 50)  Current Medications:  valsartan  320 mg qhs, hydralazine  25 mg tid, amlodipine  5 mg qam, spironolactone  25 mg qd  Family Hx:  father had hypertension for many years, died after MI at age 43.  No cardiac history in mother.  One sister with hypertension.  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  has started limiting wine to 1 glass/day  Diet: mostly home cooked from scratch, no added salt; has gone to mostly plant based diet but does till have occasional meat; has cut alcohol to 5 glasses per week, last 6 months lost 15-18 lbs  Exercise: walking most days, hiking when able, yoga, piliates     Home BP readings:  home readings 130-140/60-70  Intolerances: nkda  Labs:  04/2021:  Na 142, K 4.1, Glu 103, BUN 23, SCr 1.10, GFR 70  12/02/20:  Na 140, K 3.4, Glu 118, BUN 20, SCr 0.94 GFR >60  Wt Readings from Last 3 Encounters:  05/26/23 196 lb 9.6 oz (89.2 kg)  11/25/22 192 lb 6.4  oz (87.3 kg)  11/13/22 191 lb 6.4 oz (86.8 kg)   BP Readings from Last 3 Encounters:  11/22/23 (!) 132/52  05/26/23 (!) 144/62  11/25/22 (!) 158/67   Pulse Readings from Last 3 Encounters:  05/26/23 60  11/25/22 69  11/13/22 61    Current Outpatient Medications  Medication Sig Dispense Refill   amLODipine  (NORVASC ) 5 MG tablet TAKE 1 TABLET(5 MG) BY MOUTH DAILY 90 tablet 2   amoxicillin (AMOXIL) 500 MG capsule Before dental procedures     azelastine (ASTELIN) 0.1 % nasal spray Place 1 spray into both nostrils as needed.     dexamethasone  (DECADRON ) 1 MG tablet Take 1 tablet between 11 pm and midnight 1 tablet 0   hydrALAZINE  (APRESOLINE ) 25 MG tablet Take 1 tablet (25 mg total) by mouth 3 (three) times daily. 270 tablet 3    ipratropium (ATROVENT) 0.06 % nasal spray Place 1 spray into the nose as needed.     ketoconazole (NIZORAL) 2 % shampoo Apply 1 application topically as needed.     latanoprost (XALATAN) 0.005 % ophthalmic solution Place 1 drop into both eyes at bedtime.     Misc Natural Products (TART CHERRY ADVANCED PO) Take 2 capsules by mouth daily.     omeprazole (PRILOSEC) 40 MG capsule Take 40 mg by mouth daily.     spironolactone  (ALDACTONE ) 25 MG tablet TAKE 1 TABLET(25 MG) BY MOUTH DAILY 90 tablet 3   SYSTANE ULTRA 0.4-0.3 % SOLN Apply 1 drop to eye 3 (three) times daily.     TURMERIC PO Take 2 capsules by mouth daily.     valsartan  (DIOVAN ) 320 MG tablet TAKE 1 TABLET(320 MG) BY MOUTH DAILY 90 tablet 3   White Petrolatum-Mineral Oil (SYSTANE NIGHTTIME) OINT      No current facility-administered medications for this visit.    Allergies  Allergen Reactions   Crestor  [Rosuvastatin ]     Myalgias   Zoloft [Sertraline]     Panic attacks    Past Medical History:  Diagnosis Date   Arthritis    osteoarthritis-hips/ shoulders- no issues at present   Complication of anesthesia    longer to wake up   Foley catheter in place 05/30/2013   PT HAD SURG 05/30/13 - CYSTO AND CLOT EVACUATION BLADDER / PROSTATE - UNABLE TO VOID AFTER DISCHARGE - CAME BACK TO ER SAME NIGHT AND HAD FOLEY INSERTED TO DRAINAGE BAG - URINE WAS BLOODY BUT PT REPORTS ON 06/07/13 THAT URINE IN BAG MOSTLY CLEAR NOW.   GERD (gastroesophageal reflux disease)    History of kidney stones    x1   Hypertension    Muscle tension dysphonia     Blood pressure (!) 132/52.    Essential hypertension Assessment: BP is controlled in office BP 132/52 mmHg; (second reading) Continues to have episodes of dizziness/vertigo Tolerates valsartan  320 mg qhs, hydralazine  25 mg tid, amlodipine  5 mg qam, spironolactone  25 mg qd well, without any side effects Denies SOB, palpitation, chest pain, headaches,or swelling Reiterated the importance of  regular exercise and low salt diet   Plan:  Continue taking valsartan  320 mg qhs, hydralazine  25 mg tid, amlodipine  5 mg qam, spironolactone  25 mg qd Will see if we can get dexamethasone  suppression test, (need to determine if we can order dexamethasone  level) and aldosterone labs   Allean Mink PharmD CPP [facility] [address] [place] [address] [place], [place] [address] [phone]

## 2023-11-23 ENCOUNTER — Encounter: Payer: Self-pay | Admitting: Pharmacist Clinician (PhC)/ Clinical Pharmacy Specialist

## 2023-11-23 ENCOUNTER — Ambulatory Visit: Attending: Family Medicine

## 2023-11-23 DIAGNOSIS — R2681 Unsteadiness on feet: Secondary | ICD-10-CM | POA: Insufficient documentation

## 2023-11-23 DIAGNOSIS — R42 Dizziness and giddiness: Secondary | ICD-10-CM | POA: Diagnosis present

## 2023-11-23 NOTE — Patient Instructions (Signed)
 I will check on getting the dexamethasone  suppression test and hyperaldosterone labs  Take your BP meds as follows:  Continue with your current medications  Your blood pressure goal is < 140/80  To check your pressure at home you will need to:  1. Sit up in a chair, with feet flat on the floor and back supported. Do not cross your ankles or legs. 2. Rest your left arm so that the cuff is about heart level. If the cuff goes on your upper arm,  then just relax the arm on the table, arm of the chair or your lap. If you have a wrist cuff, we  suggest relaxing your wrist against your chest (think of it as Pledging the Flag with the  wrong arm).  3. Place the cuff snugly around your arm, about 1 inch above the crook of your elbow. The  cords should be inside the groove of your elbow.  4. Sit quietly, with the cuff in place, for about 5 minutes. After that 5 minutes press the power  button to start a reading. 5. Do not talk or move while the reading is taking place.  6. Record your readings on a sheet of paper. Although most cuffs have a memory, it is often  easier to see a pattern developing when the numbers are all in front of you.  7. You can repeat the reading after 1-3 minutes if it is recommended  Make sure your bladder is empty and you have not had caffeine or tobacco within the last 30 min  Always bring your blood pressure log with you to your appointments. If you have not brought your monitor in to be double checked for accuracy, please bring it to your next appointment.  You can find a list of quality blood pressure cuffs at [email or web address]  Important lifestyle changes to control high blood pressure  Intervention  Effect on the BP  Lose extra pounds and watch your waistline Weight loss is one of the most effective lifestyle changes for controlling blood pressure. If you're overweight or obese, losing even a small amount of weight can help reduce blood pressure. Blood pressure might  go down by about 1 millimeter of mercury (mm Hg) with each kilogram (about 2.2 pounds) of weight lost.  Exercise regularly As a general goal, aim for at least 30 minutes of moderate physical activity every day. Regular physical activity can lower high blood pressure by about 5 to 8 mm Hg.  Eat a healthy diet Eating a diet rich in whole grains, fruits, vegetables, and low-fat dairy products and low in saturated fat and cholesterol. A healthy diet can lower high blood pressure by up to 11 mm Hg.  Reduce salt (sodium) in your diet Even a small reduction of sodium in the diet can improve heart health and reduce high blood pressure by about 5 to 6 mm Hg.  Limit alcohol One drink equals 12 ounces of beer, 5 ounces of wine, or 1.5 ounces of 80-proof liquor.  Limiting alcohol to less than one drink a day for women or two drinks a day for men can help lower blood pressure by about 4 mm Hg.   If you have any questions or concerns please use My Chart to send questions or call the office at [phone]

## 2023-11-23 NOTE — Therapy (Signed)
 OUTPATIENT PHYSICAL THERAPY VESTIBULAR TREATMENT and D/C Summary     Patient Name: Kevin [name] MRN: [id number] DOB:Oct 08, 1945, 78 y.o., male Today's Date: 11/23/2023  PHYSICAL THERAPY DISCHARGE SUMMARY  Visits from Start of Care: 5  Current functional level related to goals / functional outcomes: Pt reports improved symptoms in past 2 weeks rating 2-3/10 dizziness reduced from previous 6-7/10.  Improved self-rating on DHI from 30 to 20/100.  Low risk for falls per outcome measures   Remaining deficits: Has persistent baseline degree of dizziness of 2-3/10   Education / Equipment: HEP   Patient agrees to discharge. Patient goals were partially met. Patient is being discharged due to being pleased with the current functional level.   END OF SESSION:  PT End of Session - 11/23/23 1527     Visit Number 5    Number of Visits 13    Date for Recertification  12/10/23    Authorization Type Medicare/AARP    Progress Note Due on Visit 10    PT Start Time 1530    PT Stop Time 1615    PT Time Calculation (min) 45 min    Activity Tolerance Patient tolerated treatment well    Behavior During Therapy WFL for tasks assessed/performed          Past Medical History:  Diagnosis Date   Arthritis    osteoarthritis-hips/ shoulders- no issues at present   Complication of anesthesia    longer to wake up   Foley catheter in place 05/30/2013   PT HAD SURG 05/30/13 - CYSTO AND CLOT EVACUATION BLADDER / PROSTATE - UNABLE TO VOID AFTER DISCHARGE - CAME BACK TO ER SAME NIGHT AND HAD FOLEY INSERTED TO DRAINAGE BAG - URINE WAS BLOODY BUT PT REPORTS ON 06/07/13 THAT URINE IN BAG MOSTLY CLEAR NOW.   GERD (gastroesophageal reflux disease)    History of kidney stones    x1   Hypertension    Muscle tension dysphonia    Past Surgical History:  Procedure Laterality Date   BACK SURGERY     lumbar    CYSTOSCOPY W/ URETEROSCOPY W/ LITHOTRIPSY     CYSTOSCOPY/RETROGRADE/URETEROSCOPY/STONE  EXTRACTION WITH BASKET N/A 05/30/2013   Procedure: CYSTOSCOPY , BLADDER BIOPSY AND CLOT EVACUATION , CAUTERIZATIO OF LATERAL RIGHT AND LEFT LOBE OF PROSTATE;  Surgeon: Arlena LILLETTE Gal, MD;  Location: [facility];  Service: Urology;  Laterality: N/A;   HERNIA REPAIR     inguinal   INSERTION OF MESH N/A 11/24/2017   Procedure: INSERTION OF MESH;  Surgeon: Belinda Cough, MD;  Location: [facility];  Service: General;  Laterality: N/A;   JOINT REPLACEMENT     '00-left/'08 RTHA   KNEE ARTHROSCOPY     left knee meniscus surgery   PROSTATE SURGERY Bilateral    7- 8 yrs ago   SHOULDER ARTHROSCOPY Right    SHOULDER ARTHROTOMY Left    TRANSURETHRAL RESECTION OF PROSTATE N/A 06/09/2013   Procedure: TRANSURETHRAL RESECTION OF THE PROSTATE (TURP) WITH GYRUS;  Surgeon: Arlena LILLETTE Gal, MD;  Location: [facility];  Service: Urology;  Laterality: N/A;   UMBILICAL HERNIA REPAIR N/A 11/24/2017   Procedure: UMBILICAL HERNIA REPAIR WITH MESH;  Surgeon: Belinda Cough, MD;  Location: [facility] OR;  Service: General;  Laterality: N/A;   Patient Active Problem List   Diagnosis Date Noted   Elevated coronary artery calcium  score 10/28/2021   Hyperlipidemia 05/26/2021   Arthritis 12/18/2020   Palpitations 04/27/2017   Moderate aortic insufficiency 04/27/2017   Blurred vision, bilateral  05/31/2014   Essential hypertension 01/25/2014   Benign prostatic hyperplasia 06/09/2013    PCP: Regino Slater, MD  REFERRING PROVIDER: Regino Slater, MD   REFERRING DIAG: [phone] (ICD-10-CM) - Other peripheral vertigo, unspecified ear   THERAPY DIAG:  Dizziness and giddiness  Unsteadiness on feet  ONSET DATE: 10/13/2023 (MD referral)  Rationale for Evaluation and Treatment: Rehabilitation  SUBJECTIVE:   SUBJECTIVE STATEMENT: Past 2 weeks been about 2-3/10. Seems to be at the tail-end of whatever spell.  Down from 6-7/10 initially. Pt accompanied by: self  PERTINENT HISTORY: muscle tension dysphonia (seeing speech  therapist), arthritis, HTN  PAIN:  Are you having pain? Hx of back pain for years-under control  PRECAUTIONS: Fall  RED FLAGS: None   WEIGHT BEARING RESTRICTIONS: No  FALLS: Has patient fallen in last 6 months? No close calls  LIVING ENVIRONMENT: Lives with: lives with their spouse Lives in: House/apartment  PLOF: IndependentDoes stretching, yoga  PATIENT GOALS: To try to get the dizziness better  OBJECTIVE:   TODAY'S TREATMENT: 11/23/23 Activity Comments  POC details/review   DHI 20/100  HEP review -added diagonal head movements with saccidic targets              TODAY'S TREATMENT: 11/16/23 Activity Comments  Forward bending/balance Placing rings to cones/forwrad/backward step  Diagonal VOR cancellation Provoking during, resolves after 5 sec  Walking w/ vertical ball toss    Quick turns  Minimal symtpoms  Fast turns w/ ball toss Increased symptoms  M-CTSIB Condition 3: normal Condition 4: mild-moderate 6/10 symptoms           Note: Objective measures were completed at Evaluation unless otherwise noted.  DIAGNOSTIC FINDINGS: NA for this episode  COGNITION: Overall cognitive status: Within functional limits for tasks assessed   SENSATION: WFL  POSTURE:  rounded shoulders  Cervical ROM:    Active A/PROM (deg) eval  Flexion 50  Extension 35  Right lateral flexion   Left lateral flexion   Right rotation 60  Left rotation 55  (Blank rows = not tested)   BED MOBILITY:  Independent  TRANSFERS: Assistive device utilized: None  Sit to stand: Modified independence Stand to sit: Modified independence   GAIT: Gait pattern: step through pattern Distance walked: 50 ft x 2 Assistive device utilized: None Level of assistance: Modified independence Comments: Reports feeling like he is off-balance   PATIENT SURVEYS:  DHI: THE DIZZINESS HANDICAP INVENTORY (DHI)  P1. Does looking up increase your problem? 4 = Yes  E2. Because of your  problem, do you feel frustrated? 4 = Yes  F3. Because of your problem, do you restrict your travel for business or recreation?  0 = No  P4. Does walking down the aisle of a supermarket increase your problems?  0 = No  F5. Because of your problem, do you have difficulty getting into or out of bed?  0 = No  F6. Does your problem significantly restrict your participation in social activities, such as going out to dinner, going to the movies, dancing, or going to parties? 2 = Sometimes  F7. Because of your problem, do you have difficulty reading?  0 = No  P8. Does performing more ambitious activities such as sports, dancing, household chores (sweeping or putting dishes away) increase your problems?  2 = Sometimes  E9. Because of your problem, are you afraid to leave your home without having without having someone accompany you?  0 = No  E10. Because of your problem have you been embarrassed  in front of others?  0 = No  P11. Do quick movements of your head increase your problem?  4 = Yes  F12. Because of your problem, do you avoid heights?  4 = Yes  P13. Does turning over in bed increase your problem?  0 = No  F14. Because of your problem, is it difficult for you to do strenuous homework or yard work? 2 = Sometimes  E15. Because of your problem, are you afraid people may think you are intoxicated? 0 = No  F16. Because of your problem, is it difficult for you to go for a walk by yourself?  0 = No  P17. Does walking down a sidewalk increase your problem?  0 = No  E18.Because of your problem, is it difficult for you to concentrate 2 = Sometimes  F19. Because of your problem, is it difficult for you to walk around your house in the dark? 0 = No  E20. Because of your problem, are you afraid to stay home alone?  0 = No  E21. Because of your problem, do you feel handicapped? 0 = No  E22. Has the problem placed stress on your relationships with members of your family or friends? 0 = No  E23. Because of your  problem, are you depressed?  2 = Sometimes  F24. Does your problem interfere with your job or household responsibilities?  0 = No  P25. Does bending over increase your problem?  4 = Yes  TOTAL 30    DHI Scoring Instructions  The patient is asked to answer each question as it pertains to dizziness or unsteadiness problems, specifically  considering their condition during the last month. Questions are designed to incorporate functional (F), physical  (P), and emotional (E) impacts on disability.   Scores greater than 10 points should be referred to balance specialists for further evaluation.   16-34 Points (mild handicap)  36-52 Points (moderate handicap)  54+ Points (severe handicap)  Minimally Detectable Change: 17 points ([address] [place], 1990)  [place], G. SHAUNNA. and Reasnor, C. W. (1990). The development of the Dizziness Handicap Inventory. Archives of Otolaryngology - Head and Neck Surgery 116(4): [id number].   VESTIBULAR ASSESSMENT:  GENERAL OBSERVATION: No acute distress    SYMPTOM BEHAVIOR:  Subjective history: rare blurred vision with watching TV, 5 seconds dizziness with supine>sit  Non-Vestibular symptoms: NA  Type of dizziness: Blurred Vision, Imbalance (Disequilibrium), and Unsteady with head/body turns  Frequency: all the time  Duration: all the time  Aggravating factors: Induced by position change: supine to sit and sit to stand and Induced by motion: occur when walking, looking up at the ceiling, bending down to the ground, turning body quickly, and turning head quickly  Relieving factors: slow movements  Progression of symptoms: comes and goes in 4-6 week acute bouts  OCULOMOTOR EXAM:  Wears progressive lenses  Ocular Alignment: R eye slightly lower  Ocular ROM: No Limitations and rates 5/10 dizziness  Spontaneous Nystagmus: absent  Gaze-Induced Nystagmus: absent  Smooth Pursuits: intact  Saccades: intact and notes mild dizziness with saccades to  L  Convergence/Divergence: 2 cm   Cover-cross-cover test: NT   VESTIBULAR - OCULAR REFLEX:   Slow VOR: Normal and Comment: mild symptoms horizontal and vertical  VOR Cancellation: Comment: slowed and reports 3-4/10 symptoms  Head-Impulse Test: HIT Right: negative HIT Left: negative  Dynamic Visual Acuity: NT   POSITIONAL TESTING: Right Dix-Hallpike: no nystagmus Left Dix-Hallpike: No nystagmus noted, but pt reports dizziness x  15 seconds Right Roll Test: no nystagmus Left Roll Test: no nystagmus   M-CTSIB  Condition 1: Firm Surface, EO 30 Sec, Normal Sway  Condition 2: Firm Surface, EC 30 Sec, Mild Sway  Condition 3: Foam Surface, EO 30 Sec, Mild Sway  Condition 4: Foam Surface, EC 30 Sec, Moderate Sway    MOTION SENSITIVITY:  Motion Sensitivity Quotient Intensity: 0 = none, 1 = Lightheaded, 2 = Mild, 3 = Moderate, 4 = Severe, 5 = Vomiting  Intensity  1. Sitting to supine   2. Supine to L side   3. Supine to R side   4. Supine to sitting   5. L Hallpike-Dix   6. Up from L    7. R Hallpike-Dix   8. Up from R    9. Sitting, head tipped to L knee 2  10. Head up from L knee 3  11. Sitting, head tipped to R knee 1-2  12. Head up from R knee 3  13. Sitting head turns x5 2  14.Sitting head nods x5 3  15. In stance, 180 turn to L  1-2-unsteadiness  16. In stance, 180 turn to R 1                                                                                                                                TREATMENT DATE: 10/27/2023   Canalith Repositioning:  Epley Left: Number of Reps: 1, Response to Treatment: symptoms improved, and Comment: very mild dizziness upon sitting EOM  PATIENT EDUCATION: Education details: vestibular hypofunction handout Person educated: Patient Education method: Explanation Education comprehension: verbalized understanding  HOME EXERCISE PROGRAM:  GOALS: Goals reviewed with patient? Yes  SHORT TERM GOALS: Target date:  11/12/2023  Pt will be independent with HEP for improved dizziness. Baseline: Goal status: MET  2.  Pt will report 0/10 dizziness with bed mobility, position changes. Baseline: 0/10 rolling, supine<>sit 3/10 (5 sec duration) Goal status: NOT MET    LONG TERM GOALS: Target date: 12/10/2023  Pt will be independent with HEP for improved dizziness, balance. Baseline:  Goal status: MET  2.  MCTSIB Condition 4 to improve to mild sway for 30 seconds, for improved balance. Baseline: mod sway; mild x 30 Goal status: MET  3.  FGA score to improve to >23/30 for decreased fall risk. Baseline: 27/30 baseline score Goal status: MET  4.  DHI score to improve to less than or equal to 22, for improved dizziness impacting ADLs. Baseline: 30; 20 Goal status: MET   ASSESSMENT:  CLINICAL IMPRESSION: POC details performance/review meeting stated LTG.  Pt notes overall improvement in symptoms during course of plan of care and has resumed more vigorous activities without provocation but notes a degree of baseline dizziness that has persisted in some fashion over past 2-3 years.  Improved postural stability noted under condition 4 M-CTSIB and self-report of symptoms improved from 30 to 20/100 DHI.  Pt has comprehensive  HEP and good understanding of methods of progressing intensity/complexity.  D/C at this time and reports he will undergo further testing at local balance disorder clinic.    OBJECTIVE IMPAIRMENTS: Abnormal gait, decreased balance, dizziness, and postural dysfunction.   ACTIVITY LIMITATIONS: bending, squatting, bed mobility, reach over head, and locomotion level  PARTICIPATION LIMITATIONS: community activity and yard work  PERSONAL FACTORS: 3+ comorbidities: see above are also affecting patient's functional outcome.   REHAB POTENTIAL: Good  CLINICAL DECISION MAKING: Stable/uncomplicated  EVALUATION COMPLEXITY: Low   PLAN:  PT FREQUENCY: 1-2x/week  PT DURATION: 6 weeks plus  eval  PLANNED INTERVENTIONS: 97750- Physical Performance Testing, 97110-Therapeutic exercises, 97530- Therapeutic activity, [id number]- Neuromuscular re-education, 97535- Self Care, [id number]- Manual therapy, [id number]- Gait training, [phone]- Canalith repositioning, Patient/Family education, Balance training, and Vestibular training  PLAN FOR NEXT SESSION:  consider habituation (Brandt-Daroff) versus other motion sensitivity exercises; multi-sensory balance ex ([id number] is previous Med[employer] code and he has been doing some of these-not sure which would still be appropriate). Symptomatic to VOR in standing, trial in walking   [employer], PT 11/23/2023, 3:27 PM  [facility] at [facility] [address] [place], [address] [place], [place] [address] Phone # [phone] Fax # [phone]

## 2023-11-23 NOTE — Assessment & Plan Note (Signed)
 Assessment: BP is controlled in office BP 132/52 mmHg; (second reading) Continues to have episodes of dizziness/vertigo Tolerates valsartan  320 mg qhs, hydralazine  25 mg tid, amlodipine  5 mg qam, spironolactone  25 mg qd well, without any side effects Denies SOB, palpitation, chest pain, headaches,or swelling Reiterated the importance of regular exercise and low salt diet   Plan:  Continue taking valsartan  320 mg qhs, hydralazine  25 mg tid, amlodipine  5 mg qam, spironolactone  25 mg qd Will see if we can get dexamethasone  suppression test, (need to determine if we can order dexamethasone  level) and aldosterone labs

## 2023-11-25 ENCOUNTER — Encounter

## 2023-11-26 ENCOUNTER — Ambulatory Visit

## 2023-12-03 ENCOUNTER — Ambulatory Visit

## 2023-12-03 ENCOUNTER — Ambulatory Visit (INDEPENDENT_AMBULATORY_CARE_PROVIDER_SITE_OTHER): Admitting: *Deleted

## 2023-12-03 VITALS — BP 158/73 | HR 59 | Temp 98.3°F | Resp 12 | Ht 69.0 in | Wt 196.4 lb

## 2023-12-03 DIAGNOSIS — E782 Mixed hyperlipidemia: Secondary | ICD-10-CM

## 2023-12-03 MED ORDER — INCLISIRAN SODIUM 284 MG/1.5ML ~~LOC~~ SOSY
284.0000 mg | PREFILLED_SYRINGE | Freq: Once | SUBCUTANEOUS | Status: AC
  Administered 2023-12-03: 284 mg via SUBCUTANEOUS
  Filled 2023-12-03: qty 1.5

## 2023-12-03 NOTE — Progress Notes (Signed)
 Diagnosis: Hyperlipidemia  Provider:  Chilton Greathouse MD  Procedure: Injection  Leqvio (inclisiran), Dose: 284 mg, Site: subcutaneous, Number of injections: 1  Injection Site(s): Right arm  Post Care: Observation period completed  Discharge: Condition: Good, Destination: Home . AVS Declined  Performed by:  Forrest Moron, RN

## 2023-12-20 ENCOUNTER — Telehealth: Payer: Self-pay | Admitting: Pharmacist Clinician (PhC)/ Clinical Pharmacy Specialist

## 2023-12-20 DIAGNOSIS — I10 Essential (primary) hypertension: Secondary | ICD-10-CM

## 2023-12-20 NOTE — Telephone Encounter (Signed)
 Dexamethasone  suppression test ordered.  Pt notified

## 2024-01-05 ENCOUNTER — Other Ambulatory Visit: Payer: Self-pay | Admitting: Cardiovascular Disease

## 2024-01-06 NOTE — Telephone Encounter (Signed)
 error

## 2024-01-20 ENCOUNTER — Telehealth: Payer: Self-pay

## 2024-01-20 NOTE — Telephone Encounter (Signed)
 Auth Submission: NO AUTH NEEDED Site of care: Site of care: [facility] Payer: Medicare A/B with AARP supplement Medication & CPT/J Code(s) submitted: Leqvio  (Inclisiran) J1306 Diagnosis Code:  Route of submission (phone, fax, portal):  Phone # Fax # Auth type: Buy/Bill PB Units/visits requested: 284mg  x 2 doses Reference number:  Approval from: 01/20/24 to 02/18/25

## 2024-01-27 ENCOUNTER — Other Ambulatory Visit: Payer: Self-pay | Admitting: Cardiovascular Disease

## 2024-06-02 ENCOUNTER — Ambulatory Visit
